# Patient Record
Sex: Female | Born: 1950
Health system: Southern US, Community
[De-identification: ages and names within clinical notes are randomized; demographics above are authoritative.]

## PROBLEM LIST (undated history)

## (undated) DIAGNOSIS — T7840XA Allergy, unspecified, initial encounter: Secondary | ICD-10-CM

## (undated) DIAGNOSIS — K219 Gastro-esophageal reflux disease without esophagitis: Secondary | ICD-10-CM

## (undated) DIAGNOSIS — M810 Age-related osteoporosis without current pathological fracture: Secondary | ICD-10-CM

## (undated) DIAGNOSIS — K648 Other hemorrhoids: Secondary | ICD-10-CM

## (undated) DIAGNOSIS — F419 Anxiety disorder, unspecified: Secondary | ICD-10-CM

## (undated) DIAGNOSIS — K760 Fatty (change of) liver, not elsewhere classified: Secondary | ICD-10-CM

## (undated) HISTORY — PX: COLONOSCOPY: SHX174

## (undated) HISTORY — DX: Allergy, unspecified, initial encounter: T78.40XA

## (undated) HISTORY — PX: ABDOMINAL HYSTERECTOMY: SHX81

## (undated) HISTORY — DX: Age-related osteoporosis without current pathological fracture: M81.0

## (undated) HISTORY — DX: Fatty (change of) liver, not elsewhere classified: K76.0

## (undated) HISTORY — DX: Other hemorrhoids: K64.8

---

## 1998-04-25 ENCOUNTER — Other Ambulatory Visit: Admission: RE | Admit: 1998-04-25 | Discharge: 1998-04-25 | Payer: Self-pay | Admitting: Gastroenterology

## 2000-05-03 ENCOUNTER — Other Ambulatory Visit: Admission: RE | Admit: 2000-05-03 | Discharge: 2000-05-03 | Payer: Self-pay | Admitting: *Deleted

## 2000-12-12 ENCOUNTER — Other Ambulatory Visit: Admission: RE | Admit: 2000-12-12 | Discharge: 2000-12-12 | Payer: Self-pay | Admitting: *Deleted

## 2017-06-23 ENCOUNTER — Emergency Department (HOSPITAL_COMMUNITY)
Admission: EM | Admit: 2017-06-23 | Discharge: 2017-06-24 | Disposition: A | Discharge status: Home / Self Care | Payer: BLUE CROSS/BLUE SHIELD | Attending: Emergency Medicine | Admitting: Emergency Medicine

## 2017-06-23 ENCOUNTER — Emergency Department (HOSPITAL_COMMUNITY): Payer: BLUE CROSS/BLUE SHIELD

## 2017-06-23 ENCOUNTER — Encounter (HOSPITAL_COMMUNITY): Payer: Self-pay | Admitting: Emergency Medicine

## 2017-06-23 DIAGNOSIS — W101XXA Fall (on)(from) sidewalk curb, initial encounter: Secondary | ICD-10-CM | POA: Insufficient documentation

## 2017-06-23 DIAGNOSIS — S99912A Unspecified injury of left ankle, initial encounter: Secondary | ICD-10-CM | POA: Diagnosis present

## 2017-06-23 DIAGNOSIS — Y998 Other external cause status: Secondary | ICD-10-CM | POA: Insufficient documentation

## 2017-06-23 DIAGNOSIS — Z87891 Personal history of nicotine dependence: Secondary | ICD-10-CM | POA: Insufficient documentation

## 2017-06-23 DIAGNOSIS — Y92480 Sidewalk as the place of occurrence of the external cause: Secondary | ICD-10-CM | POA: Insufficient documentation

## 2017-06-23 DIAGNOSIS — S82852A Displaced trimalleolar fracture of left lower leg, initial encounter for closed fracture: Secondary | ICD-10-CM | POA: Diagnosis not present

## 2017-06-23 DIAGNOSIS — W19XXXA Unspecified fall, initial encounter: Secondary | ICD-10-CM

## 2017-06-23 DIAGNOSIS — Y9301 Activity, walking, marching and hiking: Secondary | ICD-10-CM | POA: Diagnosis not present

## 2017-06-23 MED ORDER — PROPOFOL 10 MG/ML IV BOLUS
0.5000 mg/kg | Freq: Once | INTRAVENOUS | Status: DC
  Filled 2017-06-23: qty 20

## 2017-06-23 MED ORDER — MORPHINE SULFATE (PF) 4 MG/ML IV SOLN
6.0000 mg | Freq: Once | INTRAVENOUS | Status: AC
  Administered 2017-06-23: 6 mg via INTRAVENOUS
  Filled 2017-06-23: qty 2

## 2017-06-23 MED ORDER — HYDROCODONE-ACETAMINOPHEN 5-325 MG PO TABS: 1.0000 | ORAL_TABLET | ORAL | 0 refills | Status: DC | PRN

## 2017-06-23 MED ORDER — PROPOFOL 10 MG/ML IV BOLUS
INTRAVENOUS | Status: AC | PRN
  Administered 2017-06-23: 40 mg via INTRAVENOUS
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
## 2017-06-23 NOTE — Discharge Instructions (Signed)
For severe pain take norco or vicodin however realize they have the potential for addiction and it can make you sleepy and has tylenol in it.  No operating machinery while taking. If you were given medicines take as directed.  If you are on coumadin or contraceptives realize their levels and effectiveness is altered by many different medicines.  If you have any reaction (rash, tongues swelling, other) to the medicines stop taking and see a physician.    If your blood pressure was elevated in the ER make sure you follow up for management with a primary doctor or return for chest pain, shortness of breath or stroke symptoms.  Please follow up as directed and return to the ER or see a physician for new or worsening symptoms.  Thank you. Vitals:   06/23/17 2245 06/23/17 2310 06/23/17 2315 06/23/17 2320  BP: 138/83  130/84 (!) 152/92  Pulse: 84 85 89 87  Resp:  (!) 24 (!) 25 17  SpO2: 98% 95% 100% 99%  Weight:      Height:
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
## 2017-06-24 ENCOUNTER — Emergency Department (HOSPITAL_COMMUNITY): Payer: BLUE CROSS/BLUE SHIELD

## 2017-06-24 MED ORDER — HYDROCODONE-ACETAMINOPHEN 5-325 MG PO TABS
1.0000 | ORAL_TABLET | Freq: Once | ORAL | Status: AC
Start: 2017-06-24 — End: 2017-06-24
  Administered 2017-06-24: 1 via ORAL
  Filled 2017-06-24: qty 1

## 2017-07-04 NOTE — Pre-Procedure Instructions (Signed)
Lisa Spence  07/04/2017     No Pharmacies Listed   Your procedure is scheduled on Monday October 15.  Report to [facility] Admitting at 10:30 A.M.  Call this number if you have problems the morning of surgery:  [phone]   Remember:  Do not eat food or drink liquids after midnight.  Take these medicines the morning of surgery with A SIP OF WATER: hydrocodone-acetaminophen ([place]) if needed  7 days prior to surgery STOP taking any Aspirin (unless otherwise instructed by your surgeon), Aleve, Naproxen, Ibuprofen, Motrin, Advil, Goody's, BC's, all herbal medications, fish oil, and all vitamins    Do not wear jewelry, make-up or nail polish.  Do not wear lotions, powders, or perfumes, or deoderant.  Do not shave 48 hours prior to surgery.  Men may shave face and neck.  Do not bring valuables to the hospital.  [facility] is not responsible for any belongings or valuables.  Contacts, dentures or bridgework may not be worn into surgery.  Leave your suitcase in the car.  After surgery it may be brought to your room.  For patients admitted to the hospital, discharge time will be determined by your treatment team.  Patients discharged the day of surgery will not be allowed to drive home.   Special instructions:    [place]- Preparing For Surgery  Before surgery, you can play an important role. Because skin is not sterile, your skin needs to be as free of germs as possible. You can reduce the number of germs on your skin by washing with CHG (chlorahexidine gluconate) Soap before surgery.  CHG is an antiseptic cleaner which kills germs and bonds with the skin to continue killing germs even after washing.  Please do not use if you have an allergy to CHG or antibacterial soaps. If your skin becomes reddened/irritated stop using the CHG.  Do not shave (including legs and underarms) for at least 48 hours prior to first CHG shower. It is OK to shave your face.  Please  follow these instructions carefully.   1. Shower the NIGHT BEFORE SURGERY and the MORNING OF SURGERY with CHG.   2. If you chose to wash your hair, wash your hair first as usual with your normal shampoo.  3. After you shampoo, rinse your hair and body thoroughly to remove the shampoo.  4. Use CHG as you would any other liquid soap. You can apply CHG directly to the skin and wash gently with a scrungie or a clean washcloth.   5. Apply the CHG Soap to your body ONLY FROM THE NECK DOWN.  Do not use on open wounds or open sores. Avoid contact with your eyes, ears, mouth and genitals (private parts). Wash genitals (private parts) with your normal soap.  6. Wash thoroughly, paying special attention to the area where your surgery will be performed.  7. Thoroughly rinse your body with warm water from the neck down.  8. DO NOT shower/wash with your normal soap after using and rinsing off the CHG Soap.  9. Pat yourself dry with a CLEAN TOWEL.  10. Wear CLEAN PAJAMAS to bed the night before surgery, wear comfortable clothes the morning of surgery  11. Place CLEAN SHEETS on your bed the night of your first shower and DO NOT SLEEP WITH PETS.    Day of Surgery: Do not apply any deodorants/lotions. Please wear clean clothes to the hospital/surgery center.      Please read over  the following fact sheets that you were given. Coughing and Deep Breathing and MRSA Information

## 2017-07-05 ENCOUNTER — Encounter (HOSPITAL_COMMUNITY)
Admission: RE | Admit: 2017-07-05 | Discharge: 2017-07-05 | Disposition: A | Discharge status: Home / Self Care | Payer: BLUE CROSS/BLUE SHIELD | Source: Ambulatory Visit | Attending: Orthopedic Surgery | Admitting: Orthopedic Surgery

## 2017-07-05 ENCOUNTER — Encounter (HOSPITAL_COMMUNITY): Payer: Self-pay

## 2017-07-05 DIAGNOSIS — Z01818 Encounter for other preprocedural examination: Secondary | ICD-10-CM | POA: Insufficient documentation

## 2017-07-05 HISTORY — DX: Anxiety disorder, unspecified: F41.9

## 2017-07-05 HISTORY — DX: Gastro-esophageal reflux disease without esophagitis: K21.9

## 2017-07-05 LAB — CBC
HEMATOCRIT: 43.7 % (ref 36.0–46.0)
Hemoglobin: 14.6 g/dL (ref 12.0–15.0)
MCH: 29.4 pg (ref 26.0–34.0)
MCHC: 33.4 g/dL (ref 30.0–36.0)
MCV: 88.1 fL (ref 78.0–100.0)
PLATELETS: 290 10*3/uL (ref 150–400)
RBC: 4.96 MIL/uL (ref 3.87–5.11)
RDW: 12.5 % (ref 11.5–15.5)
WBC: 9.5 10*3/uL (ref 4.0–10.5)

## 2017-07-05 LAB — SURGICAL PCR SCREEN
MRSA, PCR: NEGATIVE
STAPHYLOCOCCUS AUREUS: NEGATIVE

## 2017-07-05 NOTE — Progress Notes (Signed)
PCP - Laurey Morale in [place]  Pt denies cardiac issues/cardiac hx  Patient denies shortness of breath, fever, cough and chest pain at PAT appointment   Patient verbalized understanding of instructions that were given to them at the PAT appointment. Patient was also instructed that they will need to review over the PAT instructions again at home before surgery.

## 2017-07-09 ENCOUNTER — Inpatient Hospital Stay (HOSPITAL_COMMUNITY)
Admission: RE | Admit: 2017-07-09 | Discharge: 2017-07-11 | DRG: 494 | Disposition: A | Discharge status: Home / Self Care | Payer: BLUE CROSS/BLUE SHIELD | Source: Ambulatory Visit | Attending: Orthopedic Surgery | Admitting: Orthopedic Surgery

## 2017-07-09 ENCOUNTER — Inpatient Hospital Stay (HOSPITAL_COMMUNITY): Payer: BLUE CROSS/BLUE SHIELD | Admitting: Anesthesiology

## 2017-07-09 ENCOUNTER — Encounter (HOSPITAL_COMMUNITY): Payer: Self-pay

## 2017-07-09 ENCOUNTER — Encounter (HOSPITAL_COMMUNITY)
Admission: RE | Disposition: A | Discharge status: Home / Self Care | Payer: Self-pay | Source: Ambulatory Visit | Attending: Orthopedic Surgery

## 2017-07-09 ENCOUNTER — Inpatient Hospital Stay (HOSPITAL_COMMUNITY): Payer: BLUE CROSS/BLUE SHIELD

## 2017-07-09 DIAGNOSIS — K219 Gastro-esophageal reflux disease without esophagitis: Secondary | ICD-10-CM | POA: Diagnosis present

## 2017-07-09 DIAGNOSIS — F419 Anxiety disorder, unspecified: Secondary | ICD-10-CM | POA: Diagnosis present

## 2017-07-09 DIAGNOSIS — Z419 Encounter for procedure for purposes other than remedying health state, unspecified: Secondary | ICD-10-CM

## 2017-07-09 DIAGNOSIS — W101XXA Fall (on)(from) sidewalk curb, initial encounter: Secondary | ICD-10-CM | POA: Diagnosis present

## 2017-07-09 DIAGNOSIS — M25572 Pain in left ankle and joints of left foot: Secondary | ICD-10-CM | POA: Diagnosis present

## 2017-07-09 DIAGNOSIS — S82852A Displaced trimalleolar fracture of left lower leg, initial encounter for closed fracture: Secondary | ICD-10-CM | POA: Diagnosis present

## 2017-07-09 DIAGNOSIS — Z87891 Personal history of nicotine dependence: Secondary | ICD-10-CM

## 2017-07-09 DIAGNOSIS — Z79899 Other long term (current) drug therapy: Secondary | ICD-10-CM

## 2017-07-09 HISTORY — PX: ORIF ANKLE FRACTURE: SHX5408

## 2017-07-09 LAB — CREATININE, SERUM
CREATININE: 0.84 mg/dL (ref 0.44–1.00)
GFR calc Af Amer: >60 mL/min (ref >60)

## 2017-07-09 LAB — CBC
HCT: 45.9 % (ref 36.0–46.0)
Hemoglobin: 15.7 g/dL — ABNORMAL HIGH (ref 12.0–15.0)
MCH: 30.4 pg (ref 26.0–34.0)
MCHC: 34.2 g/dL (ref 30.0–36.0)
MCV: 88.8 fL (ref 78.0–100.0)
Platelets: 260 10*3/uL (ref 150–400)
RBC: 5.17 MIL/uL — ABNORMAL HIGH (ref 3.87–5.11)
RDW: 12.2 % (ref 11.5–15.5)
WBC: 9 10*3/uL (ref 4.0–10.5)

## 2017-07-09 SURGERY — OPEN REDUCTION INTERNAL FIXATION (ORIF) ANKLE FRACTURE
Anesthesia: General | Laterality: Left

## 2017-07-09 MED ORDER — ENOXAPARIN SODIUM 40 MG/0.4ML ~~LOC~~ SOLN
40.0000 mg | SUBCUTANEOUS | Status: DC
  Administered 2017-07-10 – 2017-07-11 (×2): 40 mg via SUBCUTANEOUS
  Filled 2017-07-09 (×2): qty 0.4

## 2017-07-09 MED ORDER — MIDAZOLAM HCL 2 MG/2ML IJ SOLN
INTRAMUSCULAR | Status: AC
  Administered 2017-07-09: 2 mg via INTRAVENOUS
  Filled 2017-07-09: qty 2

## 2017-07-09 MED ORDER — DIPHENHYDRAMINE HCL 50 MG/ML IJ SOLN
INTRAMUSCULAR | Status: AC
  Filled 2017-07-09: qty 1

## 2017-07-09 MED ORDER — BUPIVACAINE HCL (PF) 0.25 % IJ SOLN
INTRAMUSCULAR | Status: AC
  Filled 2017-07-09: qty 30

## 2017-07-09 MED ORDER — FENTANYL CITRATE (PF) 100 MCG/2ML IJ SOLN
100.0000 ug | Freq: Once | INTRAMUSCULAR | Status: AC
  Administered 2017-07-09: 100 ug via INTRAVENOUS

## 2017-07-09 MED ORDER — MIDAZOLAM HCL 2 MG/2ML IJ SOLN
2.0000 mg | Freq: Once | INTRAMUSCULAR | Status: AC
  Administered 2017-07-09: 2 mg via INTRAVENOUS

## 2017-07-09 MED ORDER — VITAMIN C 500 MG PO TABS
500.0000 mg | ORAL_TABLET | Freq: Every day | ORAL | Status: DC
  Administered 2017-07-10 – 2017-07-11 (×2): 500 mg via ORAL
  Filled 2017-07-09 (×2): qty 1

## 2017-07-09 MED ORDER — TRAMADOL HCL 50 MG PO TABS
50.0000 mg | ORAL_TABLET | Freq: Four times a day (QID) | ORAL | Status: DC | PRN
  Filled 2017-07-09: qty 1

## 2017-07-09 MED ORDER — PROPOFOL 10 MG/ML IV BOLUS
INTRAVENOUS | Status: AC
  Filled 2017-07-09: qty 20

## 2017-07-09 MED ORDER — MORPHINE SULFATE (PF) 4 MG/ML IV SOLN
2.0000 mg | INTRAVENOUS | Status: DC | PRN
  Administered 2017-07-10: 2 mg via INTRAVENOUS
  Filled 2017-07-09: qty 1

## 2017-07-09 MED ORDER — TRAMADOL HCL 50 MG PO TABS
50.0000 mg | ORAL_TABLET | Freq: Four times a day (QID) | ORAL | Status: DC | PRN
  Administered 2017-07-09: 100 mg via ORAL
  Filled 2017-07-09: qty 1

## 2017-07-09 MED ORDER — ONDANSETRON HCL 4 MG/2ML IJ SOLN
INTRAMUSCULAR | Status: DC | PRN
  Administered 2017-07-09: 4 mg via INTRAVENOUS

## 2017-07-09 MED ORDER — CHLORHEXIDINE GLUCONATE 4 % EX LIQD: 60.0000 mL | Freq: Once | CUTANEOUS | Status: DC

## 2017-07-09 MED ORDER — PANTOPRAZOLE SODIUM 20 MG PO TBEC: 20.0000 mg | DELAYED_RELEASE_TABLET | Freq: Every day | ORAL | Status: DC | PRN

## 2017-07-09 MED ORDER — ACETAMINOPHEN 325 MG PO TABS: 650.0000 mg | ORAL_TABLET | Freq: Four times a day (QID) | ORAL | Status: DC | PRN

## 2017-07-09 MED ORDER — HYDROCODONE-ACETAMINOPHEN 5-325 MG PO TABS
1.0000 | ORAL_TABLET | ORAL | Status: DC | PRN
  Administered 2017-07-10 – 2017-07-11 (×7): 2 via ORAL
  Filled 2017-07-09 (×7): qty 2

## 2017-07-09 MED ORDER — CEFAZOLIN SODIUM-DEXTROSE 2-4 GM/100ML-% IV SOLN
INTRAVENOUS | Status: AC
  Filled 2017-07-09: qty 100

## 2017-07-09 MED ORDER — BUPIVACAINE-EPINEPHRINE (PF) 0.5% -1:200000 IJ SOLN
INTRAMUSCULAR | Status: DC | PRN
  Administered 2017-07-09: 30 mL via PERINEURAL

## 2017-07-09 MED ORDER — ONDANSETRON HCL 4 MG/2ML IJ SOLN: 4.0000 mg | Freq: Four times a day (QID) | INTRAMUSCULAR | Status: DC | PRN

## 2017-07-09 MED ORDER — MIDAZOLAM HCL 2 MG/2ML IJ SOLN
INTRAMUSCULAR | Status: AC
  Filled 2017-07-09: qty 2

## 2017-07-09 MED ORDER — SENNA 8.6 MG PO TABS
1.0000 | ORAL_TABLET | Freq: Two times a day (BID) | ORAL | Status: DC
  Administered 2017-07-09 – 2017-07-11 (×4): 8.6 mg via ORAL
  Filled 2017-07-09 (×4): qty 1

## 2017-07-09 MED ORDER — ONDANSETRON HCL 4 MG PO TABS: 4.0000 mg | ORAL_TABLET | Freq: Four times a day (QID) | ORAL | Status: DC | PRN

## 2017-07-09 MED ORDER — MORPHINE SULFATE (PF) 2 MG/ML IV SOLN: 2.0000 mg | INTRAVENOUS | Status: DC | PRN

## 2017-07-09 MED ORDER — DIPHENHYDRAMINE HCL 50 MG/ML IJ SOLN
INTRAMUSCULAR | Status: DC | PRN
  Administered 2017-07-09: 12.5 mg via INTRAVENOUS

## 2017-07-09 MED ORDER — FENTANYL CITRATE (PF) 100 MCG/2ML IJ SOLN
INTRAMUSCULAR | Status: AC
  Administered 2017-07-09: 100 ug via INTRAVENOUS
  Filled 2017-07-09: qty 2

## 2017-07-09 MED ORDER — PROPOFOL 10 MG/ML IV BOLUS
INTRAVENOUS | Status: DC | PRN
  Administered 2017-07-09: 180 mg via INTRAVENOUS

## 2017-07-09 MED ORDER — ONDANSETRON HCL 4 MG/2ML IJ SOLN
INTRAMUSCULAR | Status: AC
  Filled 2017-07-09: qty 2

## 2017-07-09 MED ORDER — ACETAMINOPHEN 650 MG RE SUPP: 650.0000 mg | Freq: Four times a day (QID) | RECTAL | Status: DC | PRN

## 2017-07-09 MED ORDER — DEXAMETHASONE SODIUM PHOSPHATE 10 MG/ML IJ SOLN
INTRAMUSCULAR | Status: DC | PRN
  Administered 2017-07-09: 10 mg via INTRAVENOUS

## 2017-07-09 MED ORDER — LACTATED RINGERS IV SOLN
INTRAVENOUS | Status: DC
  Administered 2017-07-09: 11:00:00 via INTRAVENOUS

## 2017-07-09 MED ORDER — HYDROMORPHONE HCL 1 MG/ML IJ SOLN: 0.2500 mg | INTRAMUSCULAR | Status: DC | PRN

## 2017-07-09 MED ORDER — CEFAZOLIN SODIUM-DEXTROSE 2-4 GM/100ML-% IV SOLN
2.0000 g | INTRAVENOUS | Status: AC
  Administered 2017-07-09: 2 g via INTRAVENOUS

## 2017-07-09 MED ORDER — DEXAMETHASONE SODIUM PHOSPHATE 10 MG/ML IJ SOLN
INTRAMUSCULAR | Status: AC
  Filled 2017-07-09: qty 1

## 2017-07-09 MED ORDER — CLONAZEPAM 0.5 MG PO TABS
0.5000 mg | ORAL_TABLET | Freq: Two times a day (BID) | ORAL | Status: DC | PRN
  Administered 2017-07-09 – 2017-07-11 (×4): 1 mg via ORAL
  Filled 2017-07-09 (×4): qty 2

## 2017-07-09 MED ORDER — PROMETHAZINE HCL 25 MG/ML IJ SOLN
6.2500 mg | INTRAMUSCULAR | Status: DC | PRN
Start: 2017-07-09 — End: 2017-07-09

## 2017-07-09 MED ORDER — FENTANYL CITRATE (PF) 250 MCG/5ML IJ SOLN
INTRAMUSCULAR | Status: AC
  Filled 2017-07-09: qty 5

## 2017-07-09 SURGICAL SUPPLY — 66 items
BANDAGE ACE 4X5 VEL STRL LF (GAUZE/BANDAGES/DRESSINGS) ×2 IMPLANT
BANDAGE ACE 6X5 VEL STRL LF (GAUZE/BANDAGES/DRESSINGS) ×2 IMPLANT
BANDAGE ESMARK 6X9 LF (GAUZE/BANDAGES/DRESSINGS) IMPLANT
BIT DRILL 2.5X110 QC LCP DISP (BIT) ×2 IMPLANT
BIT DRILL CANN 2.7X625 NONSTRL (BIT) ×2 IMPLANT
BNDG CMPR 9X6 STRL LF SNTH (GAUZE/BANDAGES/DRESSINGS)
BNDG COHESIVE 4X5 TAN STRL (GAUZE/BANDAGES/DRESSINGS) ×3 IMPLANT
BNDG ESMARK 6X9 LF (GAUZE/BANDAGES/DRESSINGS)
CANISTER SUCT 3000ML PPV (MISCELLANEOUS) ×3 IMPLANT
COVER SURGICAL LIGHT HANDLE (MISCELLANEOUS) ×3 IMPLANT
CUFF TOURNIQUET SINGLE 34IN LL (TOURNIQUET CUFF) ×2 IMPLANT
DRAPE C-ARM 42X72 X-RAY (DRAPES) ×3 IMPLANT
DRAPE C-ARMOR (DRAPES) ×3 IMPLANT
DRAPE U-SHAPE 47X51 STRL (DRAPES) ×3 IMPLANT
DRSG ADAPTIC 3X8 NADH LF (GAUZE/BANDAGES/DRESSINGS) ×3 IMPLANT
DRSG PAD ABDOMINAL 8X10 ST (GAUZE/BANDAGES/DRESSINGS) ×5 IMPLANT
DURAPREP 26ML APPLICATOR (WOUND CARE) ×3 IMPLANT
ELECT REM PT RETURN 9FT ADLT (ELECTROSURGICAL) ×3
ELECTRODE REM PT RTRN 9FT ADLT (ELECTROSURGICAL) ×1 IMPLANT
GAUZE SPONGE 4X4 12PLY STRL (GAUZE/BANDAGES/DRESSINGS) ×3 IMPLANT
GLOVE BIO SURGEON STRL SZ7.5 (GLOVE) ×3 IMPLANT
GLOVE BIOGEL PI IND STRL 7.0 (GLOVE) IMPLANT
GLOVE BIOGEL PI IND STRL 8 (GLOVE) ×1 IMPLANT
GLOVE BIOGEL PI INDICATOR 7.0 (GLOVE) ×2
GLOVE BIOGEL PI INDICATOR 8 (GLOVE) ×2
GLOVE SURG SS PI 6.5 STRL IVOR (GLOVE) ×2 IMPLANT
GOWN STRL REUS W/ TWL LRG LVL3 (GOWN DISPOSABLE) ×2 IMPLANT
GOWN STRL REUS W/ TWL XL LVL3 (GOWN DISPOSABLE) ×1 IMPLANT
GOWN STRL REUS W/TWL LRG LVL3 (GOWN DISPOSABLE) ×6
GOWN STRL REUS W/TWL XL LVL3 (GOWN DISPOSABLE) ×3
GUIDEWIRE THREADED 150MM (WIRE) ×2 IMPLANT
KIT BASIN OR (CUSTOM PROCEDURE TRAY) ×3 IMPLANT
KIT ROOM TURNOVER OR (KITS) ×3 IMPLANT
NDL HYPO 25GX1X1/2 BEV (NEEDLE) IMPLANT
NEEDLE HYPO 25GX1X1/2 BEV (NEEDLE) IMPLANT
NS IRRIG 1000ML POUR BTL (IV SOLUTION) ×3 IMPLANT
PACK ORTHO EXTREMITY (CUSTOM PROCEDURE TRAY) ×3 IMPLANT
PAD ARMBOARD 7.5X6 YLW CONV (MISCELLANEOUS) ×6 IMPLANT
PAD CAST 4YDX4 CTTN HI CHSV (CAST SUPPLIES) ×2 IMPLANT
PADDING CAST COTTON 4X4 STRL (CAST SUPPLIES) ×6
PADDING CAST COTTON 6X4 STRL (CAST SUPPLIES) ×3 IMPLANT
PLATE LCP 3.5 1/3 TUB 8HX93 (Plate) ×2 IMPLANT
SCREW CANC FT ST SFS 4X16 (Screw) ×2 IMPLANT
SCREW CANC FT/18 4.0 (Screw) ×2 IMPLANT
SCREW CORTEX 3.5 12MM (Screw) ×6 IMPLANT
SCREW CORTEX 3.5 18MM (Screw) ×2 IMPLANT
SCREW CORTEX 3.5 20MM (Screw) ×2 IMPLANT
SCREW LOCK CORT ST 3.5X12 (Screw) IMPLANT
SCREW LOCK CORT ST 3.5X18 (Screw) IMPLANT
SCREW LOCK CORT ST 3.5X20 (Screw) IMPLANT
SCREW LOCK T15 FT 16X3.5X2.9X (Screw) IMPLANT
SCREW LOCKING 3.5X16 (Screw) ×3 IMPLANT
SCREW SHORT THREAD 4.0X40 (Screw) ×4 IMPLANT
SPONGE LAP 18X18 X RAY DECT (DISPOSABLE) IMPLANT
SUCTION FRAZIER HANDLE 10FR (MISCELLANEOUS) ×2
SUCTION TUBE FRAZIER 10FR DISP (MISCELLANEOUS) ×1 IMPLANT
SUT ETHILON 3 0 PS 1 (SUTURE) ×7 IMPLANT
SUT VIC AB 0 CT1 27 (SUTURE) ×3
SUT VIC AB 0 CT1 27XBRD ANBCTR (SUTURE) IMPLANT
SUT VIC AB 2-0 CT1 27 (SUTURE) ×6
SUT VIC AB 2-0 CT1 TAPERPNT 27 (SUTURE) ×1 IMPLANT
SYR CONTROL 10ML LL (SYRINGE) IMPLANT
TOWEL OR 17X24 6PK STRL BLUE (TOWEL DISPOSABLE) ×3 IMPLANT
TOWEL OR 17X26 10 PK STRL BLUE (TOWEL DISPOSABLE) ×6 IMPLANT
TUBE CONNECTING 12'X1/4 (SUCTIONS) ×1
TUBE CONNECTING 12X1/4 (SUCTIONS) ×2 IMPLANT

## 2017-07-09 NOTE — Anesthesia Procedure Notes (Signed)
Procedure Name: LMA Insertion Date/Time: 07/09/2017 12:35 PM Performed by: Sampson Si E Pre-anesthesia Checklist: Patient identified, Emergency Drugs available, Suction available and Patient being monitored Patient Re-evaluated:Patient Re-evaluated prior to induction Oxygen Delivery Method: Circle System Utilized Preoxygenation: Pre-oxygenation with 100% oxygen Induction Type: IV induction Ventilation: Mask ventilation without difficulty LMA: LMA inserted LMA Size: 4.0 Number of attempts: 1 Placement Confirmation: positive ETCO2 Tube secured with: Tape Dental Injury: Teeth and Oropharynx as per pre-operative assessment
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
## 2017-07-09 NOTE — Transfer of Care (Signed)
Immediate Anesthesia Transfer of Care Note  Patient: Lisa Spence  Procedure(s) Performed: OPEN REDUCTION INTERNAL FIXATION (ORIF) ANKLE FRACTURE (Left )  Patient Location: PACU  Anesthesia Type:General  Level of Consciousness: drowsy and patient cooperative  Airway & Oxygen Therapy: Patient Spontanous Breathing and Patient connected to nasal cannula oxygen  Post-op Assessment: Report given to RN  Post vital signs: Reviewed and stable  Last Vitals:  Vitals:   07/09/17 1205 07/09/17 1210  BP: (!) 142/73 (!) 163/91  Pulse: 86 87  Resp: 11 16  Temp:    SpO2: 97% 100%    Last Pain:  Vitals:   07/09/17 1019  TempSrc: Oral         Complications: No apparent anesthesia complications

## 2017-07-09 NOTE — Anesthesia Procedure Notes (Signed)
Anesthesia Regional Block: Popliteal block   Pre-Anesthetic Checklist: ,, timeout performed, Correct Patient, Correct Site, Correct Laterality, Correct Procedure, Correct Position, site marked, Risks and benefits discussed,  Surgical consent,  Pre-op evaluation,  At surgeon's request and post-op pain management  Laterality: Left  Prep: chloraprep       Needles:  Injection technique: Single-shot  Needle Type: Echogenic Stimulator Needle          Additional Needles:   Narrative:  Start time: 07/09/2017 11:56 AM End time: 07/09/2017 12:06 PM Injection made incrementally with aspirations every 5 mL.  Performed by: Personally  Anesthesiologist: Duane Boston  Additional Notes: A functioning IV was confirmed and monitors were applied.  Sterile prep and drape, hand hygiene and sterile gloves were used.  Negative aspiration and test dose prior to incremental administration of local anesthetic. The patient tolerated the procedure well.Ultrasound  guidance: relevant anatomy identified, needle position confirmed, local anesthetic spread visualized around nerve(s), vascular puncture avoided.  Image printed for medical record. ACB supplement.

## 2017-07-09 NOTE — Brief Op Note (Signed)
07/09/2017  2:18 PM  PATIENT:  Lisa Spence  66 y.o. female  PRE-OPERATIVE DIAGNOSIS:  Left trimalleolar ankle fracture  POST-OPERATIVE DIAGNOSIS:  Left trimalleolar ankle fracture  PROCEDURE:  Procedure(s) with comments: OPEN REDUCTION INTERNAL FIXATION (ORIF) ANKLE FRACTURE (Left) - 90 mins  SURGEON:  Surgeon(s) and Role:    * Stann Mainland, Elly Modena, MD - Primary  PHYSICIAN ASSISTANT:   ASSISTANTS: none   ANESTHESIA:   regional and general  EBL:  Total I/O In: 700 [I.V.:700] Out: 10 [Blood:10]  BLOOD ADMINISTERED:none  DRAINS: none   LOCAL MEDICATIONS USED:  NONE  SPECIMEN:  No Specimen  DISPOSITION OF SPECIMEN:  N/A  COUNTS:  YES  TOURNIQUET:   Total Tourniquet Time Documented: Thigh (Left) - 69 minutes Total: Thigh (Left) - 69 minutes   DICTATION: .Note written in EPIC  PLAN OF CARE: Admit for overnight observation  PATIENT DISPOSITION:  PACU - hemodynamically stable.   Delay start of Pharmacological VTE agent (>24hrs) due to surgical blood loss or risk of bleeding: not applicable

## 2017-07-09 NOTE — Anesthesia Postprocedure Evaluation (Signed)
Anesthesia Post Note  Patient: DEANDRA GOERING  Procedure(s) Performed: OPEN REDUCTION INTERNAL FIXATION (ORIF) ANKLE FRACTURE (Left )     Patient location during evaluation: PACU Anesthesia Type: General Level of consciousness: sedated Pain management: pain level controlled Vital Signs Assessment: post-procedure vital signs reviewed and stable Respiratory status: spontaneous breathing and respiratory function stable Cardiovascular status: stable Postop Assessment: no apparent nausea or vomiting Anesthetic complications: no    Last Vitals:  Vitals:   07/09/17 1210 07/09/17 1420  BP: (!) 163/91   Pulse: 87   Resp: 16   Temp:  36.7 C  SpO2: 100%     Last Pain:  Vitals:   07/09/17 1019  TempSrc: Oral                 Alford Gamero DANIEL

## 2017-07-09 NOTE — H&P (Signed)
ORTHOPAEDIC H and P  REQUESTING PHYSICIAN: Nicholes Stairs, MD  PCP:  Patient, No Pcp Per  Chief Complaint: Left ankle fracture  HPI: Lisa MONCEAUX is a 66 y.o. female who complains of left ankle fracture following a fall approximately 2 weeks ago.  She sustained a left trimalleolar fracture/dislocation.  This was treated with CRED and splinting.  She followed up with me in my office and was found to be in good alignment and we recommended delayed fixation to allow soft tissue rest.  She is here today for the surgery.  We have reviewed images and our operative plan in my office and there are no changes to her prior HPI.  She denies any new complaints today.  Past Medical History:  Diagnosis Date  . Anxiety   . GERD (gastroesophageal reflux disease)    Past Surgical History:  Procedure Laterality Date  . ABDOMINAL HYSTERECTOMY    . COLONOSCOPY     Social History   Social History  . Marital status: Single    Spouse name: N/A  . Number of children: N/A  . Years of education: N/A   Social History Main Topics  . Smoking status: Former Research scientist (life sciences)  . Smokeless tobacco: Never Used  . Alcohol use No     Comment: no longer drinks, drank 6 glasses wine/week stopped in 2007  . Drug use: No  . Sexual activity: No   Other Topics Concern  . None   Social History Narrative  . None   History reviewed. No pertinent family history. No Known Allergies Prior to Admission medications   Medication Sig Start Date End Date Taking? Authorizing Provider  Ascorbic Acid (VITAMIN C PO) Take 1 tablet by mouth daily.   Yes [provider]  clonazePAM (KLONOPIN) 0.5 MG tablet Take 0.5 mg by mouth 2 (two) times daily as needed for anxiety. 1-2 tablets twice daily as needed   Yes [provider]  HYDROcodone-acetaminophen (NORCO) 5-325 MG tablet Take 1-2 tablets by mouth every 4 (four) hours as needed. 06/23/17  Yes Elnora Morrison, MD  pantoprazole (PROTONIX) 20 MG tablet Take  20 mg by mouth daily as needed for heartburn.    Yes [provider]  Probiotic Product (PROBIOTIC PO) Take 1 tablet by mouth daily.   Yes [provider]  traMADol (ULTRAM) 50 MG tablet Take 50 mg by mouth every 6 (six) hours as needed. Takes 1-2 tablets every 6 hours as needed   Yes [provider]   No results found.  Positive ROS: All other systems have been reviewed and were otherwise negative with the exception of those mentioned in the HPI and as above.  Physical Exam: General: Alert, no acute distress Cardiovascular: No pedal edema Respiratory: No cyanosis, no use of accessory musculature GI: No organomegaly, abdomen is soft and non-tender Skin: No lesions in the area of chief complaint Neurologic: Sensation intact distally Psychiatric: Patient is competent for consent with normal mood and affect Lymphatic: No axillary or cervical lymphadenopathy  MUSCULOSKELETAL:  LLE- splint taken down anteriorly, +wrinkle sign noted and skin appropriate for surgery.  +SILT sp/dp/sur/saph/TN, motor in tact with TA/GSC/FHL/EHL, 2+ DP pulse  Assessment: Closed left ankle trimalleolar ankle fractur  Plan: - plan for OR today for ORIF - will admit to obs post op for pain control and PT - NWB post op - The risks, benefits, and alternatives were discussed with the patient. There are risks associated with the surgery including, but not limited to,  problems with anesthesia (death), infection, differences in leg length/angulation/rotation, fracture of bones, loosening or failure of implants, malunion, nonunion, hematoma (blood accumulation) which may require surgical drainage, blood clots, pulmonary embolism, nerve injury, and blood vessel injury. The patient understands these risks and elects to proceed.     Nicholes Stairs, MD Cell [phone]    07/09/2017 12:05 PM

## 2017-07-09 NOTE — Anesthesia Preprocedure Evaluation (Signed)
Anesthesia Evaluation  Patient identified by MRN, date of birth, ID band Patient awake    Reviewed: Allergy & Precautions, NPO status , Patient's Chart, lab work & pertinent test results  Airway Mallampati: II  TM Distance: >3 FB Neck ROM: Full    Dental no notable dental hx. (+) Dental Advisory Given   Pulmonary neg pulmonary ROS, former smoker,    Pulmonary exam normal        Cardiovascular negative cardio ROS Normal cardiovascular exam     Neuro/Psych PSYCHIATRIC DISORDERS Anxiety negative neurological ROS     GI/Hepatic Neg liver ROS, GERD  ,  Endo/Other  negative endocrine ROS  Renal/GU negative Renal ROS  negative genitourinary   Musculoskeletal negative musculoskeletal ROS (+)   Abdominal   Peds negative pediatric ROS (+)  Hematology negative hematology ROS (+)   Anesthesia Other Findings   Reproductive/Obstetrics negative OB ROS                             Anesthesia Physical Anesthesia Plan  ASA: II  Anesthesia Plan: General   Post-op Pain Management:    Induction: Intravenous  PONV Risk Score and Plan: 3 and Ondansetron, Dexamethasone and Diphenhydramine  Airway Management Planned: LMA  Additional Equipment:   Intra-op Plan:   Post-operative Plan: Extubation in OR  Informed Consent: I have reviewed the patients History and Physical, chart, labs and discussed the procedure including the risks, benefits and alternatives for the proposed anesthesia with the patient or authorized representative who has indicated his/her understanding and acceptance.   Dental advisory given  Plan Discussed with: CRNA, Anesthesiologist and Surgeon  Anesthesia Plan Comments:         Anesthesia Quick Evaluation

## 2017-07-10 LAB — CBC
HCT: 41.8 % (ref 36.0–46.0)
HEMOGLOBIN: 13.8 g/dL (ref 12.0–15.0)
MCH: 29.1 pg (ref 26.0–34.0)
MCHC: 33 g/dL (ref 30.0–36.0)
MCV: 88.2 fL (ref 78.0–100.0)
PLATELETS: 297 10*3/uL (ref 150–400)
RBC: 4.74 MIL/uL (ref 3.87–5.11)
RDW: 12.2 % (ref 11.5–15.5)
WBC: 12.8 10*3/uL — ABNORMAL HIGH (ref 4.0–10.5)

## 2017-07-10 LAB — HIV ANTIBODY (ROUTINE TESTING W REFLEX): HIV Screen 4th Generation wRfx: NONREACTIVE

## 2017-07-10 NOTE — Evaluation (Addendum)
Physical Therapy Evaluation and Discharge Patient Details Name: Lisa Spence MRN: [id number] DOB: May 01, 1951 Today's Date: 07/10/2017   History of Present Illness  Pt is a 66 y.o. female s/p left ankle ORIF due to a left trimalleolar fracture/dislocation from a fall. PMH: Anxiety, GERD.   Clinical Impression  Patient evaluated by Physical Therapy with no further acute PT needs identified. All education has been completed and the patient has no further questions. At the time of PT eval pt was able to negotiate around room with crutches without difficulty. She reports using crutches for the past 2 weeks at home and states she has been managing well. Has been sitting and bumping up on ~3 stairs to get from one part of the house to another - has ramp and wheelchair to enter home. Pt was instructed on negotiating stairs backwards with crutches as well - pt was able to complete with close guard only for safety; no physical assist required. As pt has been managing well at home maintaining a NWB status, and is not requiring assist for mobility post-op, feel she is appropriate to return home from a PT standpoint until the MD clears her for outpatient physical therapy follow-up. See below for any follow-up Physical Therapy or equipment needs. PT is signing off. Thank you for this referral.     Follow Up Recommendations Outpatient PT (When appropriate per post-op protocol. MD to order.)    Equipment Recommendations  None recommended by PT    Recommendations for Other Services       Precautions / Restrictions Precautions Precautions: Fall Restrictions Weight Bearing Restrictions: Yes LLE Weight Bearing: Non weight bearing      Mobility  Bed Mobility Overal bed mobility: Modified Independent             General bed mobility comments: No assist required. Pt transitioned to EOB without rails, and HOB flat to simulate home environment.   Transfers Overall transfer level: Modified  independent Equipment used: Crutches Transfers: Sit to/from Stand Sit to Stand: Modified independent (Device/Increase time)         General transfer comment: Pt able to maintain NWB during transfer. Balanced on RLE well while positioning crutches. No assist required, and no unsteadiness noted.   Ambulation/Gait Ambulation/Gait assistance: Modified independent (Device/Increase time) Ambulation Distance (Feet): 25 Feet Assistive device: Crutches Gait Pattern/deviations: Step-to pattern Gait velocity: Decreased Gait velocity interpretation: Below normal speed for age/gender General Gait Details: Pt about to ambulate around room well with crutches. No LOB noted. No assist required.   Stairs Stairs: Yes Stairs assistance: Min guard Stair Management: With crutches;Backwards Number of Stairs: 3 (1x3) General stair comments: VC's for sequencing and general safety with stair negotiation. Pt reports she has been sitting down and bumping up to top step, and has been able to transition to sitting position without weight bearing at all. We practiced negotiating stairs with crutches and she was able to complete well.   Wheelchair Mobility    Modified Rankin (Stroke Patients Only)       Balance Overall balance assessment: Needs assistance Sitting-balance support: No upper extremity supported;Feet supported Sitting balance-Leahy Scale: Good     Standing balance support: Bilateral upper extremity supported;During functional activity Standing balance-Leahy Scale: Fair                               Pertinent Vitals/Pain Pain Assessment: No/denies pain (Reports still numb from knee down)  Home Living Family/patient expects to be discharged to:: Private residence Living Arrangements: Non-relatives/Friends Available Help at Discharge: Friend(s);Family;Available 24 hours/day Type of Home: House Home Access: Ramped entrance     Home Layout: Two level Home Equipment:  Crutches;Shower seat;Wheelchair - manual      Prior Function Level of Independence: Independent with assistive device(s)         Comments: Pt wroks full time and used crutches for two weeks PTA.     Hand Dominance   Dominant Hand: Right    Extremity/Trunk Assessment   Upper Extremity Assessment Upper Extremity Assessment: Overall WFL for tasks assessed    Lower Extremity Assessment Lower Extremity Assessment: LLE deficits/detail LLE: Unable to fully assess due to immobilization    Cervical / Trunk Assessment Cervical / Trunk Assessment: Normal  Communication   Communication: No difficulties  Cognition Arousal/Alertness: Awake/alert Behavior During Therapy: WFL for tasks assessed/performed Overall Cognitive Status: Within Functional Limits for tasks assessed                                        General Comments General comments (skin integrity, edema, etc.): Pt has been managing her ankle fracture for 2 weeks prior to surgery.     Exercises     Assessment/Plan    PT Assessment Patent does not need any further PT services  PT Problem List         PT Treatment Interventions      PT Goals (Current goals can be found in the Care Plan section)  Acute Rehab PT Goals Patient Stated Goal: Be able to walk her dog again PT Goal Formulation: All assessment and education complete, DC therapy    Frequency     Barriers to discharge        Co-evaluation               AM-PAC PT "6 Clicks" Daily Activity  Outcome Measure Difficulty turning over in bed (including adjusting bedclothes, sheets and blankets)?: None Difficulty moving from lying on back to sitting on the side of the bed? : None Difficulty sitting down on and standing up from a chair with arms (e.g., wheelchair, bedside commode, etc,.)?: None Help needed moving to and from a bed to chair (including a wheelchair)?: None Help needed walking in hospital room?: None Help needed  climbing 3-5 steps with a railing? : A Little 6 Click Score: 23    End of Session Equipment Utilized During Treatment: Gait belt Activity Tolerance: Patient tolerated treatment well Patient left: in chair;with call bell/phone within reach Nurse Communication: Mobility status PT Visit Diagnosis: Difficulty in walking, not elsewhere classified (R26.2)    Time: 6387-5643 PT Time Calculation (min) (ACUTE ONLY): 34 min   Charges:   PT Evaluation $PT Eval Moderate Complexity: 1 Mod PT Treatments $Gait Training: 8-22 mins   PT G Codes:        Rolinda Roan, PT, DPT Acute Rehabilitation Services Pager: [phone] 07/10/2017, 12:37 PM

## 2017-07-10 NOTE — Progress Notes (Signed)
   Subjective:  Patient reports pain as mild.  PNB still active.  Very comfortable.  No CP/SOB.  No n/v.  Objective:   VITALS:   Vitals:   07/09/17 1951 07/10/17 0337 07/10/17 0724 07/10/17 1108  BP: 130/73 114/78 123/73 114/73  Pulse: 87 74 80 89  Resp: 18 18 18 16   Temp: 98.2 F (36.8 C) 98.1 F (36.7 C) 98.4 F (36.9 C) 98.5 F (36.9 C)  TempSrc: Oral Oral    SpO2: 97% 97% 100% 100%  Weight:       LLE- Splint in place and c/d/i, no drainage Toes wwp Focal Neuro exam not able 2/2 PNB in full effect  Lab Results  Component Value Date   WBC 12.8 (H) 07/10/2017   HGB 13.8 07/10/2017   HCT 41.8 07/10/2017   MCV 88.2 07/10/2017   PLT 297 07/10/2017   BMET    Component Value Date/Time   CREATININE 0.84 07/09/2017 1527   GFRNONAA >60 07/09/2017 1527   GFRAA >60 07/09/2017 1527     Assessment/Plan: 1 Day Post-Op   Active Problems:   Left trimalleolar fracture, closed, initial encounter   Up with therapy NWB LLE lovenox and SCD for dvt ppx in house, will dc home on bid asa Follow exam for resolution of nerve block, Pain control Likely dc home tomorrow   Nicholes Stairs 07/10/2017, 12:39 PM   Geralynn Rile, MD [phone]

## 2017-07-10 NOTE — Therapy (Signed)
Occupational Therapy Evaluation Patient Details Name: Lisa Spence MRN: [id number] DOB: 1951/08/28 Today's Date: 07/10/2017    History of Present Illness Pt is a 66 y.o. female s/p left ankle ORIF due to a left trimalleolar fracture/dislocation from a fall. PMH: Anxiety, GERD.    Clinical Impression   Pt reports being independent in ADLs and was very active prior to her fall two weeks ago. Pt has been using crutches for the past two weeks for functional mobility due to fracture. Currently, pt requires min assist for LB ADLs and min guard during functional mobility with crutches. Pt reports she plans to live with friends upon d.c. Pt would benefit from acute OT services to increase independence with shower transfers. OT will follow acutely.     Follow Up Recommendations  No OT follow up;Supervision - Intermittent    Equipment Recommendations  None recommended by OT (Pt reports having all required DME )    Recommendations for Other Services       Precautions / Restrictions Precautions Precautions: Fall Restrictions Weight Bearing Restrictions: Yes LLE Weight Bearing: Non weight bearing      Mobility Bed Mobility               General bed mobility comments: Pt sitting in chair upon arrival.   Transfers Overall transfer level: Needs assistance Equipment used: Crutches Transfers: Sit to/from Stand Sit to Stand: Supervision         General transfer comment: Pt able to maintain NWB during transfer    Balance Overall balance assessment: Needs assistance Sitting-balance support: No upper extremity supported;Feet supported Sitting balance-Leahy Scale: Good     Standing balance support: Bilateral upper extremity supported;During functional activity Standing balance-Leahy Scale: Fair                             ADL either performed or assessed with clinical judgement   ADL Overall ADL's : Needs assistance/impaired     Grooming: Oral  care;Supervision/safety;Set up;Standing   Upper Body Bathing: Supervision/ safety;Set up;Sitting   Lower Body Bathing: Minimal assistance;Sit to/from stand   Upper Body Dressing : Supervision/safety;Set up;Sitting   Lower Body Dressing: Minimal assistance;Sit to/from stand   Toilet Transfer: Supervision/safety;Set up;Ambulation;Comfort height toilet;Grab bars (Crutches )   Toileting- Clothing Manipulation and Hygiene: Modified independent     Tub/Shower Transfer Details (indicate cue type and reason): Pt reports that she plans to sponge bathe initially upon d/c. Pt reports she has purchased all the self care items needed for a sponge bath. Pt agreeable to educated on shower transfer technique as she will be NWB for several weeks.  Functional mobility during ADLs: Min guard (Crutches ) General ADL Comments: Pt educated on compensatory techniques and on use of a reacher to assist with LB ADLs. Pt able to maintain NWB status during functional mobility with crutches      Vision         Perception     Praxis      Pertinent Vitals/Pain Pain Assessment: No/denies pain     Hand Dominance Right   Extremity/Trunk Assessment Upper Extremity Assessment Upper Extremity Assessment: Overall WFL for tasks assessed   Lower Extremity Assessment Lower Extremity Assessment: Defer to PT evaluation   Cervical / Trunk Assessment Cervical / Trunk Assessment: Normal   Communication Communication Communication: No difficulties   Cognition Arousal/Alertness: Awake/alert Behavior During Therapy: WFL for tasks assessed/performed Overall Cognitive Status: Within Functional Limits for tasks assessed  General Comments  Pt has been managing her ankle fracture for 2 weeks prior to surgery.     Exercises     Shoulder Instructions      Home Living Family/patient expects to be discharged to:: Private residence Living Arrangements:  Non-relatives/Friends Available Help at Discharge: Friend(s);Family;Available 24 hours/day Type of Home: House             Bathroom Shower/Tub: Walk-in shower;Tub/shower unit;Curtain   Bathroom Toilet: Handicapped height Bathroom Accessibility: Yes How Accessible: Accessible via walker Home Equipment: Crutches;Shower seat          Prior Functioning/Environment Level of Independence: Independent with assistive device(s)        Comments: Pt wroks full time and used crutches for two weeks PTA.        OT Problem List: Decreased knowledge of precautions;Decreased knowledge of use of DME or AE;Decreased coordination      OT Treatment/Interventions: Self-care/ADL training;DME and/or AE instruction;Therapeutic activities;Patient/family education;Balance training    OT Goals(Current goals can be found in the care plan section) Acute Rehab OT Goals Patient Stated Goal: To get better OT Goal Formulation: With patient Time For Goal Achievement: 07/24/17 Potential to Achieve Goals: Good ADL Goals Pt Will Perform Tub/Shower Transfer:  (crutches)  OT Frequency: Min 2X/week   Barriers to D/C:            Co-evaluation              AM-PAC PT "6 Clicks" Daily Activity     Outcome Measure Help from another person eating meals?: None Help from another person taking care of personal grooming?: None Help from another person toileting, which includes using toliet, bedpan, or urinal?: None Help from another person bathing (including washing, rinsing, drying)?: A Little Help from another person to put on and taking off regular upper body clothing?: None Help from another person to put on and taking off regular lower body clothing?: A Little 6 Click Score: 22   End of Session Equipment Utilized During Treatment: Gait belt Physiological scientist ) Nurse Communication: Mobility status  Activity Tolerance: Patient tolerated treatment well Patient left: in chair;with call bell/phone within  reach  OT Visit Diagnosis: Other abnormalities of gait and mobility (R26.89)                Time: 1308-6578 OT Time Calculation (min): 19 min Charges:  OT General Charges $OT Visit: 1 Visit OT Evaluation $OT Eval Low Complexity: 1 Low G-Codes: OT G-codes **NOT FOR INPATIENT CLASS** Functional Assessment Tool Used: Clinical judgement Functional Limitation: Self care Self Care Current Status (I6962): At least 1 percent but less than 20 percent impaired, limited or restricted Self Care Goal Status (X5284): At least 1 percent but less than 20 percent impaired, limited or restricted   Boykin Peek, OTS [phone]  Pt seen under direct supervision of OT. Agree with assessment. Berthold, OT/L  [phone] 07/10/2017 07/10/2017, 10:50 AM

## 2017-07-11 ENCOUNTER — Encounter (HOSPITAL_COMMUNITY): Payer: Self-pay | Admitting: Orthopedic Surgery

## 2017-07-11 MED ORDER — ONDANSETRON HCL 4 MG PO TABS: 4.0000 mg | ORAL_TABLET | Freq: Four times a day (QID) | ORAL | 0 refills | Status: DC | PRN

## 2017-07-11 MED ORDER — TRAMADOL HCL 50 MG PO TABS: 50.0000 mg | ORAL_TABLET | Freq: Four times a day (QID) | ORAL | 0 refills | Status: DC | PRN

## 2017-07-11 MED ORDER — HYDROCODONE-ACETAMINOPHEN 5-325 MG PO TABS: 1.0000 | ORAL_TABLET | ORAL | 0 refills | Status: DC | PRN

## 2017-07-11 NOTE — Progress Notes (Signed)
   Subjective:  Patient reports pain as mild.  Block wore off alst night.  Very comfortable.  No CP/SOB.  No n/v.  No BM but passing flatus.  Objective:   VITALS:   Vitals:   07/11/17 0449 07/11/17 0727 07/11/17 0900 07/11/17 1206  BP: 118/67 111/68  109/63  Pulse: 81 74  81  Resp: 17 16  16   Temp: (!) 97.5 F (36.4 C) 98.8 F (37.1 C)  99.2 F (37.3 C)  TempSrc: Oral     SpO2: 96% 99%  98%  Weight:   155 lb (70.3 kg)   Height:   5\' 6"  (1.676 m)    LLE- Splint in place and c/d/i, no drainage Toes wwp NVI distally  Lab Results  Component Value Date   WBC 12.8 (H) 07/10/2017   HGB 13.8 07/10/2017   HCT 41.8 07/10/2017   MCV 88.2 07/10/2017   PLT 297 07/10/2017   BMET    Component Value Date/Time   CREATININE 0.84 07/09/2017 1527   GFRNONAA >60 07/09/2017 1527   GFRAA >60 07/09/2017 1527     Assessment/Plan: 2 Days Post-Op   Active Problems:   Left trimalleolar fracture, closed, initial encounter   Up with therapy NWB LLE lovenox and SCD for dvt ppx in house, will dc home on bid asa Nerve exam normal Pain control Dc home today   Nicholes Stairs 07/11/2017, 12:31 PM   Geralynn Rile, MD [phone]

## 2017-07-11 NOTE — Discharge Instructions (Signed)
Ortho Instructions:  No weight to the left leg Elevate the leg at all times Take an 81 mg asa twice daily for 6 weeks Do not get the splint wet

## 2017-07-11 NOTE — Therapy (Signed)
Occupational Therapy Treatment Patient Details Name: Lisa Spence MRN: [id number] DOB: 07-Apr-1951 Today's Date: 07/11/2017    History of present illness Pt is a 66 y.o. female s/p left ankle ORIF due to a left trimalleolar fracture/dislocation from a fall. PMH: Anxiety, GERD.    OT comments  Focus of today's session on increased independence with functional mobility and dressing. Pt educated on compensatory technique to transfer in/out of the shower with crutches and NWB. Pt able to complete UB dressing with supervision/set up and min assist for balance during sit to stand for LB dressing. Pt safe to d/c home with assistance from friends as needed. Pt has completed all acute OT education. OT signing off.     Follow Up Recommendations  No OT follow up;Supervision - Intermittent    Equipment Recommendations  None recommended by OT    Recommendations for Other Services      Precautions / Restrictions Precautions Precautions: Fall Restrictions Weight Bearing Restrictions: Yes LLE Weight Bearing: Non weight bearing       Mobility Bed Mobility Overal bed mobility: Modified Independent             General bed mobility comments: No assist required. Pt transitioned to EOB without rails, and HOB flat to simulate home environment.   Transfers Overall transfer level: Needs assistance Equipment used: Crutches Transfers: Sit to/from Stand Sit to Stand: Supervision         General transfer comment: Pt able to maintain NWB during transfer. Balanced on RLE well while positioning crutches. No assist required, and no unsteadiness noted.     Balance Overall balance assessment: Needs assistance Sitting-balance support: No upper extremity supported;Feet supported Sitting balance-Leahy Scale: Good     Standing balance support: Bilateral upper extremity supported;During functional activity Standing balance-Leahy Scale: Fair                             ADL either  performed or assessed with clinical judgement   ADL Overall ADL's : Needs assistance/impaired     Grooming: Oral care;Supervision/safety;Set up;Standing           Upper Body Dressing : Supervision/safety;Set up;Sitting   Lower Body Dressing: Minimal assistance;Sit to/from stand Lower Body Dressing Details (indicate cue type and reason): Min assist during sit to stand to pull pants up for balance. Pt able to use lateral leans to don underwear in bed.  Toilet Transfer: Supervision/safety;Set up;Ambulation;Comfort height toilet;Grab bars         Tub/Shower Transfer Details (indicate cue type and reason): Educated pt on technique to transfer into the walk in shower safely. Pt reports she plans to sponge bathe initially.  Functional mobility during ADLs: Supervision/safety Physiological scientist )       Vision       Perception     Praxis      Cognition Arousal/Alertness: Awake/alert Behavior During Therapy: WFL for tasks assessed/performed Overall Cognitive Status: Within Functional Limits for tasks assessed                                          Exercises     Shoulder Instructions       General Comments Pt reports her friend is available to assist as needed upon d/c. Pt reports no concerns about d/c home.     Pertinent Vitals/ Pain  Pain Assessment: 0-10 Pain Score: 5  Pain Location: R ankle Pain Descriptors / Indicators: Aching;Sore Pain Intervention(s): Monitored during session  Home Living Family/patient expects to be discharged to:: Private residence Living Arrangements: Non-relatives/Friends                                      Prior Functioning/Environment              Frequency  Min 2X/week        Progress Toward Goals  OT Goals(current goals can now be found in the care plan section)  Progress towards OT goals: Progressing toward goals  Acute Rehab OT Goals Patient Stated Goal: To go home  OT Goal  Formulation: With patient Time For Goal Achievement: 07/24/17 Potential to Achieve Goals: Good  Plan Discharge plan remains appropriate    Co-evaluation                 AM-PAC PT "6 Clicks" Daily Activity     Outcome Measure   Help from another person eating meals?: None Help from another person taking care of personal grooming?: None Help from another person toileting, which includes using toliet, bedpan, or urinal?: None Help from another person bathing (including washing, rinsing, drying)?: A Little Help from another person to put on and taking off regular upper body clothing?: None Help from another person to put on and taking off regular lower body clothing?: A Little 6 Click Score: 22    End of Session Equipment Utilized During Treatment: Gait belt (Crutches)  OT Visit Diagnosis: Other abnormalities of gait and mobility (R26.89)   Activity Tolerance Patient tolerated treatment well   Patient Left in chair;with call bell/phone within reach   Nurse Communication Mobility status        Time: 1020-1045 OT Time Calculation (min): 25 min  Charges:    Boykin Peek, OTS [phone]    Boykin Peek 07/11/2017, 11:06 AM

## 2017-07-11 NOTE — Progress Notes (Signed)
Patient is discharged from room 3C10 at this time. Alert and in stable condition. Patient impatient of waiting for discharge summary to be reconciled by MD to enable nurse to print instructions. Patient has been calling frequently asking questions about her discharge, lunch and pain medications and each time, her questions were answered appropriately. Nurse unable to print AVS when patient's ride arrives as some meds needed to be reconciled by MD. MD called and notified and stated he was in surgery and will be done an hour later. Patient told what MD stated but upset and impatient. Prescriptions given to patient as she insisted on going home, not waiting for printed instructions. Family member promised she will be back to collect instructions later after taking patient home. Patient left unit via wheelchair with all belongings at side.

## 2017-07-11 NOTE — Progress Notes (Signed)
OT Note  - Addendum    07/11/17 1400  OT Visit Information  Last OT Received On 07/11/17  OT Time Calculation  OT Start Time (ACUTE ONLY) 1020  OT Stop Time (ACUTE ONLY) 1045  OT Time Calculation (min) 25 min  OT General Charges  $OT Visit 1 Visit  OT Treatments  $Self Care/Home Management  23-37 mins  [facility], OT/L  [phone] 07/11/2017
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
## 2019-02-18 IMAGING — CT CT ANKLE*L* W/O CM
3 series · 16 of 33 positions shown, 19 images · non-contrast
Comparison: Radiographs earlier this day.

CLINICAL DATA: Ankle fracture.

EXAM:
CT OF THE LEFT ANKLE WITHOUT CONTRAST
TECHNIQUE: Multidetector CT imaging of the left ankle was performed according
to the standard protocol. Multiplanar CT image reconstructions were
also generated.

[Series 4: lower ext 1.5 st · axial · 0.35mm/px · z∈[+84,+243]mm · 8 of 126 slices shown, 10 images]
[im 10/126  soft-tissue]
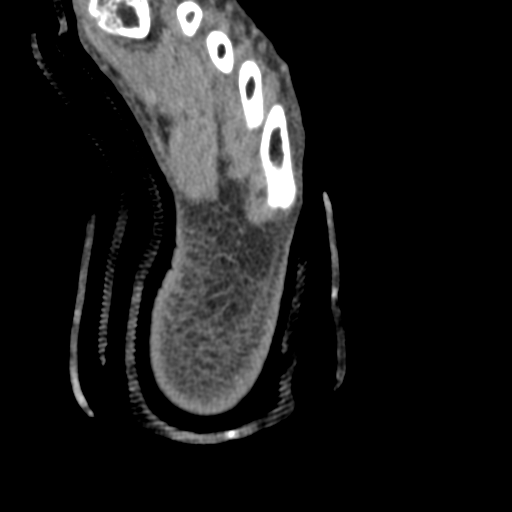
[im 10/126  bone]
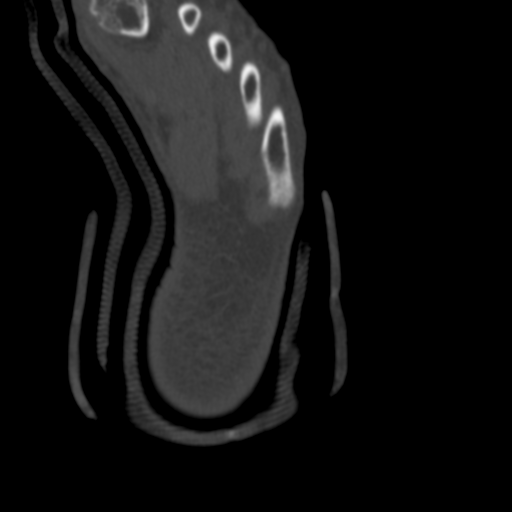
[im 29/126  bone]
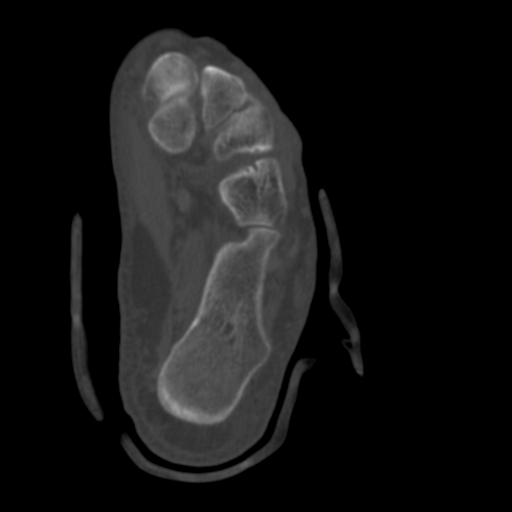
[im 39/126  bone]
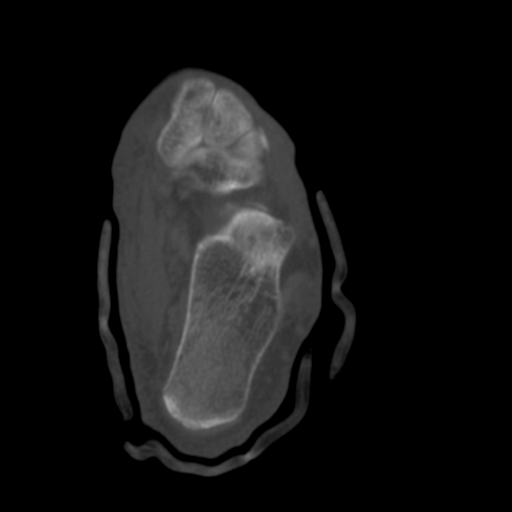
[im 58/126  bone]
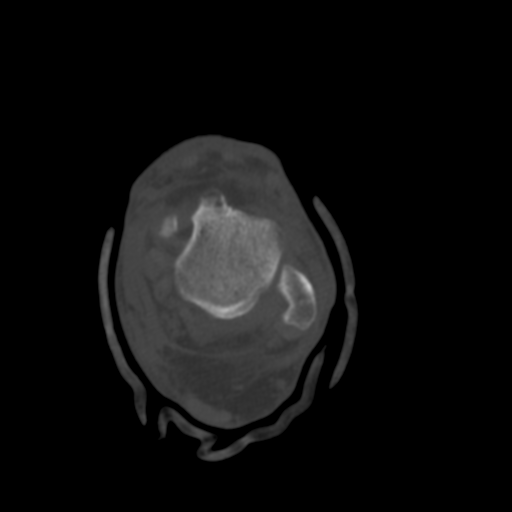
[im 68/126  soft-tissue]
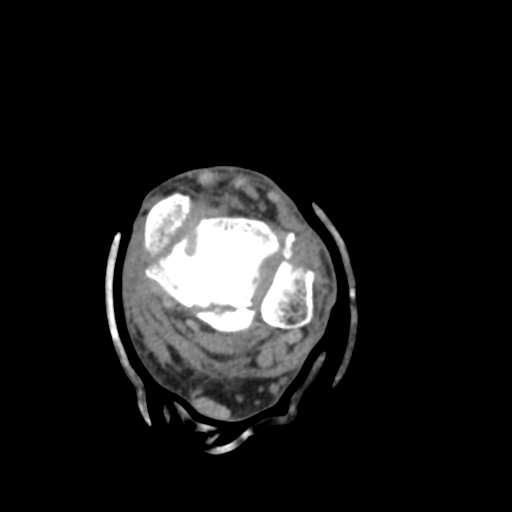
[im 68/126  bone]
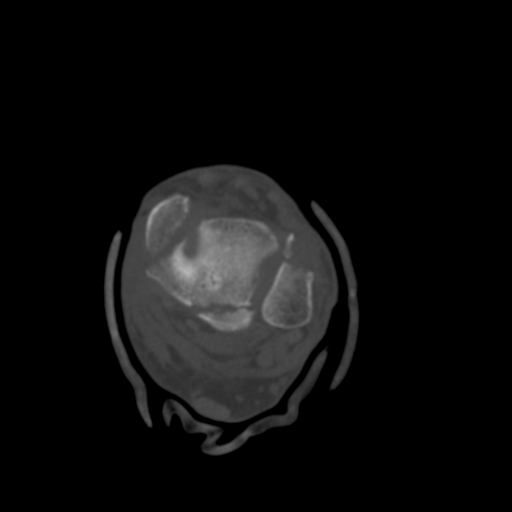
[im 87/126  bone]
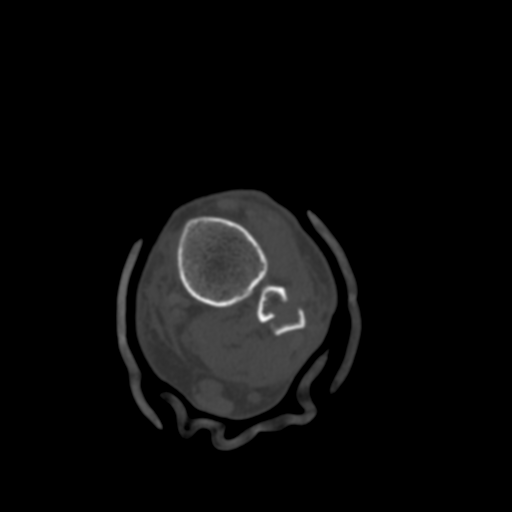
[im 97/126  bone]
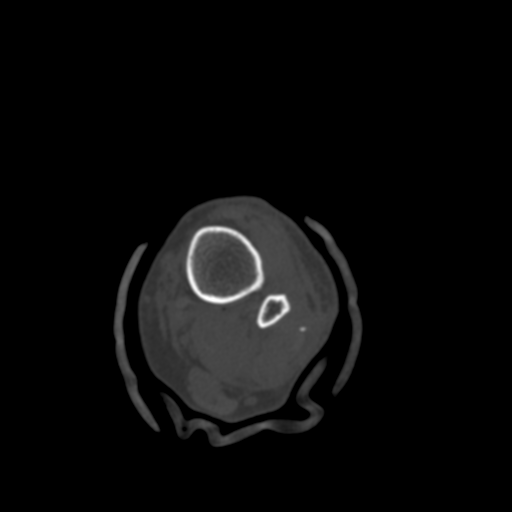
[im 116/126  bone]
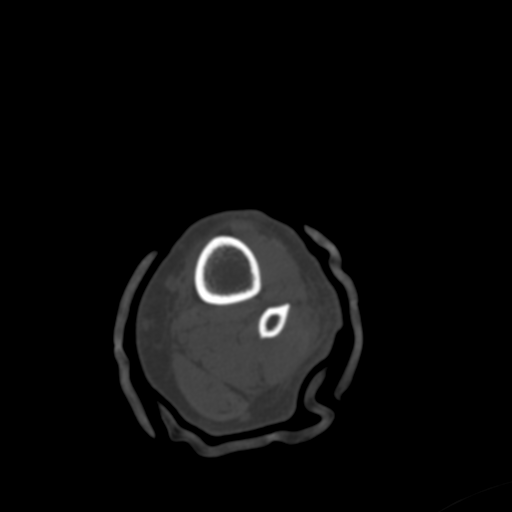

[Series 9: lower ext cor st · coronal · 0.28mm/px · 3 of 101 slices shown]
[im 21/101  bone]
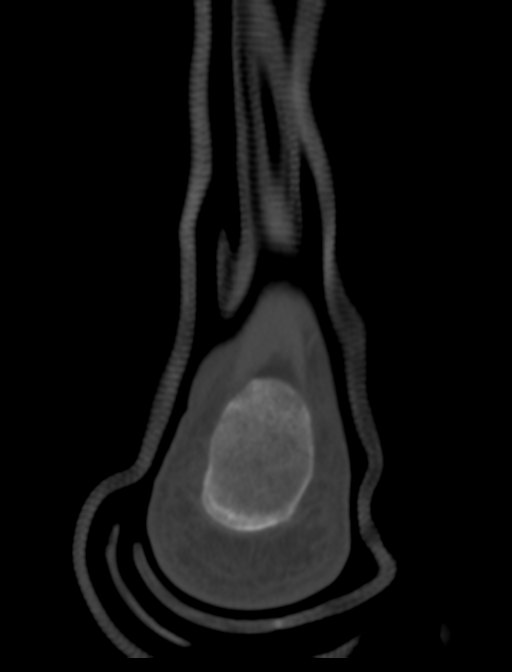
[im 41/101  bone]
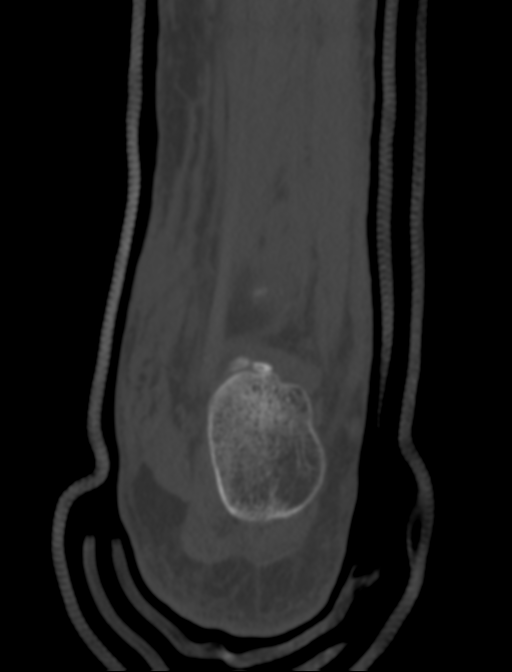
[im 61/101  bone]
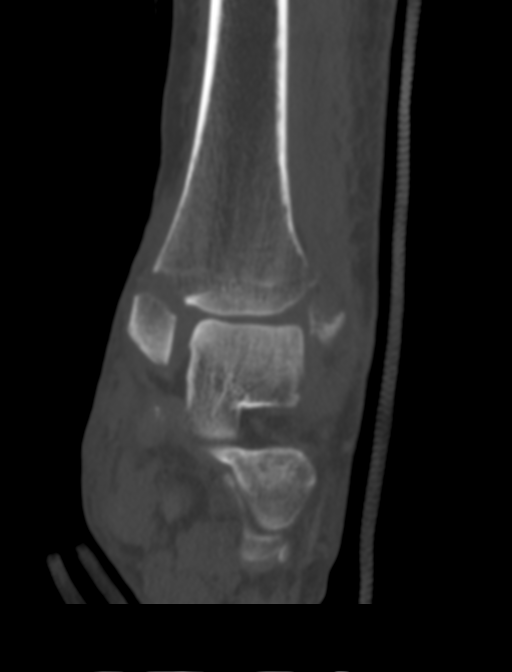

[Series 10: lower ext sag st · sagittal · 0.36mm/px · 5 of 80 slices shown, 6 images]
[im 27/80  bone]
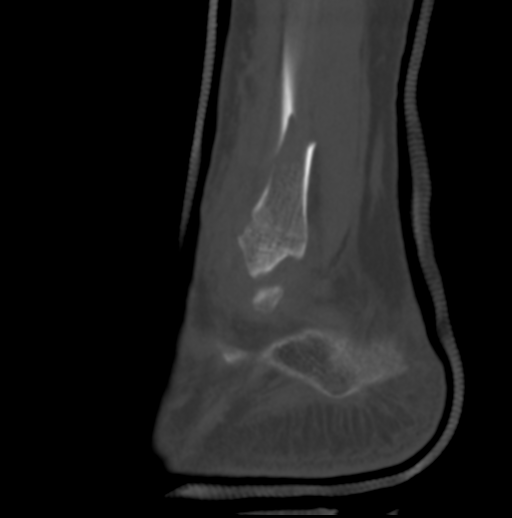
[im 33/80  bone]
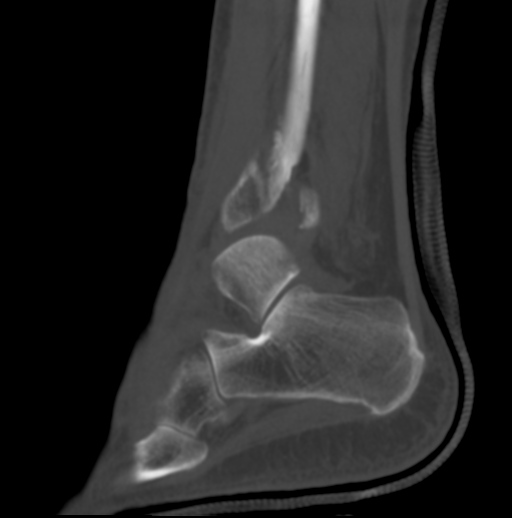
[im 40/80  soft-tissue]
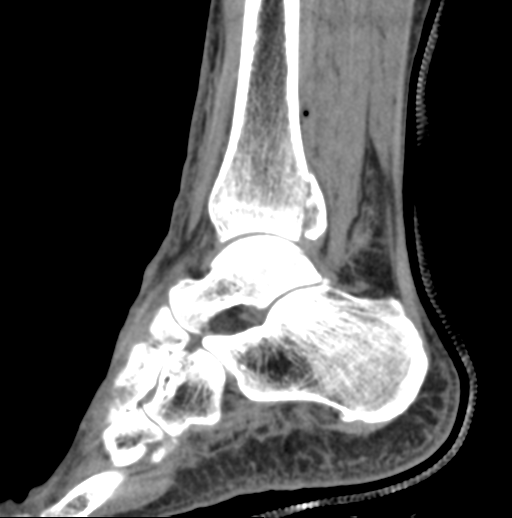
[im 40/80  bone]
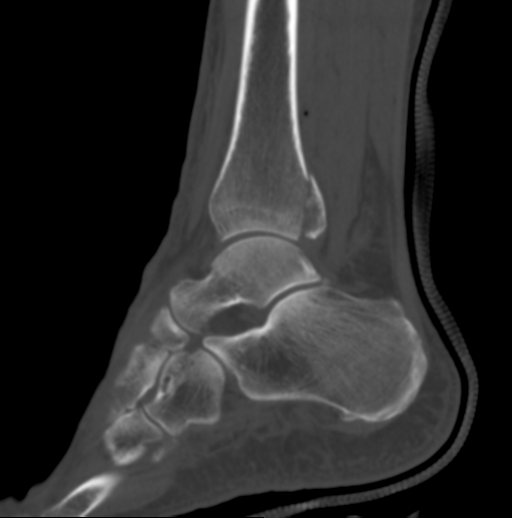
[im 47/80  bone]
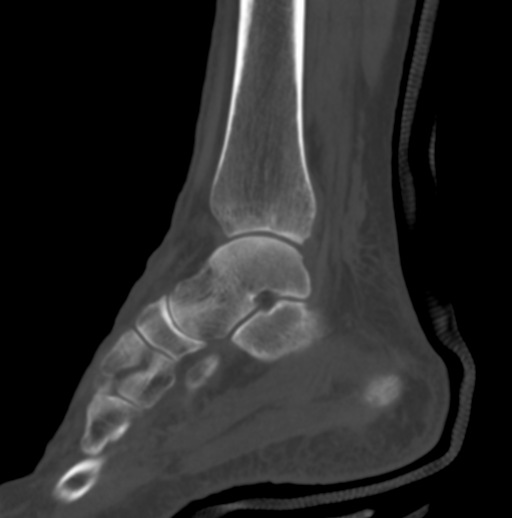
[im 53/80  bone]
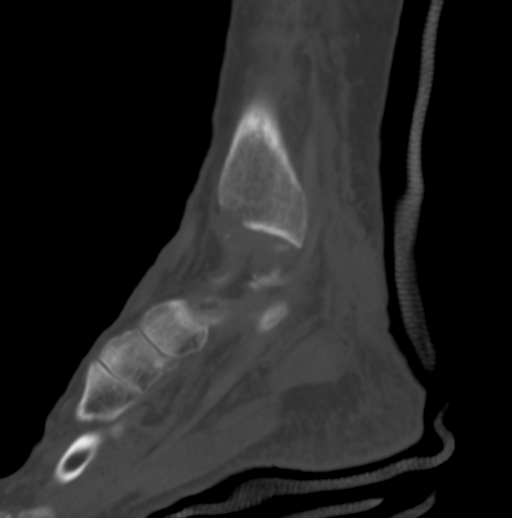

[16 of 33 positions shown; findings below may reference images not displayed]

FINDINGS: Bones/Joint/Cartilage

Oblique distal fibular fracture is displaced with 6 mm osseous
distraction, just proximal to the ankle mortise. Fracture is mildly
comminuted anteriorly. Oblique medial malleolar fracture, mildly
comminuted and displaced. Posterior malleolar fracture with 3 mm
step-off posteriorly, also minimally displaced. Mild residual
widening of the anteromedial ankle mortise. The remainder the
hindfoot is intact.

Ligaments

Suboptimally assessed by CT.

Muscles and Tendons

Intact Achilles tendon. Minimal intramuscular air is secondary to
fracture.

Soft tissues

Diffuse soft tissue edema at the fracture site.
IMPRESSION: Mildly displaced and comminuted trimalleolar fracture with mild
residual anterior medial ankle mortise widening.

## 2019-04-25 ENCOUNTER — Ambulatory Visit (INDEPENDENT_AMBULATORY_CARE_PROVIDER_SITE_OTHER): Payer: Medicare HMO | Admitting: Family Medicine

## 2019-04-25 ENCOUNTER — Telehealth: Payer: Self-pay | Admitting: *Deleted

## 2019-04-25 ENCOUNTER — Encounter: Payer: Self-pay | Admitting: Family Medicine

## 2019-04-25 ENCOUNTER — Other Ambulatory Visit: Payer: Self-pay | Admitting: Family Medicine

## 2019-04-25 ENCOUNTER — Other Ambulatory Visit: Payer: Self-pay

## 2019-04-25 VITALS — BP 124/84 | HR 63 | Ht 66.0 in | Wt 165.6 lb

## 2019-04-25 DIAGNOSIS — Z1211 Encounter for screening for malignant neoplasm of colon: Secondary | ICD-10-CM

## 2019-04-25 DIAGNOSIS — Z1239 Encounter for other screening for malignant neoplasm of breast: Secondary | ICD-10-CM

## 2019-04-25 DIAGNOSIS — F419 Anxiety disorder, unspecified: Secondary | ICD-10-CM

## 2019-04-25 DIAGNOSIS — K5904 Chronic idiopathic constipation: Secondary | ICD-10-CM

## 2019-04-25 DIAGNOSIS — Z0000 Encounter for general adult medical examination without abnormal findings: Secondary | ICD-10-CM | POA: Diagnosis not present

## 2019-04-25 DIAGNOSIS — K5900 Constipation, unspecified: Secondary | ICD-10-CM | POA: Insufficient documentation

## 2019-04-25 DIAGNOSIS — Z136 Encounter for screening for cardiovascular disorders: Secondary | ICD-10-CM

## 2019-04-25 DIAGNOSIS — K219 Gastro-esophageal reflux disease without esophagitis: Secondary | ICD-10-CM

## 2019-04-25 DIAGNOSIS — N951 Menopausal and female climacteric states: Secondary | ICD-10-CM

## 2019-04-25 MED ORDER — ESTROGENS, CONJUGATED 0.625 MG/GM VA CREA: 1.0000 | TOPICAL_CREAM | Freq: Every day | VAGINAL | 12 refills | Status: DC

## 2019-04-25 MED ORDER — SERTRALINE HCL 50 MG PO TABS: 50.0000 mg | ORAL_TABLET | Freq: Every day | ORAL | 3 refills | Status: DC

## 2019-04-25 NOTE — Assessment & Plan Note (Signed)
CBC, BMP, TSH, lipid panel, hepatitis C antibody were ordered today.  Mammogram and colonoscopy were also ordered today.

## 2019-04-25 NOTE — Assessment & Plan Note (Signed)
Recommended adding MiraLAX and titrating up until she has a soft bowel movement daily.  We will also order a colonoscopy today to screen for colon cancer since she last had one 13 years ago.

## 2019-04-25 NOTE — Assessment & Plan Note (Signed)
Prescribed topical Premarin today since this has worked well for her in the past.  Also recommended that she use over-the-counter lubricants for comfort if needed.

## 2019-04-25 NOTE — Assessment & Plan Note (Signed)
Moderately well controlled.  Side effects of prolonged pantoprazole use were detailed with the patient.  We will taper down on this and add famotidine once or twice daily.

## 2019-04-25 NOTE — Patient Instructions (Addendum)
It was nice meeting you today Ms. Foody!  I am sending a referral for a colonoscopy for you today.    I have also sent in a referral for your mammogram.  Please call the [facility] breast center to arrange your appointment for your mammogram.  Try MiraLAX for her constipation.  You can take as much as needed to have a soft bowel movement.  Please also decrease your pantoprazole to every other day for the next few weeks and then take it a couple times per week until you run out of that medication.  Please also take your tramadol only as needed until you are finished with the medication.  If you do not find that you need this medication, you can stop it whenever you like.  We are starting Zoloft 50 mg once daily for your anxiety today.  This will take up to 4 to 6 weeks to have its maximum effect, so please keep taking it even if you do not feel a difference.  Please also continue taking your clonazepam once daily for the next 4 weeks, then go down to once every other day for a week until you run out of this medication.  I have sent Premarin to your pharmacy today.  Please let me know if this medication is helpful.  We are getting full labs for you today.  We will let you know if any of these labs are abnormal, and you will receive a letter if they are normal.  If you have any questions or concerns, please feel free to call the clinic.   Be well,  Dr. Shan Levans

## 2019-04-25 NOTE — Progress Notes (Signed)
Subjective:    Lisa Spence - 68 y.o. female MRN [id number]  Date of birth: 1951/05/31  CC:  Lisa Spence is here to establish care.  She would also like to discuss intermittent constipation, vaginal dryness, acid reflux  HPI:  Vaginal dryness Is not currently sexually active, but she says that her vaginal dryness continues to bother her.  She used Premarin cream in the past with good results.  She denies abnormal vaginal discharge or pelvic pain.  Intermittent constipation She will sometimes feel constipated, but major issue for her.  She will take Dulcolax for relief, which is sometimes effective.  She is interested in any other recommendations for this issue.  She denies rectal bleeding or melena.  She is due for a colonoscopy, since her last one was in 2007.  Acid reflux Has been on Pepcid and pantoprazole in the past but is currently only on pantoprazole.  Her acid reflux is moderately well controlled.  Anxiety Patient has been on clonazepam 0.5 mg 3 times daily as needed for the last 10 years and this does seem to help relieve her anxiety, but she is worried about the side effects of this medication since it is a controlled substance.  She would like to see if there is something that she could take to prevent anxiety rather than treat it as needed.  She denies SI or feelings of hopelessness and anhedonia.  PMH: fibroids s/p hysterectomy, GERD, fungal infections on intertriginous areas, anxiety  Medications: tramadol PRN, pantoprazole, clonazepam 0.5 mg TID PRN, nystatin cream  FH: heart disease in both parents  Surgical History: hysterectomy (still has ovaries but no cervix), left ankle repair  Social History: walks, plays softball, no alcohol or cigarette use, no current sexual partners and lives by herself  Health Maintenance:  -Would like referral for mammogram and colonoscopy today.  Will obtain hepatitis C screening today. Health Maintenance Due  Topic Date Due   . Hepatitis C Screening  09-07-1951  . TETANUS/TDAP  12/13/1971  . MAMMOGRAM  12/13/2002  . COLONOSCOPY  12/13/2002  . PNA vac Low Risk Adult (1 of 2 - PCV13) 12/12/2017    -  reports that she has never smoked. She has never used smokeless tobacco. Review of Systems  Constitutional: Negative for fever and weight loss.  Respiratory: Negative for cough.   Cardiovascular: Negative for chest pain and leg swelling.  Gastrointestinal: Positive for constipation and heartburn.  Genitourinary: Negative for dysuria.  Musculoskeletal: Negative for myalgias.  Skin: Negative for rash.  Neurological: Negative for dizziness.  Psychiatric/Behavioral: The patient is nervous/anxious.     - Past Medical History: Patient Active Problem List   Diagnosis Date Noted  . Anxiety 04/25/2019  . GERD (gastroesophageal reflux disease) 04/25/2019  . Constipation 04/25/2019  . Vaginal dryness, menopausal 04/25/2019  . Healthcare maintenance 04/25/2019  . Left trimalleolar fracture, closed, initial encounter 07/09/2017   - Medications: reviewed and updated   Objective:   Physical Exam BP 124/84   Pulse 63   Ht 5\' 6"  (1.676 m)   Wt 165 lb 9.6 oz (75.1 kg)   SpO2 97%   BMI 26.73 kg/m  Gen: NAD, alert, cooperative with exam, well-appearing HEENT: NCAT, PERRL, clear conjunctiva, oropharynx clear, supple neck, TMs clear bilaterally CV: RRR, good S1/S2, no murmur, no edema, capillary refill brisk  Resp: CTABL, no wheezes, non-labored Abd: SNTND, BS present, no guarding or organomegaly Skin: no rashes, normal turgor  Neuro: no gross deficits.  Psych: good  insight, alert and oriented        Assessment & Plan:   Anxiety Will start Zoloft 50 mg daily today.  Patient was counseled that it may take up to 4 to 6 weeks for this medication to reach its full effect.  She was also counseled that we can increase the dose in time if needed.  Would like for her to continue her clonazepam once daily as needed  for the next month and then start to taper down, which would mean that she take it every other day and then about twice per week until she runs out of medication.  We will need to reassess her anxiety in about 1 to 2 months.  GERD (gastroesophageal reflux disease) Moderately well controlled.  Side effects of prolonged pantoprazole use were detailed with the patient.  We will taper down on this and add famotidine once or twice daily.  Constipation Recommended adding MiraLAX and titrating up until she has a soft bowel movement daily.  We will also order a colonoscopy today to screen for colon cancer since she last had one 13 years ago.  Vaginal dryness, menopausal Prescribed topical Premarin today since this has worked well for her in the past.  Also recommended that she use over-the-counter lubricants for comfort if needed.  Healthcare maintenance CBC, BMP, TSH, lipid panel, hepatitis C antibody were ordered today.  Mammogram and colonoscopy were also ordered today.    Maia Breslow, M.D. 04/25/2019, 2:08 PM PGY-3, [facility]

## 2019-04-25 NOTE — Telephone Encounter (Signed)
Premarin will cost $350 she would like something else called in or another suggestion. Christen Bame, CMA

## 2019-04-25 NOTE — Assessment & Plan Note (Signed)
Will start Zoloft 50 mg daily today.  Patient was counseled that it may take up to 4 to 6 weeks for this medication to reach its full effect.  She was also counseled that we can increase the dose in time if needed.  Would like for her to continue her clonazepam once daily as needed for the next month and then start to taper down, which would mean that she take it every other day and then about twice per week until she runs out of medication.  We will need to reassess her anxiety in about 1 to 2 months.

## 2019-04-26 LAB — BASIC METABOLIC PANEL
BUN/Creatinine Ratio: 17 (ref 12–28)
BUN: 12 mg/dL (ref 8–27)
CO2: 23 mmol/L (ref 20–29)
Calcium: 9.5 mg/dL (ref 8.7–10.3)
Chloride: 102 mmol/L (ref 96–106)
Creatinine, Ser: 0.7 mg/dL (ref 0.57–1.00)
GFR calc Af Amer: 104 mL/min/{1.73_m2} (ref >59)
GFR calc non Af Amer: 91 mL/min/{1.73_m2} (ref >59)
Glucose: 89 mg/dL (ref 65–99)
Potassium: 4.5 mmol/L (ref 3.5–5.2)
Sodium: 142 mmol/L (ref 134–144)

## 2019-04-26 LAB — CBC
Hematocrit: 45.1 % (ref 34.0–46.6)
Hemoglobin: 14.8 g/dL (ref 11.1–15.9)
MCH: 29.7 pg (ref 26.6–33.0)
MCHC: 32.8 g/dL (ref 31.5–35.7)
MCV: 90 fL (ref 79–97)
Platelets: 239 10*3/uL (ref 150–450)
RBC: 4.99 x10E6/uL (ref 3.77–5.28)
RDW: 12.6 % (ref 11.7–15.4)
WBC: 7.9 10*3/uL (ref 3.4–10.8)

## 2019-04-26 LAB — LIPID PANEL
Chol/HDL Ratio: 4.2 ratio (ref 0.0–4.4)
Cholesterol, Total: 190 mg/dL (ref 100–199)
HDL: 45 mg/dL (ref >39)
LDL Calculated: 116 mg/dL — ABNORMAL HIGH (ref 0–99)
Triglycerides: 145 mg/dL (ref 0–149)
VLDL Cholesterol Cal: 29 mg/dL (ref 5–40)

## 2019-04-26 LAB — HEPATITIS C ANTIBODY: Hep C Virus Ab: <0.1 s/co ratio (ref 0.0–0.9)

## 2019-04-26 LAB — TSH: TSH: 2.19 u[IU]/mL (ref 0.450–4.500)

## 2019-04-28 ENCOUNTER — Encounter: Payer: Self-pay | Admitting: Gastroenterology

## 2019-04-29 ENCOUNTER — Other Ambulatory Visit: Payer: Self-pay | Admitting: Family Medicine

## 2019-04-29 MED ORDER — ESTRADIOL 10 MCG VA TABS: ORAL_TABLET | VAGINAL | 2 refills | Status: DC

## 2019-04-29 NOTE — Telephone Encounter (Signed)
Patient LVM on nurse line stating she missed pcp call. Patient stated she spoke with her insurance and they will cover estrace. I attempted to call patient back to inform of estradiol to her pharmacy. However, she did not answer. Patient stated she would be available tomorrow until 2, if you want to her then.

## 2019-04-29 NOTE — Telephone Encounter (Signed)
Patient calls back. Pt stated the estradiol is also ~200 dollars. Estrace will only cost her 50 dollars, If PCP feels this is an option please send to [facility] in [place].

## 2019-04-29 NOTE — Telephone Encounter (Signed)
Left a voicemail with Ms. Giannotti saying that I have looked into the cost of the Premarin, and it does look like it is very expensive with very few options to make it more affordable.  There is an alternative called Vagifem which is vaginal estrogen.  I will go ahead and send this to her pharmacy as it looks to be much more affordable than Premarin and should do basically the same thing.  I also spoke with her regarding her lab results.  They all looked normal except for her mildly elevated LDL.  Her ASCVD risk score is about 6%, and it would be reduced to 4% with the addition of statin therapy.  I will hold off on prescribing this unless she calls them and see me that she is interested in this.  Amanda C. Shan Levans, MD PGY-3, [facility] 04/29/2019 12:31 PM

## 2019-05-01 ENCOUNTER — Other Ambulatory Visit: Payer: Self-pay

## 2019-05-01 ENCOUNTER — Other Ambulatory Visit: Payer: Self-pay | Admitting: Family Medicine

## 2019-05-01 ENCOUNTER — Telehealth: Payer: Self-pay | Admitting: *Deleted

## 2019-05-01 ENCOUNTER — Telehealth (INDEPENDENT_AMBULATORY_CARE_PROVIDER_SITE_OTHER): Payer: Medicare HMO | Admitting: Family Medicine

## 2019-05-01 DIAGNOSIS — F419 Anxiety disorder, unspecified: Secondary | ICD-10-CM | POA: Diagnosis not present

## 2019-05-01 DIAGNOSIS — N951 Menopausal and female climacteric states: Secondary | ICD-10-CM

## 2019-05-01 MED ORDER — CITALOPRAM HYDROBROMIDE 20 MG PO TABS: 20.0000 mg | ORAL_TABLET | Freq: Every day | ORAL | 3 refills | Status: DC

## 2019-05-01 MED ORDER — ESTROGENS, CONJUGATED 0.625 MG/GM VA CREA: 1.0000 | TOPICAL_CREAM | VAGINAL | 12 refills | Status: DC

## 2019-05-01 MED ORDER — ESTROGENS, CONJUGATED 0.625 MG/GM VA CREA: 1.0000 | TOPICAL_CREAM | Freq: Every day | VAGINAL | 12 refills | Status: DC

## 2019-05-01 NOTE — Telephone Encounter (Signed)
Pt lm on nurse line that she wants to stop taking the zoloft as it upsets her stomach.  She would also like to discuss some other meds.   Called back and LMOVM that she could have a telephone visit this afternoon with Dr. Shan Levans if she like.   Asked to her call back Christen Bame, CMA

## 2019-05-01 NOTE — Progress Notes (Signed)
[facility] Visit  Patient consented to have virtual visit. Method of visit: Telephone  Encounter participants: Patient: Lisa Spence - located at home Provider: Kathrene Alu - located at [facility] Others (if applicable): none  Chief Complaint: stomach pain and vasomotor menopausal symptoms with Zoloft, cost of Premarin and estradiol creams  HPI: Stomach pain and vasomotor symptoms of menopause Patient says that since beginning Zoloft, she has had more stomach pain and her vasomotor symptoms, including frequent hot flashes, have worsened.  She is unsure if Zoloft is the reason for this, but she cannot think of any other medication that could be causing the symptoms.  She will have hot flashes multiple times per day when prior to starting Zoloft, they only occurred a few times per week at most.  Cost of Premarin and estradiol vaginal cream Patient reports that Premarin costs $350 for 3 months rather than 1 month, which she thinks could be affordable for her.  The estradiol vaginal cream is about $200 for a 18-month supply.  She says that she thinks Premarin may be what she would like to try since it has worked well for her in the past.  ROS: per HPI  Pertinent PMHx: Vaginal dryness due to menopause, anxiety, GERD  Exam:  Respiratory: Speaks in full sentences without shortness of breath, no cough  Assessment/Plan:  Vaginal dryness, menopausal Since patient is amenable to trying Premarin despite the cost, will prescribe this medication to be dosed twice weekly vaginally.  Hot flashes due to menopause More literature supports certain SSRIs such as Celexa rather than Zoloft for treatment of hot flashes due to menopause, so we will switch her Zoloft 50 mg to Celexa 20 mg daily.  Anxiety Hopefully her anxiety will respond well to Celexa 20 mg.  Patient was advised to continue taking clonazepam as she has been, which is about every other day in order to  wean slowly off of this medication while the Celexa is building in her system.  She was counseled that Celexa and other SSRIs may take up to 4 weeks to reach their full effect.  We hope to not refill her clonazepam after she finishes the 20 pills that she has remaining.    Time spent during visit with patient: 13 minutes

## 2019-05-02 DIAGNOSIS — N951 Menopausal and female climacteric states: Secondary | ICD-10-CM | POA: Insufficient documentation

## 2019-05-02 NOTE — Assessment & Plan Note (Signed)
Since patient is amenable to trying Premarin despite the cost, will prescribe this medication to be dosed twice weekly vaginally.

## 2019-05-02 NOTE — Assessment & Plan Note (Signed)
Hopefully her anxiety will respond well to Celexa 20 mg.  Patient was advised to continue taking clonazepam as she has been, which is about every other day in order to wean slowly off of this medication while the Celexa is building in her system.  She was counseled that Celexa and other SSRIs may take up to 4 weeks to reach their full effect.  We hope to not refill her clonazepam after she finishes the 20 pills that she has remaining.

## 2019-05-02 NOTE — Assessment & Plan Note (Signed)
More literature supports certain SSRIs such as Celexa rather than Zoloft for treatment of hot flashes due to menopause, so we will switch her Zoloft 50 mg to Celexa 20 mg daily.

## 2019-06-06 ENCOUNTER — Other Ambulatory Visit: Payer: Self-pay

## 2019-06-06 ENCOUNTER — Encounter: Payer: Self-pay | Admitting: Gastroenterology

## 2019-06-06 ENCOUNTER — Ambulatory Visit (AMBULATORY_SURGERY_CENTER): Payer: Self-pay

## 2019-06-06 VITALS — Ht 66.0 in | Wt 163.0 lb

## 2019-06-06 DIAGNOSIS — Z1211 Encounter for screening for malignant neoplasm of colon: Secondary | ICD-10-CM

## 2019-06-06 MED ORDER — NA SULFATE-K SULFATE-MG SULF 17.5-3.13-1.6 GM/177ML PO SOLN: 1.0000 | Freq: Once | ORAL | 0 refills | Status: AC

## 2019-06-06 NOTE — Progress Notes (Signed)
Denies allergies to eggs or soy products. Denies complication of anesthesia or sedation. Denies use of weight loss medication. Denies use of O2.   Emmi instructions given for colonoscopy.  

## 2019-06-12 ENCOUNTER — Ambulatory Visit: Payer: Medicare HMO | Admitting: Family Medicine

## 2019-06-13 ENCOUNTER — Ambulatory Visit
Admission: RE | Admit: 2019-06-13 | Discharge: 2019-06-13 | Disposition: A | Discharge status: Home / Self Care | Payer: Medicare HMO | Source: Ambulatory Visit | Attending: Family Medicine | Admitting: Family Medicine

## 2019-06-13 ENCOUNTER — Other Ambulatory Visit: Payer: Self-pay | Admitting: Family Medicine

## 2019-06-13 ENCOUNTER — Other Ambulatory Visit: Payer: Self-pay

## 2019-06-13 DIAGNOSIS — Z1239 Encounter for other screening for malignant neoplasm of breast: Secondary | ICD-10-CM

## 2019-06-16 ENCOUNTER — Telehealth: Payer: Self-pay

## 2019-06-16 NOTE — Telephone Encounter (Signed)
Covid-19 screening questions   Do you now or have you had a fever in the last 14 days? NO   Do you have any respiratory symptoms of shortness of breath or cough now or in the last 14 days? NO  Do you have any family members or close contacts with diagnosed or suspected Covid-19 in the past 14 days? NO  Have you been tested for Covid-19 and found to be positive? NO        

## 2019-06-17 ENCOUNTER — Ambulatory Visit (AMBULATORY_SURGERY_CENTER): Payer: Medicare HMO | Admitting: Gastroenterology

## 2019-06-17 ENCOUNTER — Encounter: Payer: Self-pay | Admitting: Gastroenterology

## 2019-06-17 ENCOUNTER — Other Ambulatory Visit: Payer: Self-pay | Admitting: Gastroenterology

## 2019-06-17 ENCOUNTER — Other Ambulatory Visit: Payer: Self-pay

## 2019-06-17 VITALS — BP 127/78 | HR 64 | Temp 98.5°F | Resp 15 | Ht 66.0 in | Wt 163.0 lb

## 2019-06-17 DIAGNOSIS — Z1211 Encounter for screening for malignant neoplasm of colon: Secondary | ICD-10-CM | POA: Diagnosis not present

## 2019-06-17 DIAGNOSIS — D123 Benign neoplasm of transverse colon: Secondary | ICD-10-CM | POA: Diagnosis not present

## 2019-06-17 MED ORDER — SODIUM CHLORIDE 0.9 % IV SOLN: 500.0000 mL | Freq: Once | INTRAVENOUS | Status: DC

## 2019-06-17 NOTE — Progress Notes (Signed)
Pt's states no medical or surgical changes since previsit or office visit. 

## 2019-06-17 NOTE — Progress Notes (Signed)
Called to room to assist during endoscopic procedure.  Patient ID and intended procedure confirmed with present staff. Received instructions for my participation in the procedure from the performing physician.  

## 2019-06-17 NOTE — Progress Notes (Signed)
Report given to PACU, vss 

## 2019-06-17 NOTE — Patient Instructions (Signed)
Please read handouts provided. Continue present medications. Await pathology results.        YOU HAD AN ENDOSCOPIC PROCEDURE TODAY AT THE [facility]:   Refer to the procedure report that was given to you for any specific questions about what was found during the examination.  If the procedure report does not answer your questions, please call your gastroenterologist to clarify.  If you requested that your care partner not be given the details of your procedure findings, then the procedure report has been included in a sealed envelope for you to review at your convenience later.  YOU SHOULD EXPECT: Some feelings of bloating in the abdomen. Passage of more gas than usual.  Walking can help get rid of the air that was put into your GI tract during the procedure and reduce the bloating. If you had a lower endoscopy (such as a colonoscopy or flexible sigmoidoscopy) you may notice spotting of blood in your stool or on the toilet paper. If you underwent a bowel prep for your procedure, you may not have a normal bowel movement for a few days.  Please Note:  You might notice some irritation and congestion in your nose or some drainage.  This is from the oxygen used during your procedure.  There is no need for concern and it should clear up in a day or so.  SYMPTOMS TO REPORT IMMEDIATELY:   Following lower endoscopy (colonoscopy or flexible sigmoidoscopy):  Excessive amounts of blood in the stool  Significant tenderness or worsening of abdominal pains  Swelling of the abdomen that is new, acute  Fever of 100F or higher    For urgent or emergent issues, a gastroenterologist can be reached at any hour by calling [phone].   DIET:  We do recommend a small meal at first, but then you may proceed to your regular diet.  Drink plenty of fluids but you should avoid alcoholic beverages for 24 hours.  ACTIVITY:  You should plan to take it easy for the rest of today and you should NOT  DRIVE or use heavy machinery until tomorrow (because of the sedation medicines used during the test).    FOLLOW UP: Our staff will call the number listed on your records 48-72 hours following your procedure to check on you and address any questions or concerns that you may have regarding the information given to you following your procedure. If we do not reach you, we will leave a message.  We will attempt to reach you two times.  During this call, we will ask if you have developed any symptoms of COVID 19. If you develop any symptoms (ie: fever, flu-like symptoms, shortness of breath, cough etc.) before then, please call [phone].  If you test positive for Covid 19 in the 2 weeks post procedure, please call and report this information to us.    If any biopsies were taken you will be contacted by phone or by letter within the next 1-3 weeks.  Please call us at [phone] if you have not heard about the biopsies in 3 weeks.    SIGNATURES/CONFIDENTIALITY: You and/or your care partner have signed paperwork which will be entered into your electronic medical record.  These signatures attest to the fact that that the information above on your After Visit Summary has been reviewed and is understood.  Full responsibility of the confidentiality of this discharge information lies with you and/or your care-partner. 

## 2019-06-17 NOTE — Op Note (Signed)
[facility] Patient Name: Lisa Spence Procedure Date: 06/17/2019 8:07 AM MRN: [id number] Endoscopist: Mauri Pole , MD Age: 68 Referring MD:  Date of Birth: 02-20-1951 Gender: Female Account #: [id number] Procedure:                Colonoscopy Indications:              Screening for colorectal malignant neoplasm Medicines:                Monitored Anesthesia Care Procedure:                Pre-Anesthesia Assessment:                           - Prior to the procedure, a History and Physical                            was performed, and patient medications and                            allergies were reviewed. The patient's tolerance of                            previous anesthesia was also reviewed. The risks                            and benefits of the procedure and the sedation                            options and risks were discussed with the patient.                            All questions were answered, and informed consent                            was obtained. Prior Anticoagulants: The patient has                            taken no previous anticoagulant or antiplatelet                            agents. ASA Grade Assessment: II - A patient with                            mild systemic disease. After reviewing the risks                            and benefits, the patient was deemed in                            satisfactory condition to undergo the procedure.                           After obtaining informed consent, the colonoscope  was passed under direct vision. Throughout the                            procedure, the patient's blood pressure, pulse, and                            oxygen saturations were monitored continuously. The                            Colonoscope was introduced through the anus and                            advanced to the the cecum, identified by                            appendiceal orifice  and ileocecal valve. The                            colonoscopy was performed without difficulty. The                            patient tolerated the procedure well. The quality                            of the bowel preparation was excellent. The                            ileocecal valve, appendiceal orifice, and rectum                            were photographed. Scope In: 8:20:07 AM Scope Out: 8:35:40 AM Scope Withdrawal Time: 0 hours 7 minutes 38 seconds  Total Procedure Duration: 0 hours 15 minutes 33 seconds  Findings:                 The perianal and digital rectal examinations were                            normal.                           A 10 mm polyp was found in the transverse colon.                            The polyp was sessile. The polyp was removed with a                            cold snare. Resection and retrieval were complete.                           Multiple small and large-mouthed diverticula were                            found in the sigmoid colon.  Non-bleeding internal hemorrhoids were found during                            retroflexion. The hemorrhoids were small. Complications:            No immediate complications. Estimated Blood Loss:     Estimated blood loss was minimal. Impression:               - One 10 mm polyp in the transverse colon, removed                            with a cold snare. Resected and retrieved.                           - Mild diverticulosis in the sigmoid colon.                           - Non-bleeding internal hemorrhoids. Recommendation:           - Patient has a contact number available for                            emergencies. The signs and symptoms of potential                            delayed complications were discussed with the                            patient. Return to normal activities tomorrow.                            Written discharge instructions were provided to the                             patient.                           - Resume previous diet.                           - Continue present medications.                           - Await pathology results.                           - Repeat colonoscopy in 3 - 5 years for                            surveillance based on pathology results. Mauri Pole, MD 06/17/2019 8:45:59 AM This report has been signed electronically.

## 2019-06-18 ENCOUNTER — Other Ambulatory Visit: Payer: Self-pay

## 2019-06-19 ENCOUNTER — Ambulatory Visit (INDEPENDENT_AMBULATORY_CARE_PROVIDER_SITE_OTHER): Payer: Medicare HMO

## 2019-06-19 ENCOUNTER — Encounter: Payer: Self-pay | Admitting: Family Medicine

## 2019-06-19 ENCOUNTER — Other Ambulatory Visit: Payer: Self-pay

## 2019-06-19 ENCOUNTER — Ambulatory Visit (INDEPENDENT_AMBULATORY_CARE_PROVIDER_SITE_OTHER): Payer: Medicare HMO | Admitting: Family Medicine

## 2019-06-19 ENCOUNTER — Telehealth: Payer: Self-pay | Admitting: *Deleted

## 2019-06-19 VITALS — BP 120/82 | HR 64 | Temp 98.1°F | Ht 66.0 in | Wt 160.0 lb

## 2019-06-19 DIAGNOSIS — R058 Other specified cough: Secondary | ICD-10-CM

## 2019-06-19 DIAGNOSIS — R05 Cough: Secondary | ICD-10-CM | POA: Diagnosis not present

## 2019-06-19 DIAGNOSIS — N951 Menopausal and female climacteric states: Secondary | ICD-10-CM

## 2019-06-19 DIAGNOSIS — Z7689 Persons encountering health services in other specified circumstances: Secondary | ICD-10-CM

## 2019-06-19 DIAGNOSIS — R350 Frequency of micturition: Secondary | ICD-10-CM

## 2019-06-19 DIAGNOSIS — R079 Chest pain, unspecified: Secondary | ICD-10-CM | POA: Diagnosis not present

## 2019-06-19 DIAGNOSIS — K219 Gastro-esophageal reflux disease without esophagitis: Secondary | ICD-10-CM

## 2019-06-19 LAB — URINALYSIS
Bilirubin Urine: NEGATIVE
Hgb urine dipstick: NEGATIVE
Ketones, ur: NEGATIVE
Leukocytes,Ua: NEGATIVE
Nitrite: NEGATIVE
Specific Gravity, Urine: 1.025 (ref 1.000–1.030)
Total Protein, Urine: NEGATIVE
Urine Glucose: NEGATIVE
Urobilinogen, UA: 0.2 (ref 0.0–1.0)
pH: 5.5 (ref 5.0–8.0)

## 2019-06-19 MED ORDER — CITALOPRAM HYDROBROMIDE 20 MG PO TABS: 20.0000 mg | ORAL_TABLET | Freq: Every day | ORAL | 3 refills | Status: DC

## 2019-06-19 NOTE — Telephone Encounter (Signed)
  Follow up Call-  Call back number 06/17/2019  Post procedure Call Back phone  # [phone]  Permission to leave phone message Yes  Some recent data might be hidden     Patient questions:  Do you have a fever, pain , or abdominal swelling? No. Pain Score  0 *  Have you tolerated food without any problems? Yes.    Have you been able to return to your normal activities? Yes.    Do you have any questions about your discharge instructions: Diet   No. Medications  No. Follow up visit  No.  Do you have questions or concerns about your Care? No.  Actions: * If pain score is 4 or above: No action needed, pain <4.  1. Have you developed a fever since your procedure? no  2.   Have you had an respiratory symptoms (SOB or cough) since your procedure? no  3.   Have you tested positive for COVID 19 since your procedure no  4.   Have you had any family members/close contacts diagnosed with the COVID 19 since your procedure?  no   If yes to any of these questions please route to Joylene John, RN and Alphonsa Gin, Therapist, sports.

## 2019-06-19 NOTE — Progress Notes (Signed)
Lisa Spence is a 68 y.o. female  Chief Complaint  Patient presents with  . Establish Care    est care/ CPE- recent labs 03/2019    HPI: Lisa Spence is a 68 y.o. female here as a new patient. She had CPE and labs in 03/2019. She would like to get a flu shot but plans to come for it in 06/2019.  She has multiple concerns: 1. She would like to have her "urine checked". She is not having symptoms. She just think it "seems to be a good idea" 2. Pt has been Rx'd premarin cream for vaginal dryness but has not started to use it. She would like to know if I think it is safe to use. 3. She was also Rx'd celexa 20mg  daily but also did not start that. She notes much less anxiety that previously. She would like to come off of clonazepam. She has been on clonazepam 0.5mg  BID PRN x many years - she states she averages 2 tabs per day over the course of a year, but some days does not take any. She tried zoloft but "didn't like it". 4. Pain beneath Rt breast over ribs x months. No known injury or trauma. No SOB, DOE. Pain is with certain positions, sometimes with palpation. No treatment.  5. She is on protonix for GERD but is tapering off of it and taking 1 tab every 3 days. She also has pepid PRN but does not take. No symptoms currently. 6. Pt complains of throat clearing cough, PND, runny/stuffy nose. She has been using saline spray and infrequently takes generic claritin. Symptoms x months. No fever, chills, myalgias, GI symptoms.    Last PAP: s/p partial hysterectomy 24 years ago, last PAP 5 years ago Last mammo: UTD Last Dexa: 2019 in [place] Last colonoscopy: UTD   Past Medical History:  Diagnosis Date  . Allergy   . Anxiety   . GERD (gastroesophageal reflux disease)     Past Surgical History:  Procedure Laterality Date  . ABDOMINAL HYSTERECTOMY    . COLONOSCOPY    . ORIF ANKLE FRACTURE Left 07/09/2017   Procedure: OPEN REDUCTION INTERNAL FIXATION (ORIF) ANKLE FRACTURE;  Surgeon:  Nicholes Stairs, MD;  Location: [facility];  Service: Orthopedics;  Laterality: Left;  90 mins    Social History   Socioeconomic History  . Marital status: Single    Spouse name: Not on file  . Number of children: Not on file  . Years of education: Not on file  . Highest education level: Not on file  Occupational History  . Not on file  Social Needs  . Financial resource strain: Not on file  . Food insecurity    Worry: Not on file    Inability: Not on file  . Transportation needs    Medical: Not on file    Non-medical: Not on file  Tobacco Use  . Smoking status: Never Smoker  . Smokeless tobacco: Never Used  Substance and Sexual Activity  . Alcohol use: No    Comment: no longer drinks, drank 6 glasses wine/week stopped in 2007  . Drug use: No  . Sexual activity: Not Currently  Lifestyle  . Physical activity    Days per week: Not on file    Minutes per session: Not on file  . Stress: Not on file  Relationships  . Social Herbalist on phone: Not on file    Gets together: Not on file  Attends religious service: Not on file    Active member of club or organization: Not on file    Attends meetings of clubs or organizations: Not on file    Relationship status: Not on file  . Intimate partner violence    Fear of current or ex partner: Not on file    Emotionally abused: Not on file    Physically abused: Not on file    Forced sexual activity: Not on file  Other Topics Concern  . Not on file  Social History Narrative  . Not on file    Family History  Problem Relation Age of Onset  . Heart disease Mother   . Heart disease Father   . Colon cancer Neg Hx   . Esophageal cancer Neg Hx   . Rectal cancer Neg Hx   . Stomach cancer Neg Hx       There is no immunization history on file for this patient.  Outpatient Encounter Medications as of 06/19/2019  Medication Sig  . clonazePAM (KLONOPIN) 0.5 MG tablet Take 0.5 mg by mouth 2 (two) times daily as needed  for anxiety. 1-2 tablets twice daily as needed  . famotidine (PEPCID) 10 MG tablet Take 10 mg by mouth as needed for heartburn or indigestion.  . pantoprazole (PROTONIX) 20 MG tablet Take 20 mg by mouth daily as needed for heartburn.   . traMADol (ULTRAM) 50 MG tablet Take 1-2 tablets (50-100 mg total) by mouth every 6 (six) hours as needed for moderate pain.  . citalopram (CELEXA) 20 MG tablet Take 1 tablet (20 mg total) by mouth daily. (Patient not taking: Reported on 06/06/2019)  . conjugated estrogens (PREMARIN) vaginal cream Place 1 Applicatorful vaginally 2 (two) times a week. (Patient not taking: Reported on 06/06/2019)   No facility-administered encounter medications on file as of 06/19/2019.      ROS: Pertinent positives and negatives noted in HPI. Remainder of ROS non-contributory    No Known Allergies  BP 120/82   Pulse 64   Temp 98.1 F (36.7 C) (Oral)   Ht 5\' 6"  (1.676 m)   Wt 160 lb (72.6 kg)   SpO2 98%   BMI 25.82 kg/m   Physical Exam  Constitutional: She is oriented to person, place, and time. She appears well-developed and well-nourished. No distress.  Cardiovascular: Normal rate and regular rhythm.  Pulmonary/Chest: Effort normal and breath sounds normal. She has no wheezes. She has no rhonchi. She exhibits tenderness. She exhibits no bony tenderness, no deformity and no swelling.    Abdominal: Soft. Bowel sounds are normal. She exhibits no distension. There is no abdominal tenderness.  Musculoskeletal:        General: No edema.  Neurological: She is alert and oriented to person, place, and time.  Skin: Skin is warm and dry.  Psychiatric: She has a normal mood and affect. Her behavior is normal.     A/P:  1. Frequency of urination - likely due to increased water intake, no other symptoms - Urinalysis  2. Right-sided chest pain - likely MSK in nature but pt is very set on getting CXR so will do that today to provide reassurance - DG Chest 2 View  3.  Gastroesophageal reflux disease, esophagitis presence not specified - pt is taking protonix 1 tab q 3 days and denies any symptoms. Will continue to taper off and stop. Advised ok to restart if symptoms return. - discussed dietary modification/trigger food avoidance  4. Vaginal dryness, menopausal - recommended  pt start premarin (she has Rx at home) 1 applicator 2x/wk   5. Encounter to establish care with new doctor - CPE and labs due in 03/2020 - pt is s/p partial hysterectomy so I believe she still needs a PAP. Pt is agreeable and will make appt with me for this.  6. Allergic cough - cont with nasal saline spray - take claritin daily, add flonase daily - f/u in 4 wks if no/minimal improvement

## 2019-06-19 NOTE — Patient Instructions (Addendum)
Take claritin, zyrtec, or allergy 1 tab daily Continue with nasal saline spray Start flonase 2 sprays each nostril daily Try this for 2-3 weeks and see  Premarin 1 applicator twice per week (0.5mg )

## 2019-06-20 ENCOUNTER — Telehealth: Payer: Self-pay | Admitting: Family Medicine

## 2019-06-20 NOTE — Telephone Encounter (Signed)
Pt is calling and wanted to inform Dr. Shan Levans that she cancelled her appointment and has established care at another office. She wanted to let her know that she has appreciated everything that Dr. Shan Levans has done for her but that she found another [facility] provider that was much closer to her house.

## 2019-06-24 ENCOUNTER — Telehealth: Payer: Self-pay | Admitting: Gastroenterology

## 2019-06-24 ENCOUNTER — Ambulatory Visit: Payer: Medicare HMO | Admitting: Family Medicine

## 2019-06-24 NOTE — Telephone Encounter (Signed)
Advised the results are not in yet. It can take up to 3 weeks. She will be contacted as soon as the results are reviewed.

## 2019-06-27 ENCOUNTER — Encounter: Payer: Self-pay | Admitting: Gastroenterology

## 2019-07-17 ENCOUNTER — Ambulatory Visit: Payer: Medicare HMO | Admitting: Family Medicine

## 2019-07-22 ENCOUNTER — Other Ambulatory Visit: Payer: Self-pay

## 2019-07-23 ENCOUNTER — Ambulatory Visit (INDEPENDENT_AMBULATORY_CARE_PROVIDER_SITE_OTHER): Payer: Medicare HMO | Admitting: Family Medicine

## 2019-07-23 ENCOUNTER — Encounter: Payer: Self-pay | Admitting: Family Medicine

## 2019-07-23 VITALS — BP 128/80 | HR 65 | Temp 98.4°F | Ht 66.0 in | Wt 157.0 lb

## 2019-07-23 DIAGNOSIS — F419 Anxiety disorder, unspecified: Secondary | ICD-10-CM

## 2019-07-23 DIAGNOSIS — Z23 Encounter for immunization: Secondary | ICD-10-CM

## 2019-07-23 DIAGNOSIS — N951 Menopausal and female climacteric states: Secondary | ICD-10-CM | POA: Diagnosis not present

## 2019-07-23 DIAGNOSIS — K219 Gastro-esophageal reflux disease without esophagitis: Secondary | ICD-10-CM

## 2019-07-23 NOTE — Progress Notes (Signed)
Lisa Spence is a 68 y.o. female  Chief Complaint  Patient presents with  . Follow-up  . Gynecologic Exam    HPI: Lisa Spence is a 68 y.o. female here for her flu vaccine today. She has never had the flu vaccine. She has never had a pneumonia vaccine and would like that today as well.  She started using premarin 2x/wk and feels it has made a dramatic difference. She plans to continue using it.  She is trying to wean off of the pantoprazole, taking 1 every 3 days. She takes pepcid occasionally. She wonders if she can try Tums PRN instead of pepcid.  She notes significant improvement in allergy symptoms with flonase, zyrtec, saline spray. She states symptoms are 75% better. Rt rib and chest pain improved/resolved with ibuprofen 600mg  BID x 5 days and daily heating pad.  She notes "tingling" in fingers and knees in the AM that occurs some days and not others. At times her lips feel "tingly" as well. Symptoms started about 4 weeks ago when she started citalopram. She feels citalopram has been effective and would like to continue it.   Past Medical History:  Diagnosis Date  . Allergy   . Anxiety   . GERD (gastroesophageal reflux disease)     Past Surgical History:  Procedure Laterality Date  . ABDOMINAL HYSTERECTOMY    . COLONOSCOPY    . ORIF ANKLE FRACTURE Left 07/09/2017   Procedure: OPEN REDUCTION INTERNAL FIXATION (ORIF) ANKLE FRACTURE;  Surgeon: Nicholes Stairs, MD;  Location: [facility];  Service: Orthopedics;  Laterality: Left;  90 mins    Social History   Socioeconomic History  . Marital status: Single    Spouse name: Not on file  . Number of children: Not on file  . Years of education: Not on file  . Highest education level: Not on file  Occupational History  . Not on file  Social Needs  . Financial resource strain: Not on file  . Food insecurity    Worry: Not on file    Inability: Not on file  . Transportation needs    Medical: Not on file   Non-medical: Not on file  Tobacco Use  . Smoking status: Never Smoker  . Smokeless tobacco: Never Used  Substance and Sexual Activity  . Alcohol use: No    Comment: no longer drinks, drank 6 glasses wine/week stopped in 2007  . Drug use: No  . Sexual activity: Not Currently  Lifestyle  . Physical activity    Days per week: Not on file    Minutes per session: Not on file  . Stress: Not on file  Relationships  . Social Herbalist on phone: Not on file    Gets together: Not on file    Attends religious service: Not on file    Active member of club or organization: Not on file    Attends meetings of clubs or organizations: Not on file    Relationship status: Not on file  . Intimate partner violence    Fear of current or ex partner: Not on file    Emotionally abused: Not on file    Physically abused: Not on file    Forced sexual activity: Not on file  Other Topics Concern  . Not on file  Social History Narrative  . Not on file    Family History  Problem Relation Age of Onset  . Heart disease Mother   . Heart  disease Father   . Colon cancer Neg Hx   . Esophageal cancer Neg Hx   . Rectal cancer Neg Hx   . Stomach cancer Neg Hx       There is no immunization history on file for this patient.  Outpatient Encounter Medications as of 07/23/2019  Medication Sig  . citalopram (CELEXA) 20 MG tablet Take 1 tablet (20 mg total) by mouth daily.  . clonazePAM (KLONOPIN) 0.5 MG tablet Take 0.5 mg by mouth 2 (two) times daily as needed for anxiety. 1-2 tablets twice daily as needed  . conjugated estrogens (PREMARIN) vaginal cream Place 1 Applicatorful vaginally 2 (two) times a week. (Patient not taking: Reported on 06/06/2019)  . famotidine (PEPCID) 10 MG tablet Take 10 mg by mouth as needed for heartburn or indigestion.  . pantoprazole (PROTONIX) 20 MG tablet Take 20 mg by mouth daily as needed for heartburn.   . traMADol (ULTRAM) 50 MG tablet Take 1-2 tablets (50-100 mg  total) by mouth every 6 (six) hours as needed for moderate pain.   No facility-administered encounter medications on file as of 07/23/2019.      ROS: Gen: no fever, chills  Skin: no rash, itching ENT: as above in HPI Eyes: no blurry vision, double vision Resp: no cough, wheeze,SOB CV: no CP, palpitations, LE edema,  GI: no heartburn, n/v/d/c, abd pain GU: no dysuria, urgency, frequency, hematuria  MSK: no joint pain, myalgias, back pain Neuro: no dizziness, headache, weakness    No Known Allergies  BP 128/80   Pulse 65   Temp 98.4 F (36.9 C) (Oral)   Ht 5\' 6"  (1.676 m)   Wt 157 lb (71.2 kg)   SpO2 96%   BMI 25.34 kg/m   Physical Exam  Constitutional: She is oriented to person, place, and time. She appears well-developed and well-nourished. No distress.  HENT:  Right Ear: Tympanic membrane and ear canal normal.  Left Ear: Tympanic membrane and ear canal normal.  Nose: Nose normal.  Mouth/Throat: Oropharynx is clear and moist and mucous membranes are normal.  Cardiovascular: Normal rate, regular rhythm and intact distal pulses.  Pulmonary/Chest: Effort normal and breath sounds normal.  Abdominal: Soft. Bowel sounds are normal. She exhibits no distension. There is no abdominal tenderness.  Musculoskeletal:        General: No edema.  Neurological: She is alert and oriented to person, place, and time.  Skin: Skin is warm and dry.  Psychiatric: She has a normal mood and affect.     A/P:  1. Need for pneumococcal vaccine - Pneumococcal conjugate vaccine 13-valent IM - PCV23 in 1 year  2. Need for influenza vaccination - Flu Vaccine QUAD High Dose(Fluad)  3. Gastroesophageal reflux disease, unspecified whether esophagitis present - pt is tapering off of pantoprazole, taking 1 tab q3 days - ok to take Tums PRN - cont diet modification, weight loss  4. Vaginal dryness, menopausal - much-improved with premarin, cont this  5. Anxiety - pt notes improvement on  citalopram - ? Side effect of intermittent tingling in fingers, knees, lips. Pt would like to continue med x 3-4 more weeks. If symptoms worsen, she will contact me. If no improvement/resolution, will stop citalopram x 2 wks to see if tingling resolves

## 2019-08-12 ENCOUNTER — Ambulatory Visit: Payer: Self-pay | Admitting: *Deleted

## 2019-08-12 NOTE — Telephone Encounter (Signed)
Pt called and stated that she has a dry cough due to allergies; she was on a regimen of flonase, saline spray, and allergy tablets; she also has congestion; the pt also says that she had an earache last week but it is now gone; the pt says that she is not coughing anything up, and she can still feel the mucus in the back of her throat and nose; recommendations made per nurse triage protocol; she verbalized understanding; the pt would also like to know if Dr Bryan Lemma would like for her to resume the previously discussed regimen; the pt opted not to make an appt, and would like to know Dr Creta Levin thoughts; she can be contacted at [phone]; will route to office for final disposition.   Reason for Disposition . Cough has been present for > 3 weeks  Answer Assessment - Initial Assessment Questions 1. ONSET: "When did the cough begin?"      Seen in office 06/24/2019 2. SEVERITY: "How bad is the cough today?"     Mild to moderare 3. RESPIRATORY DISTRESS: "Describe your breathing."     Breathing ok 4. FEVER: "Do you have a fever?" If so, ask: "What is your temperature, how was it measured, and when did it start?"    no 5. HEMOPTYSIS: "Are you coughing up any blood?" If so ask: "How much?" (flecks, streaks, tablespoons, etc.)    no 6. TREATMENT: "What have you done so far to treat the cough?" (e.g., meds, fluids, humidifier)     Flonase, saline spray, claritin 7. CARDIAC HISTORY: "Do you have any history of heart disease?" (e.g., heart attack, congestive heart failure)     no 8. LUNG HISTORY: "Do you have any history of lung disease?"  (e.g., pulmonary embolus, asthma, emphysema)   no 9. PE RISK FACTORS: "Do you have a history of blood clots?" (or: recent major surgery, recent prolonged travel, bedridden)   no 10. OTHER SYMPTOMS: "Do you have any other symptoms? (e.g., runny nose, wheezing, chest pain)     Nasal congestion 11. PREGNANCY: "Is there any chance you are pregnant?" "When was  your last menstrual period?"      no 12. TRAVEL: "Have you traveled out of the country in the last month?" (e.g., travel history, exposures)       no  Protocols used: COUGH - ACUTE NON-PRODUCTIVE-A-AH

## 2019-08-12 NOTE — Telephone Encounter (Signed)
Pt. Called back and given instructions. Verbalizes understanding.States she appreciates Sarah and Dr. Bryan Lemma.

## 2019-08-12 NOTE — Telephone Encounter (Signed)
Pt should resume previous regimen of nasal saline spray 3x/day, flonase 2 sprays each nostril daily, and claritin/zyrtec/allegra 1 tab daily. She can take benadryl 25mg  at night for a few nights if needed or she can add mucinex 1 tab 2x/day for 4-5 days

## 2019-08-12 NOTE — Telephone Encounter (Signed)
lmovm x 1. Triage nurse may discuss the below information with pt.

## 2019-08-14 ENCOUNTER — Other Ambulatory Visit: Payer: Self-pay

## 2019-08-14 DIAGNOSIS — Z20822 Contact with and (suspected) exposure to covid-19: Secondary | ICD-10-CM

## 2019-08-16 LAB — NOVEL CORONAVIRUS, NAA: SARS-CoV-2, NAA: NOT DETECTED

## 2019-09-09 ENCOUNTER — Other Ambulatory Visit: Payer: Self-pay

## 2019-09-09 ENCOUNTER — Telehealth: Payer: Self-pay | Admitting: Family Medicine

## 2019-09-09 DIAGNOSIS — F419 Anxiety disorder, unspecified: Secondary | ICD-10-CM

## 2019-09-09 MED ORDER — CLONAZEPAM 0.5 MG PO TABS: 0.5000 mg | ORAL_TABLET | Freq: Two times a day (BID) | ORAL | 0 refills | Status: DC | PRN

## 2019-09-09 NOTE — Telephone Encounter (Signed)
Pt called saying she is a new patient seeing Dr. Bryan Lemma and she needs a new prescription for  Citalopram 20 mg once a day # 55.  Klonopin 0.5 mg  She was getting these from her previous doctor.  She wants this sent to [facility]  CB# [phone]

## 2019-09-10 MED ORDER — CLONAZEPAM 0.5 MG PO TABS: 0.5000 mg | ORAL_TABLET | Freq: Two times a day (BID) | ORAL | 0 refills | Status: DC | PRN

## 2019-09-10 MED ORDER — CITALOPRAM HYDROBROMIDE 20 MG PO TABS: 20.0000 mg | ORAL_TABLET | Freq: Every day | ORAL | 3 refills | Status: DC

## 2019-09-10 NOTE — Telephone Encounter (Signed)
celexa refilled as requested. Klonopin refilled #60 with 0RF.

## 2019-10-09 ENCOUNTER — Telehealth: Payer: Self-pay | Admitting: Family Medicine

## 2019-10-09 MED ORDER — ESTROGENS, CONJUGATED 0.625 MG/GM VA CREA: 1.0000 | TOPICAL_CREAM | VAGINAL | 12 refills | Status: DC

## 2019-10-09 NOTE — Telephone Encounter (Signed)
Rx for premarin sent to pharm as requested.  Please schedule pt for VV next week to discuss tingling. She mentioned this at last OV and thought it may be due to celexa

## 2019-10-09 NOTE — Telephone Encounter (Signed)
Patient is calling is requested a prescription for Premarin sent to [facility] in [place]. CB is [phone]. Also patient stated that she is having tingling in her fingers and lips.

## 2019-10-09 NOTE — Telephone Encounter (Signed)
Spoke with Dr. Loletha Grayer prior to calling pt as she is completely full tomorrow. She states that this is an on going issue for pt and states unless pt wanted to schedule with someone else tomorrow she could schedule virtual with her next week.  Spoke with pt & she states she felt like this could wait until next week to speak with Dr. Loletha Grayer. VV was scheduled for Wednesday. Pt was also made aware that medication was sent to pharmacy. Nothing further is needed

## 2019-10-15 ENCOUNTER — Telehealth: Payer: Medicare HMO | Admitting: Family Medicine

## 2019-10-23 ENCOUNTER — Telehealth: Payer: Medicare HMO | Admitting: Family Medicine

## 2019-10-29 ENCOUNTER — Encounter: Payer: Self-pay | Admitting: Family Medicine

## 2019-10-29 ENCOUNTER — Telehealth (INDEPENDENT_AMBULATORY_CARE_PROVIDER_SITE_OTHER): Payer: Medicare HMO | Admitting: Family Medicine

## 2019-10-29 VITALS — Ht 66.0 in

## 2019-10-29 DIAGNOSIS — K5904 Chronic idiopathic constipation: Secondary | ICD-10-CM | POA: Diagnosis not present

## 2019-10-29 DIAGNOSIS — N951 Menopausal and female climacteric states: Secondary | ICD-10-CM | POA: Diagnosis not present

## 2019-10-29 DIAGNOSIS — R202 Paresthesia of skin: Secondary | ICD-10-CM

## 2019-10-29 NOTE — Progress Notes (Signed)
Virtual Visit via Video Note  I connected with Lisa Spence on 10/29/19 at  9:00 AM EST by a video enabled telemedicine application and verified that I am speaking with the correct person using two identifiers. Location patient: home Location provider: work  Persons participating in the virtual visit: patient, provider  I discussed the limitations of evaluation and management by telemedicine and the availability of in person appointments. The patient expressed understanding and agreed to proceed.  Chief Complaint  Patient presents with  . Numbness    Pt c/o tingling in rt hip x 30 days and rt hand.  Pt has had tingling in her lips and her feet as well.    HPI: Lisa Spence is a 69 y.o. female who complains of tingling in her Rt hip and Rt hand x 1 mo that is intermittent but it is present more often than not. Pt states no issues when walking or sleeping. Pt states she had pain in her Rt hip but that has resolved. She had some lip tingling 4 mo ago but that resolved. Pt is left handed but uses her computer mouse with her left hand. She does work from home and spends most of her time seated at a computer.  She is taking ibuprofen 400-600mg  daily most days and this is helpful and keeps her "pain free". She is walking more with her dog.   She is taking miralax every 5-6 days and is allowing her to have softer BM. She wants to ensure this is ok to take.  She feels the premarin cream is very effective and has eliminated her vaginal dryness and discomfort.   Past Medical History:  Diagnosis Date  . Allergy   . Anxiety   . GERD (gastroesophageal reflux disease)     Past Surgical History:  Procedure Laterality Date  . ABDOMINAL HYSTERECTOMY    . COLONOSCOPY    . ORIF ANKLE FRACTURE Left 07/09/2017   Procedure: OPEN REDUCTION INTERNAL FIXATION (ORIF) ANKLE FRACTURE;  Surgeon: Nicholes Stairs, MD;  Location: [facility];  Service: Orthopedics;  Laterality: Left;  90 mins    Family  History  Problem Relation Age of Onset  . Heart disease Mother   . Heart disease Father   . Colon cancer Neg Hx   . Esophageal cancer Neg Hx   . Rectal cancer Neg Hx   . Stomach cancer Neg Hx     Social History   Tobacco Use  . Smoking status: Never Smoker  . Smokeless tobacco: Never Used  Substance Use Topics  . Alcohol use: No    Comment: no longer drinks, drank 6 glasses wine/week stopped in 2007  . Drug use: No     Current Outpatient Medications:  .  citalopram (CELEXA) 20 MG tablet, Take 1 tablet (20 mg total) by mouth daily., Disp: 90 tablet, Rfl: 3 .  clonazePAM (KLONOPIN) 0.5 MG tablet, Take 1 tablet (0.5 mg total) by mouth 2 (two) times daily as needed for anxiety., Disp: 60 tablet, Rfl: 0 .  conjugated estrogens (PREMARIN) vaginal cream, Place 1 Applicatorful vaginally 2 (two) times a week., Disp: 30 g, Rfl: 12 .  pantoprazole (PROTONIX) 20 MG tablet, Take 20 mg by mouth daily as needed for heartburn. , Disp: , Rfl:  .  famotidine (PEPCID) 10 MG tablet, Take 10 mg by mouth as needed for heartburn or indigestion., Disp: , Rfl:  .  traMADol (ULTRAM) 50 MG tablet, Take 1-2 tablets (50-100 mg total) by mouth  every 6 (six) hours as needed for moderate pain., Disp: 30 tablet, Rfl: 0  No Known Allergies    ROS: See pertinent positives and negatives per HPI.   EXAM:  VITALS per patient if applicable: Ht 5\' 6"  (1.676 m)   BMI 25.34 kg/m    GENERAL: alert, oriented, appears well and in no acute distress  HEENT: atraumatic, conjunctiva clear, no obvious abnormalities on inspection of external nose and ears  NECK: normal movements of the head and neck  LUNGS: on inspection no signs of respiratory distress, breathing rate appears normal, no obvious gross SOB, gasping or wheezing, no conversational dyspnea  CV: no obvious cyanosis  MS: moves all visible extremities without noticeable abnormality  PSYCH/NEURO: pleasant and cooperative, speech and thought processing  grossly intact   ASSESSMENT AND PLAN: 1. Vaginal dryness, menopausal - resolved with use of premarin cream  2. Chronic idiopathic constipation - soft, regular BM with weekly miralax  3. Tingling sensation - occurs when seated at desk or seated up in bed, resolves with repositioning - seems circulatory in nature - recommend pt ergonomically optimize her work space and take frequent breaks to get up and walk around  - f/u if symptoms worsen or new ones develop    I discussed the assessment and treatment plan with the patient. The patient was provided an opportunity to ask questions and all were answered. The patient agreed with the plan and demonstrated an understanding of the instructions.   The patient was advised to call back or seek an in-person evaluation if the symptoms worsen or if the condition fails to improve as anticipated.   Letta Median, DO

## 2019-10-29 NOTE — Patient Instructions (Signed)
Health Maintenance Due  Topic Date Due  . TETANUS/TDAP  12/13/1969    Depression screen PHQ 2/9 04/25/2019  Decreased Interest 0  Down, Depressed, Hopeless 0  PHQ - 2 Score 0

## 2019-11-13 ENCOUNTER — Other Ambulatory Visit: Payer: Self-pay | Admitting: Family Medicine

## 2019-11-13 NOTE — Telephone Encounter (Signed)
Rx refilled as requested.  

## 2019-11-13 NOTE — Telephone Encounter (Signed)
Refill request for pending Rx last visit 10/29/19 last visit for anxiety 09/09/19. Please advise.

## 2019-12-12 ENCOUNTER — Telehealth: Payer: Self-pay | Admitting: Family Medicine

## 2019-12-12 NOTE — Telephone Encounter (Signed)
Left message for patient to schedule Annual Wellness Visit.  Please schedule with Nurse Health Advisor Victoria Britt, RN at [facility]  

## 2020-01-15 ENCOUNTER — Telehealth: Payer: Self-pay | Admitting: Family Medicine

## 2020-01-15 NOTE — Telephone Encounter (Signed)
pls schedule pt for VV to discuss

## 2020-01-15 NOTE — Telephone Encounter (Signed)
Patient called and would like her Celexa dosage increased, maybe to 1.5 or 2 tabs per day.

## 2020-01-15 NOTE — Telephone Encounter (Signed)
Dr. Loletha Grayer please advise.  Pt would like her Celexa dosage increased to maybe 1.5 or 2 tabs per day. She doesn't feel like current dose is helping.

## 2020-01-16 NOTE — Telephone Encounter (Signed)
Left patient a voicemail to call an schedule Virtual Visit for medication changes.

## 2020-01-21 ENCOUNTER — Encounter: Payer: Self-pay | Admitting: Family Medicine

## 2020-01-21 ENCOUNTER — Telehealth (INDEPENDENT_AMBULATORY_CARE_PROVIDER_SITE_OTHER): Payer: Medicare HMO | Admitting: Family Medicine

## 2020-01-21 VITALS — Ht 66.0 in

## 2020-01-21 DIAGNOSIS — M545 Low back pain, unspecified: Secondary | ICD-10-CM

## 2020-01-21 DIAGNOSIS — G8929 Other chronic pain: Secondary | ICD-10-CM | POA: Diagnosis not present

## 2020-01-21 DIAGNOSIS — Z13820 Encounter for screening for osteoporosis: Secondary | ICD-10-CM | POA: Diagnosis not present

## 2020-01-21 DIAGNOSIS — F419 Anxiety disorder, unspecified: Secondary | ICD-10-CM | POA: Diagnosis not present

## 2020-01-21 MED ORDER — CITALOPRAM HYDROBROMIDE 40 MG PO TABS: 40.0000 mg | ORAL_TABLET | Freq: Every day | ORAL | 3 refills | Status: DC

## 2020-01-21 MED ORDER — CLONAZEPAM 0.5 MG PO TABS: 0.5000 mg | ORAL_TABLET | Freq: Two times a day (BID) | ORAL | 1 refills | Status: DC | PRN

## 2020-01-21 NOTE — Progress Notes (Signed)
Virtual Visit via Video Note  I connected with Lisa Spence on 01/21/20 at  2:30 PM EDT by a video enabled telemedicine application and verified that I am speaking with the correct person using two identifiers. Location patient: home Location provider: work  Persons participating in the virtual visit: patient, provider  I discussed the limitations of evaluation and management by telemedicine and the availability of in person appointments. The patient expressed understanding and agreed to proceed.  Chief Complaint  Patient presents with  . Medication Dose Change    Pt would like to discuss Citalopram and the Clonazepam.     HPI: Lisa Spence is a 69 y.o. female who complains of increased anxiety on citalopram 20mg  daily and klonopin 0.5mg  PRN. She takes klonopin qHS about 3-4 nights per week. She also take 1-2 tabs in the afternoon PRN with increased anxiety. On those days, she states she does not have trouble falling asleep.  Pt feels she has been "very short", "edgy", "snappy" in the past few weeks. She wonders about increasing the dose of her citalopram.  She is not sleeping well which is not unusual for pt. She states she typically sleeps 3-5hrs/night.   She also notes ongoing Rt low back and hip tingling. No radiation. No pain. No weakness.   Past Medical History:  Diagnosis Date  . Allergy   . Anxiety   . GERD (gastroesophageal reflux disease)     Past Surgical History:  Procedure Laterality Date  . ABDOMINAL HYSTERECTOMY    . COLONOSCOPY    . ORIF ANKLE FRACTURE Left 07/09/2017   Procedure: OPEN REDUCTION INTERNAL FIXATION (ORIF) ANKLE FRACTURE;  Surgeon: Nicholes Stairs, MD;  Location: [facility];  Service: Orthopedics;  Laterality: Left;  90 mins    Family History  Problem Relation Age of Onset  . Heart disease Mother   . Heart disease Father   . Colon cancer Neg Hx   . Esophageal cancer Neg Hx   . Rectal cancer Neg Hx   . Stomach cancer Neg Hx     Social  History   Tobacco Use  . Smoking status: Never Smoker  . Smokeless tobacco: Never Used  Substance Use Topics  . Alcohol use: No    Comment: no longer drinks, drank 6 glasses wine/week stopped in 2007  . Drug use: No     Current Outpatient Medications:  .  clonazePAM (KLONOPIN) 0.5 MG tablet, TAKE 1 TABLET(0.5 MG) BY MOUTH TWICE DAILY AS NEEDED FOR ANXIETY, Disp: 60 tablet, Rfl: 0 .  conjugated estrogens (PREMARIN) vaginal cream, Place 1 Applicatorful vaginally 2 (two) times a week., Disp: 30 g, Rfl: 12 .  citalopram (CELEXA) 40 MG tablet, Take 1 tablet (40 mg total) by mouth daily., Disp: 90 tablet, Rfl: 3  No Known Allergies    ROS: See pertinent positives and negatives per HPI.   EXAM:  VITALS per patient if applicable: Ht 5\' 6"  (1.676 m)   BMI 25.34 kg/m    GENERAL: alert, oriented, appears well and in no acute distress  NECK: normal movements of the head and neck  LUNGS: on inspection no signs of respiratory distress, breathing rate appears normal, no obvious gross SOB, gasping or wheezing, no conversational dyspnea  CV: no obvious cyanosis  PSYCH/NEURO: pleasant and cooperative, speech and thought processing grossly intact   ASSESSMENT AND PLAN:  1. Anxiety Increase: - citalopram (CELEXA) 40 MG tablet; Take 1 tablet (40 mg total) by mouth daily.  Dispense: 90 tablet;  Refill: 3 - increased from 20mg  daily Refill: - clonazePAM (KLONOPIN) 0.5 MG tablet; Take 1 tablet (0.5 mg total) by mouth 2 (two) times daily as needed for anxiety.  Dispense: 60 tablet; Refill: 1 - f/u in 2-3 wks  2. Chronic right-sided low back pain without sciatica - DG Lumbar Spine Complete; Future - DG Hip Unilat W OR W/O Pelvis 2-3 Views Right; Future  3. Screening for osteoporosis - DG Bone Density; Future      I discussed the assessment and treatment plan with the patient. The patient was provided an opportunity to ask questions and all were answered. The patient agreed with the  plan and demonstrated an understanding of the instructions.   The patient was advised to call back or seek an in-person evaluation if the symptoms worsen or if the condition fails to improve as anticipated.   Letta Median, DO

## 2020-02-24 NOTE — Progress Notes (Deleted)
Subjective:   Lisa Spence is a 69 y.o. female who presents for an Initial Medicare Annual Wellness Visit.  Review of Systems    Home Safety/Smoke Alarms: Feels safe in home. Smoke alarms in place.   Female:     Mammo-  06/13/19     Dexa scan- scheduled 03/09/20        CCS-06/17/19. Recall 3  Yrs.     Objective:    There were no vitals filed for this visit. There is no height or weight on file to calculate BMI.  Advanced Directives 07/11/2017 07/05/2017 06/23/2017  Does Patient Have a Medical Advance Directive? No No No  Would patient like information on creating a medical advance directive? No - Patient declined No - Patient declined -    Current Medications (verified) Outpatient Encounter Medications as of 02/25/2020  Medication Sig  . citalopram (CELEXA) 40 MG tablet Take 1 tablet (40 mg total) by mouth daily.  . clonazePAM (KLONOPIN) 0.5 MG tablet Take 1 tablet (0.5 mg total) by mouth 2 (two) times daily as needed for anxiety.  . conjugated estrogens (PREMARIN) vaginal cream Place 1 Applicatorful vaginally 2 (two) times a week.   No facility-administered encounter medications on file as of 02/25/2020.    Allergies (verified) Patient has no known allergies.   History: Past Medical History:  Diagnosis Date  . Allergy   . Anxiety   . GERD (gastroesophageal reflux disease)    Past Surgical History:  Procedure Laterality Date  . ABDOMINAL HYSTERECTOMY    . COLONOSCOPY    . ORIF ANKLE FRACTURE Left 07/09/2017   Procedure: OPEN REDUCTION INTERNAL FIXATION (ORIF) ANKLE FRACTURE;  Surgeon: Nicholes Stairs, MD;  Location: [employer];  Service: Orthopedics;  Laterality: Left;  90 mins   Family History  Problem Relation Age of Onset  . Heart disease Mother   . Heart disease Father   . Colon cancer Neg Hx   . Esophageal cancer Neg Hx   . Rectal cancer Neg Hx   . Stomach cancer Neg Hx    Social History   Socioeconomic History  . Marital status: Single    Spouse  name: Not on file  . Number of children: Not on file  . Years of education: Not on file  . Highest education level: Not on file  Occupational History  . Not on file  Tobacco Use  . Smoking status: Never Smoker  . Smokeless tobacco: Never Used  Substance and Sexual Activity  . Alcohol use: No    Comment: no longer drinks, drank 6 glasses wine/week stopped in 2007  . Drug use: No  . Sexual activity: Not Currently  Other Topics Concern  . Not on file  Social History Narrative  . Not on file   Social Determinants of Health   Financial Resource Strain:   . Difficulty of Paying Living Expenses:   Food Insecurity:   . Worried About Charity fundraiser in the Last Year:   . Arboriculturist in the Last Year:   Transportation Needs:   . Film/video editor (Medical):   Marland Kitchen Lack of Transportation (Non-Medical):   Physical Activity:   . Days of Exercise per Week:   . Minutes of Exercise per Session:   Stress:   . Feeling of Stress :   Social Connections:   . Frequency of Communication with Friends and Family:   . Frequency of Social Gatherings with Friends and Family:   . Attends  Religious Services:   . Active Member of Clubs or Organizations:   . Attends Archivist Meetings:   Marland Kitchen Marital Status:     Tobacco Counseling Counseling given: Not Answered   Clinical Intake:                        Activities of Daily Living No flowsheet data found.   Immunizations and Health Maintenance Immunization History  Administered Date(s) Administered  . Fluad Quad(high Dose 65+) 07/23/2019  . Pneumococcal Conjugate-13 07/23/2019   Health Maintenance Due  Topic Date Due  . COVID-19 Vaccine (1) Never done  . TETANUS/TDAP  Never done    Patient Care Team: Ronnald Nian, DO as PCP - General (Family Medicine)  Indicate any recent Medical Services you may have received from other than [facility] providers in the past year (date may be approximate).       Assessment:   This is a routine wellness examination for [place]. Physical assessment deferred to PCP.  Hearing/Vision screen No exam data present  Dietary issues and exercise activities discussed:    Goals   None    Depression Screen PHQ 2/9 Scores 04/25/2019  PHQ - 2 Score 0    Fall Risk Fall Risk  04/25/2019  Falls in the past year? 0  Number falls in past yr: 0  Injury with Fall? 0  Follow up Falls evaluation completed    Is the patient's home free of loose throw rugs in walkways, pet beds, electrical cords, etc?   {Blank single:19197::"yes","no"}      Grab bars in the bathroom? {Blank single:19197::"yes","no"}      Handrails on the stairs?   {Blank single:19197::"yes","no"}      Adequate lighting?   {Blank single:19197::"yes","no"}  Timed Get Up and Go Performed ***  Cognitive Function:        Screening Tests Health Maintenance  Topic Date Due  . COVID-19 Vaccine (1) Never done  . TETANUS/TDAP  Never done  . INFLUENZA VACCINE  04/25/2020  . PNA vac Low Risk Adult (2 of 2 - PPSV23) 07/22/2020  . MAMMOGRAM  06/12/2021  . COLONOSCOPY  06/16/2022  . DEXA SCAN  Completed  . Hepatitis C Screening  Completed    Qualifies for Shingles Vaccine? ***  Cancer Screenings: Lung: Low Dose CT Chest recommended if Age 66-80 years, 30 pack-year currently smoking OR have quit w/in 15years. Patient {DOES NOT does:27190::"does not"} qualify. Breast: Up to date on Mammogram? {Yes/No:30480221}   Up to date of Bone Density/Dexa? {Yes/No:30480221} Colorectal: ***  Additional Screenings: *** Hepatitis C Screening:      Plan:   ***  I have personally reviewed and noted the following in the patient's chart:   . Medical and social history . Use of alcohol, tobacco or illicit drugs  . Current medications and supplements . Functional ability and status . Nutritional status . Physical activity . Advanced directives . List of other physicians . Hospitalizations,  surgeries, and ER visits in previous 12 months . Vitals . Screenings to include cognitive, depression, and falls . Referrals and appointments  In addition, I have reviewed and discussed with patient certain preventive protocols, quality metrics, and best practice recommendations. A written personalized care plan for preventive services as well as general preventive health recommendations were provided to patient.     Shela Nevin, [place]   02/24/2020

## 2020-02-25 ENCOUNTER — Ambulatory Visit: Payer: Medicare HMO | Admitting: *Deleted

## 2020-03-09 ENCOUNTER — Other Ambulatory Visit: Payer: Self-pay

## 2020-03-09 ENCOUNTER — Ambulatory Visit
Admission: RE | Admit: 2020-03-09 | Discharge: 2020-03-09 | Disposition: A | Discharge status: Home / Self Care | Payer: Medicare HMO | Source: Ambulatory Visit | Attending: Family Medicine | Admitting: Family Medicine

## 2020-03-09 DIAGNOSIS — Z13820 Encounter for screening for osteoporosis: Secondary | ICD-10-CM

## 2020-03-10 ENCOUNTER — Encounter: Payer: Self-pay | Admitting: Family Medicine

## 2020-03-10 DIAGNOSIS — M810 Age-related osteoporosis without current pathological fracture: Secondary | ICD-10-CM | POA: Insufficient documentation

## 2020-03-31 ENCOUNTER — Encounter: Payer: Self-pay | Admitting: Family Medicine

## 2020-03-31 ENCOUNTER — Telehealth (INDEPENDENT_AMBULATORY_CARE_PROVIDER_SITE_OTHER): Payer: Medicare HMO | Admitting: Family Medicine

## 2020-03-31 VITALS — Ht 66.0 in

## 2020-03-31 DIAGNOSIS — F419 Anxiety disorder, unspecified: Secondary | ICD-10-CM

## 2020-03-31 DIAGNOSIS — M810 Age-related osteoporosis without current pathological fracture: Secondary | ICD-10-CM

## 2020-03-31 MED ORDER — BUPROPION HCL ER (XL) 150 MG PO TB24: 150.0000 mg | ORAL_TABLET | Freq: Every day | ORAL | 1 refills | Status: DC

## 2020-03-31 NOTE — Patient Instructions (Addendum)
[facility]: [email or web address]  [email or web address]  [email or web address]  [email or web address]  [email or web address]

## 2020-03-31 NOTE — Progress Notes (Signed)
Virtual Visit via Video Note  I connected with Lisa Spence on 03/31/20 at  8:30 AM EDT by a video enabled telemedicine application and verified that I am speaking with the correct person using two identifiers. Location patient: home Location provider: work  Persons participating in the virtual visit: patient, provider  I discussed the limitations of evaluation and management by telemedicine and the availability of in person appointments. The patient expressed understanding and agreed to proceed.  Chief Complaint  Patient presents with  . lab result discussion    Pt had a bone Density test 03/09/2020.  Pt also has a question about medication Citalopram 40mg .     HPI: Lisa Spence is a 69 y.o. female seen today to discuss her recent Dexa results. I have personally reviewed the 03/09/20 result today. Pt with a T-score = -2.5. Pt is taking calcium 1300mg  daily and Vit D 5,000IU daily and has been doing so for some time.  She is walking daily, 2 Spence, x 2 mo and also lifting light weights. She is joining an Estate manager/land agent.  She is interested in Prolia injections but if not covered would be agreeable to boniva.   EXAM: DUAL X-RAY ABSORPTIOMETRY (DXA) FOR BONE MINERAL DENSITY   ASSESSMENT: The BMD measured at Femur Neck Left is 0.688 g/cm2 with a T-score of -2.5. This patient is considered osteoporotic according to [facility]) criteria. The scan quality is good. L-3 and L-4 were excluded due to degenerative changes.  Site Region Measured Date Measured Age YA BMD Significant CHANGE T-score DualFemur Neck Left  5/09/[age]         -2.5    0.688 g/cm2  AP Spine  L1-L2      5/09/2020    [age]         -1.1    1.027 g/cm2  DualFemur Total Mean 5/09/[age]         -2.2    0.733 g/cm2  World Health Organization [facility]) criteria for post-menopausal, Caucasian Women: Normal       T-score at or above -1 SD Osteopenia   T-score between -1 and  -2.5 SD Osteoporosis T-score at or below -2.5 SD   Pt is taking celexa 40mg  daily and feels this has been very helpful with her overall anxiety level, feeling of detachement. She still feels irritable, short.   Past Medical History:  Diagnosis Date  . Allergy   . Anxiety   . GERD (gastroesophageal reflux disease)     Past Surgical History:  Procedure Laterality Date  . ABDOMINAL HYSTERECTOMY    . COLONOSCOPY    . ORIF ANKLE FRACTURE Left 07/09/2017   Procedure: OPEN REDUCTION INTERNAL FIXATION (ORIF) ANKLE FRACTURE;  Surgeon: Nicholes Stairs, MD;  Location: [place];  Service: Orthopedics;  Laterality: Left;  90 mins    Family History  Problem Relation Age of Onset  . Heart disease Mother   . Heart disease Father   . Colon cancer Neg Hx   . Esophageal cancer Neg Hx   . Rectal cancer Neg Hx   . Stomach cancer Neg Hx     Social History   Tobacco Use  . Smoking status: Never Smoker  . Smokeless tobacco: Never Used  Vaping Use  . Vaping Use: Never used  Substance Use Topics  . Alcohol use: No    Comment: no longer drinks, drank 6 glasses wine/week stopped in 2007  . Drug  use: No     Current Outpatient Medications:  .  amoxicillin (AMOXIL) 500 MG capsule, Take 500 mg by mouth 3 (three) times daily., Disp: , Rfl:  .  citalopram (CELEXA) 40 MG tablet, Take 1 tablet (40 mg total) by mouth daily., Disp: 90 tablet, Rfl: 3 .  clonazePAM (KLONOPIN) 0.5 MG tablet, Take 1 tablet (0.5 mg total) by mouth 2 (two) times daily as needed for anxiety., Disp: 60 tablet, Rfl: 1 .  conjugated estrogens (PREMARIN) vaginal cream, Place 1 Applicatorful vaginally 2 (two) times a week., Disp: 30 g, Rfl: 12 .  traMADol (ULTRAM) 50 MG tablet, Take 50 mg by mouth every 6 (six) hours as needed., Disp: , Rfl:  .  chlorhexidine (PERIDEX) 0.12 % solution, 15 mLs 2 (two) times daily. (Patient not taking: Reported on 03/31/2020), Disp: , Rfl:   No Known Allergies    ROS: See pertinent positives  and negatives per HPI.   EXAM:  VITALS per patient if applicable: Ht 5\' 6"  (1.676 m)   BMI 25.34 kg/m    GENERAL: alert, oriented, appears well and in no acute distress  NECK: normal movements of the head and neck  LUNGS: on inspection no signs of respiratory distress, breathing rate appears normal, no obvious gross SOB, gasping or wheezing, no conversational dyspnea  CV: no obvious cyanosis  PSYCH/NEURO: pleasant and cooperative, no obvious depression or anxiety, speech and thought processing grossly intact   ASSESSMENT AND PLAN: 1. Age-related osteoporosis without current pathological fracture - pt is taking daily calcium and Vit D supplements, exercising regularly  - pt is agreeable to medication and would like to look into Prolia. If not covered, pt would be agreeable to boniva - will ask lead RN to look into Prolia and then f/u with pt  2. Anxiety - much-improved on celexa 40mg  daily Rx: - buPROPion (WELLBUTRIN XL) 150 MG 24 hr tablet; Take 1 tablet (150 mg total) by mouth daily.  Dispense: 90 tablet; Refill: 1 - [place] counseling resources included in AVS - f/u in 3-4 wks or sooner PRN  I discussed the assessment and treatment plan with the patient. The patient was provided an opportunity to ask questions and all were answered. The patient agreed with the plan and demonstrated an understanding of the instructions.   The patient was advised to call back or seek an in-person evaluation if the symptoms worsen or if the condition fails to improve as anticipated.   Lisa Median, DO

## 2020-04-14 NOTE — Telephone Encounter (Signed)
Error. Adell Koval, CMA  

## 2020-04-20 ENCOUNTER — Telehealth: Payer: Self-pay | Admitting: Family Medicine

## 2020-04-20 NOTE — Telephone Encounter (Signed)
Left message for patient to schedule Annual Wellness Visit.  Please schedule with Nurse Health Advisor Martha Stanley, RN at [place]  °

## 2020-04-23 ENCOUNTER — Telehealth: Payer: Self-pay | Admitting: Family Medicine

## 2020-04-23 NOTE — Telephone Encounter (Signed)
Patient is calling and wanted to speak to someone regarding what she is suppose to do next following her bone density test, please advise. CB is [phone].

## 2020-04-23 NOTE — Telephone Encounter (Signed)
Left message on voicemail to call office.  

## 2020-04-28 NOTE — Telephone Encounter (Signed)
Patient is returning the call. Pt stated that she will be available to answer the phone after 2 pm. CB is [phone]

## 2020-05-06 ENCOUNTER — Telehealth: Payer: Self-pay | Admitting: Family Medicine

## 2020-05-06 NOTE — Telephone Encounter (Signed)
  Who's calling (name and relationship to patient) : Rosealynn Mateus contact number: [phone]  Provider they see:Dr Cirigliano  Reason for call: Spoke with patient to schedule her AWV. She stated that she did a bone density test and need the results and to talk about the process.  She state she finished her medications and would like for someone to call her for further instructions what to do next.  If no one answer please leave a message.     PRESCRIPTION REFILL ONLY  Name of prescription:  Pharmacy:

## 2020-05-07 NOTE — Telephone Encounter (Signed)
Please review DEXA results and advise

## 2020-05-12 NOTE — Telephone Encounter (Signed)
Left voicemail for patient informing her we were working on the Prolia and to call our office with other questions and concerns she had.

## 2020-05-12 NOTE — Telephone Encounter (Signed)
I saw pt for VV on 03/31/20 to discuss dexa result. We had discussed prolia vs boniva and I had asked Ronnie to look into insurance coverage for boniva. Lattie Haw, can you follow-up on whether or not prolia injections for osteoporosis would be covered for this patient? Also please call pt to see what her specific concern/question is and let her know we are checking on prolia. Thanks!

## 2020-05-17 NOTE — Telephone Encounter (Signed)
Patient called back to check status of getting Prolia. Please call her to advise.

## 2020-05-17 NOTE — Telephone Encounter (Signed)
Advised patient that approval for Prolia injection is pending at this time. Patient verbalized understanding.

## 2020-05-17 NOTE — Telephone Encounter (Addendum)
Do we have any more information in regards to her Prolia?

## 2020-05-25 ENCOUNTER — Telehealth: Payer: Self-pay

## 2020-05-25 NOTE — Telephone Encounter (Signed)
Patient returned call to the office. Please give her a call back at [phone].

## 2020-05-25 NOTE — Telephone Encounter (Signed)
Attempted to reach patient to discuss copay for Prolia injection. Left message with contact info.

## 2020-05-26 NOTE — Telephone Encounter (Signed)
Advised patient regarding cost of Prolia. Patient stated she would proceed with Prolia injection if PCP was in favor of this as part of her treatment plan.

## 2020-06-08 ENCOUNTER — Telehealth: Payer: Self-pay | Admitting: Family Medicine

## 2020-06-08 NOTE — Telephone Encounter (Signed)
Left message for patient to schedule Annual Wellness Visit.  Please schedule with Nurse Health Advisor Martha Stanley, RN at [place]  °

## 2020-06-11 ENCOUNTER — Other Ambulatory Visit: Payer: Self-pay | Admitting: Family Medicine

## 2020-06-11 DIAGNOSIS — F419 Anxiety disorder, unspecified: Secondary | ICD-10-CM

## 2020-06-11 NOTE — Telephone Encounter (Signed)
Received a refill request for:  Clonazepam 0.5 mg Qty # 60, LR 01/21/20, 1 refill LOV 03/31/20   Please advise. Thanks. Dm/cma

## 2020-08-10 ENCOUNTER — Ambulatory Visit: Payer: Medicare HMO

## 2020-08-10 ENCOUNTER — Other Ambulatory Visit: Payer: Self-pay

## 2020-08-11 ENCOUNTER — Ambulatory Visit (INDEPENDENT_AMBULATORY_CARE_PROVIDER_SITE_OTHER): Payer: Medicare HMO

## 2020-08-11 DIAGNOSIS — Z23 Encounter for immunization: Secondary | ICD-10-CM | POA: Diagnosis not present

## 2020-08-11 NOTE — Patient Instructions (Signed)
Health Maintenance Due  Topic Date Due  . COVID-19 Vaccine (1) Never done  . TETANUS/TDAP  Never done  . INFLUENZA VACCINE  04/25/2020  . PNA vac Low Risk Adult (2 of 2 - PPSV23) 07/22/2020    Depression screen PHQ 2/9 04/25/2019  Decreased Interest 0  Down, Depressed, Hopeless 0  PHQ - 2 Score 0

## 2020-08-11 NOTE — Progress Notes (Signed)
Per orders of Dr. Bryan Lemma  injection of Influenza Vaccine, Lt and the Pneumoax 23 given by Paydon Carll L Khaleb Broz in right deltoid. Patient tolerated injection well.

## 2020-08-16 ENCOUNTER — Other Ambulatory Visit: Payer: Self-pay | Admitting: Family Medicine

## 2020-08-16 DIAGNOSIS — F419 Anxiety disorder, unspecified: Secondary | ICD-10-CM

## 2020-08-16 NOTE — Telephone Encounter (Signed)
Lat VV 03/31/20 Last fill 03/31/20  #90/1

## 2020-09-02 ENCOUNTER — Ambulatory Visit: Payer: Medicare HMO | Admitting: Family Medicine

## 2020-10-27 ENCOUNTER — Other Ambulatory Visit: Payer: Self-pay

## 2020-10-27 ENCOUNTER — Ambulatory Visit (INDEPENDENT_AMBULATORY_CARE_PROVIDER_SITE_OTHER): Payer: Medicare HMO | Admitting: Family Medicine

## 2020-10-27 ENCOUNTER — Encounter: Payer: Self-pay | Admitting: Family Medicine

## 2020-10-27 ENCOUNTER — Ambulatory Visit (INDEPENDENT_AMBULATORY_CARE_PROVIDER_SITE_OTHER): Payer: Medicare HMO

## 2020-10-27 VITALS — BP 132/80 | HR 87 | Temp 98.7°F | Ht 66.0 in | Wt 174.0 lb

## 2020-10-27 DIAGNOSIS — M25551 Pain in right hip: Secondary | ICD-10-CM

## 2020-10-27 DIAGNOSIS — G8929 Other chronic pain: Secondary | ICD-10-CM

## 2020-10-27 DIAGNOSIS — M7071 Other bursitis of hip, right hip: Secondary | ICD-10-CM

## 2020-10-27 DIAGNOSIS — F419 Anxiety disorder, unspecified: Secondary | ICD-10-CM

## 2020-10-27 DIAGNOSIS — R69 Illness, unspecified: Secondary | ICD-10-CM | POA: Diagnosis not present

## 2020-10-27 DIAGNOSIS — M545 Low back pain, unspecified: Secondary | ICD-10-CM

## 2020-10-27 MED ORDER — CLONAZEPAM 0.5 MG PO TABS: 0.5000 mg | ORAL_TABLET | Freq: Two times a day (BID) | ORAL | 2 refills | Status: DC | PRN

## 2020-10-27 MED ORDER — NAPROXEN 500 MG PO TABS: 500.0000 mg | ORAL_TABLET | Freq: Two times a day (BID) | ORAL | 0 refills | Status: DC

## 2020-10-27 NOTE — Progress Notes (Signed)
Lisa Spence is a 70 y.o. female  Chief Complaint  Patient presents with  . Acute Visit    RT hip pain and would like to get an xray.  Also wants to discuss meds.     HPI: Lisa Spence is a 70 y.o. female who complains of Rt hip and lateral lower back pain x months. Pain is not constant. No radiation of pain to her LE. Some tingling in RLE. She feels it when she stands up from a chair or when walking up the stairs. No LE weakness. Pain does not wake her from sleep.  She is getting a new mattress that is more firm. She has been moving boxes trying to get her house in order, more so in the pat month.  No dysuria, urgency, frequency, gross hematuria. Pt has been taking ibuprofen 600mg  daily qAM - unsure if this is helping.  She walks some days w/o issue but in the past couple weeks she had has pain with walking.   Pt is taking celexa 40mg  QOD and wellbutrin 150mg  QOD x 4 wks. She is trying to wean off these meds since she does not feel this is effective. She has looked into therapists but does not yet have an appt. She plans to start [place].    Past Medical History:  Diagnosis Date  . Allergy   . Anxiety   . GERD (gastroesophageal reflux disease)     Past Surgical History:  Procedure Laterality Date  . ABDOMINAL HYSTERECTOMY    . COLONOSCOPY    . ORIF ANKLE FRACTURE Left 07/09/2017   Procedure: OPEN REDUCTION INTERNAL FIXATION (ORIF) ANKLE FRACTURE;  Surgeon: Nicholes Stairs, MD;  Location: [facility];  Service: Orthopedics;  Laterality: Left;  90 mins    Social History   Socioeconomic History  . Marital status: Single    Spouse name: Not on file  . Number of children: Not on file  . Years of education: Not on file  . Highest education level: Not on file  Occupational History  . Not on file  Tobacco Use  . Smoking status: Never Smoker  . Smokeless tobacco: Never Used  Vaping Use  . Vaping Use: Never used  Substance and Sexual Activity  . Alcohol use:  No    Comment: no longer drinks, drank 6 glasses wine/week stopped in 2007  . Drug use: No  . Sexual activity: Not Currently  Other Topics Concern  . Not on file  Social History Narrative  . Not on file   Social Determinants of Health   Financial Resource Strain: Not on file  Food Insecurity: Not on file  Transportation Needs: Not on file  Physical Activity: Not on file  Stress: Not on file  Social Connections: Not on file  Intimate Partner Violence: Not on file    Family History  Problem Relation Age of Onset  . Heart disease Mother   . Heart disease Father   . Colon cancer Neg Hx   . Esophageal cancer Neg Hx   . Rectal cancer Neg Hx   . Stomach cancer Neg Hx      Immunization History  Administered Date(s) Administered  . Fluad Quad(high Dose 65+) 07/23/2019, 08/11/2020  . PFIZER Comirnaty([place])Covid-19 Tri-Sucrose Vaccine 12/25/2019, 01/23/2020, 10/12/2020  . Pneumococcal Conjugate-13 07/23/2019  . Pneumococcal Polysaccharide-23 08/11/2020    Outpatient Encounter Medications as of 10/27/2020  Medication Sig  . buPROPion (WELLBUTRIN XL) 150 MG 24 hr tablet TAKE 1  TABLET(150 MG) BY MOUTH DAILY  . citalopram (CELEXA) 40 MG tablet Take 1 tablet (40 mg total) by mouth daily.  . clonazePAM (KLONOPIN) 0.5 MG tablet TAKE 1 TABLET(0.5 MG) BY MOUTH TWICE DAILY AS NEEDED FOR ANXIETY  . conjugated estrogens (PREMARIN) vaginal cream Place 1 Applicatorful vaginally 2 (two) times a week.  Marland Kitchen amoxicillin (AMOXIL) 500 MG capsule Take 500 mg by mouth 3 (three) times daily. (Patient not taking: Reported on 10/27/2020)  . chlorhexidine (PERIDEX) 0.12 % solution 15 mLs 2 (two) times daily. (Patient not taking: No sig reported)  . traMADol (ULTRAM) 50 MG tablet Take 50 mg by mouth every 6 (six) hours as needed. (Patient not taking: Reported on 10/27/2020)   No facility-administered encounter medications on file as of 10/27/2020.     ROS: Pertinent positives and negatives noted in HPI.  Remainder of ROS non-contributory    No Known Allergies  BP 132/80   Pulse 87   Temp 98.7 F (37.1 C) (Temporal)   Ht 5\' 6"  (1.676 m)   Wt 174 lb (78.9 kg)   SpO2 98%   BMI 28.08 kg/m   Physical Exam Constitutional:      General: She is not in acute distress.    Appearance: Normal appearance.  Musculoskeletal:     Right hip: Tenderness present. No crepitus. Decreased range of motion. Normal strength.     Right lower leg: No edema.     Left lower leg: No edema.  Neurological:     General: No focal deficit present.     Mental Status: She is alert and oriented to person, place, and time.     Coordination: Coordination normal.     Gait: Gait normal.  Psychiatric:        Mood and Affect: Mood normal.        Behavior: Behavior normal.      A/P:  1. Chronic right hip pain 2. Bursitis of right hip, unspecified bursa - bursitis vs arthritis - heat/ice BID - DG Hip Unilat W OR W/O Pelvis 2-3 Views Right Rx: - naproxen (NAPROSYN) 500 MG tablet; Take 1 tablet (500 mg total) by mouth 2 (two) times daily with a meal.  Dispense: 60 tablet; Refill: 0 - f/u in 3 wks or sooner if needed  3. Anxiety - stable, controlled - database reviewed and appropriate Refill: - clonazePAM (KLONOPIN) 0.5 MG tablet; Take 1 tablet (0.5 mg total) by mouth 2 (two) times daily as needed for anxiety.  Dispense: 60 tablet; Refill: 2 - f/u in 6 mo or sooner PRN   This visit occurred during the SARS-CoV-2 public health emergency.  Safety protocols were in place, including screening questions prior to the visit, additional usage of staff PPE, and extensive cleaning of exam room while observing appropriate contact time as indicated for disinfecting solutions.

## 2020-10-27 NOTE — Patient Instructions (Signed)
Use heating pad 2x/day - 15-87min on then off Once per day after heat, do exercises (see below)  Take naproxen 500mg  1 tab twice per day with food x 7-10 days. Replaces ibuprofen. Xray today - will call you with result   Hip Bursitis Rehab Ask your health care provider which exercises are safe for you. Do exercises exactly as told by your health care provider and adjust them as directed. It is normal to feel mild stretching, pulling, tightness, or discomfort as you do these exercises. Stop right away if you feel sudden pain or your pain gets worse. Do not begin these exercises until told by your health care provider. Stretching exercise This exercise warms up your muscles and joints and improves the movement and flexibility of your hip. This exercise also helps to relieve pain and stiffness. Iliotibial band stretch An iliotibial band is a strong band of muscle tissue that runs from the outer side of your hip to the outer side of your thigh and knee. 1. Lie on your side with your left / right leg in the top position. 2. Bend your left / right knee and grab your ankle. Stretch out your bottom arm to help you balance. 3. Slowly bring your knee back so your thigh is behind your body. 4. Slowly lower your knee toward the floor until you feel a gentle stretch on the outside of your left / right thigh. If you do not feel a stretch and your knee will not fall farther, place the heel of your other foot on top of your knee and pull your knee down toward the floor with your foot. 5. Hold this position for __________ seconds. 6. Slowly return to the starting position. Repeat __________ times. Complete this exercise __________ times a day.   Strengthening exercises These exercises build strength and endurance in your hip and pelvis. Endurance is the ability to use your muscles for a long time, even after they get tired. Bridge This exercise strengthens the muscles that move your thigh backward (hip  extensors). 1. Lie on your back on a firm surface with your knees bent and your feet flat on the floor. 2. Tighten your buttocks muscles and lift your buttocks off the floor until your trunk is level with your thighs. ? Do not arch your back. ? You should feel the muscles working in your buttocks and the back of your thighs. If you do not feel these muscles, slide your feet 1-2 inches (2.5-5 cm) farther away from your buttocks. ? If this exercise is too easy, try doing it with your arms crossed over your chest. 3. Hold this position for __________ seconds. 4. Slowly lower your hips to the starting position. 5. Let your muscles relax completely after each repetition. Repeat __________ times. Complete this exercise __________ times a day.   Squats This exercise strengthens the muscles in front of your thigh and knee (quadriceps). 1. Stand in front of a table, with your feet and knees pointing straight ahead. You may rest your hands on the table for balance but not for support. 2. Slowly bend your knees and lower your hips like you are going to sit in a chair. ? Keep your weight over your heels, not over your toes. ? Keep your lower legs upright so they are parallel with the table legs. ? Do not let your hips go lower than your knees. ? Do not bend lower than told by your health care provider. ? If your hip pain  increases, do not bend as low. 3. Hold the squat position for __________ seconds. 4. Slowly push with your legs to return to standing. Do not use your hands to pull yourself to standing. Repeat __________ times. Complete this exercise __________ times a day. Hip hike 1. Stand sideways on a bottom step. Stand on your left / right leg with your other foot unsupported next to the step. You can hold on to the railing or wall for balance if needed. 2. Keep your knees straight and your torso square. Then lift your left / right hip up toward the ceiling. 3. Hold this position for __________  seconds. 4. Slowly let your left / right hip lower toward the floor, past the starting position. Your foot should get closer to the floor. Do not lean or bend your knees. Repeat __________ times. Complete this exercise __________ times a day. Single leg stand 1. Without shoes, stand near a railing or in a doorway. You may hold on to the railing or door frame as needed for balance. 2. Squeeze your left / right buttock muscles, then lift up your other foot. ? Do not let your left / right hip push out to the side. ? It is helpful to stand in front of a mirror for this exercise so you can watch your hip. 3. Hold this position for __________ seconds. Repeat __________ times. Complete this exercise __________ times a day. This information is not intended to replace advice given to you by your health care provider. Make sure you discuss any questions you have with your health care provider. Document Revised: 01/06/2019 Document Reviewed: 01/06/2019 Elsevier Patient Education  [place].

## 2020-10-28 ENCOUNTER — Encounter: Payer: Self-pay | Admitting: Family Medicine

## 2020-10-28 NOTE — Telephone Encounter (Signed)
lft VM to rtn call to get more information.  Dm/cma  

## 2020-11-09 ENCOUNTER — Telehealth: Payer: Self-pay | Admitting: Family Medicine

## 2020-11-09 NOTE — Telephone Encounter (Signed)
Patient is calling to speak to the nurse or Dr. Loletha Grayer regarding her lumbar spine. She states that she needs to speak to someone before she travels on Friday. She is aware that her provider is out of the office today. Please give her a call back at [phone].

## 2020-11-10 NOTE — Telephone Encounter (Signed)
Spoke to patient and she thought she was only to take the Naproxen for 2 weeks.  She is feeling better, but went jogging 2 times and started having some pain/tigling again so stopped doing it. Advised that she was to take it twice daily and to f/u in 3 weeks or sooner with OV.  She will call back to make an appt after she gets back in town and continue to take the medication as directed.   Dm/cma

## 2020-11-10 NOTE — Telephone Encounter (Signed)
Pt called back and said she was on a meeting but she doesn't have any other meetings for the day so she should be able to answer when called back. I let her know Langley Gauss would return her call

## 2020-11-10 NOTE — Telephone Encounter (Signed)
lft VM to rtn call. Dm/cma  

## 2020-11-23 ENCOUNTER — Other Ambulatory Visit: Payer: Self-pay | Admitting: Family Medicine

## 2020-11-23 DIAGNOSIS — M7071 Other bursitis of hip, right hip: Secondary | ICD-10-CM

## 2020-11-23 DIAGNOSIS — G8929 Other chronic pain: Secondary | ICD-10-CM

## 2020-11-23 NOTE — Telephone Encounter (Signed)
Last OV 10/27/20 Last fill 10/27/20 #60/0

## 2020-12-09 NOTE — Telephone Encounter (Signed)
I spoke with pt and she is wanting a new script to take the edge off. She does not want naproxen any longer. Please advise pt.

## 2020-12-09 NOTE — Telephone Encounter (Signed)
Please see message and advise.  Thank you. ° °

## 2020-12-12 NOTE — Telephone Encounter (Signed)
Pt has appt with me on 12/17/20.  Based on her message, I'm not sure if she is looking for a different anti-inflammatory med, pain medication, other. I'm not able to Rx controlled substance w/o an appt so will address at appt on 3/25

## 2020-12-17 ENCOUNTER — Telehealth (INDEPENDENT_AMBULATORY_CARE_PROVIDER_SITE_OTHER): Payer: Medicare HMO | Admitting: Family Medicine

## 2020-12-17 ENCOUNTER — Encounter: Payer: Self-pay | Admitting: Family Medicine

## 2020-12-17 VITALS — Ht 66.0 in

## 2020-12-17 DIAGNOSIS — F419 Anxiety disorder, unspecified: Secondary | ICD-10-CM | POA: Diagnosis not present

## 2020-12-17 DIAGNOSIS — M810 Age-related osteoporosis without current pathological fracture: Secondary | ICD-10-CM | POA: Diagnosis not present

## 2020-12-17 DIAGNOSIS — R69 Illness, unspecified: Secondary | ICD-10-CM | POA: Diagnosis not present

## 2020-12-17 MED ORDER — BUSPIRONE HCL 7.5 MG PO TABS
7.5000 mg | ORAL_TABLET | Freq: Two times a day (BID) | ORAL | 2 refills | Status: DC
Start: 2020-12-17 — End: 2021-03-02

## 2020-12-17 NOTE — Progress Notes (Signed)
Virtual Visit via Video Note  I connected with Lisa Spence on 12/17/20 at  8:00 AM EDT by a video enabled telemedicine application and verified that I am speaking with the correct person using two identifiers. Location patient: home Location provider: work  Persons participating in the virtual visit: patient, provider  I discussed the limitations of evaluation and management by telemedicine and the availability of in person appointments. The patient expressed understanding and agreed to proceed.  Chief Complaint  Patient presents with  . Anxiety    Medication discussion      HPI: Lisa Spence is a 70 y.o. female seen today to discuss anxiety. This is a known issue for pt and she takes klonopin 0.5mg  BID PRN. She does not take it often.  She just started seeing a "counselor" thru her church.  Her work is "heavy" and she is empathetic to the world events/situation. She is going to become a foster parent. She walks outside/exercises regularly.  She has been tried on zoloft, wellbutrin, and celexa in the past but did not feel these were effective.   No flowsheet data found.  Past Medical History:  Diagnosis Date  . Allergy   . Anxiety   . GERD (gastroesophageal reflux disease)     Past Surgical History:  Procedure Laterality Date  . ABDOMINAL HYSTERECTOMY    . COLONOSCOPY    . ORIF ANKLE FRACTURE Left 07/09/2017   Procedure: OPEN REDUCTION INTERNAL FIXATION (ORIF) ANKLE FRACTURE;  Surgeon: Nicholes Stairs, MD;  Location: [facility];  Service: Orthopedics;  Laterality: Left;  90 mins    Family History  Problem Relation Age of Onset  . Heart disease Mother   . Heart disease Father   . Colon cancer Neg Hx   . Esophageal cancer Neg Hx   . Rectal cancer Neg Hx   . Stomach cancer Neg Hx     Social History   Tobacco Use  . Smoking status: Never Smoker  . Smokeless tobacco: Never Used  Vaping Use  . Vaping Use: Never used  Substance Use Topics  . Alcohol use: No     Comment: no longer drinks, drank 6 glasses wine/week stopped in 2007  . Drug use: No     Current Outpatient Medications:  .  clonazePAM (KLONOPIN) 0.5 MG tablet, Take 1 tablet (0.5 mg total) by mouth 2 (two) times daily as needed for anxiety., Disp: 60 tablet, Rfl: 2 .  conjugated estrogens (PREMARIN) vaginal cream, Place 1 Applicatorful vaginally 2 (two) times a week., Disp: 30 g, Rfl: 12 .  naproxen (NAPROSYN) 500 MG tablet, TAKE 1 TABLET(500 MG) BY MOUTH TWICE DAILY WITH A MEAL, Disp: 60 tablet, Rfl: 2 .  busPIRone (BUSPAR) 7.5 MG tablet, Take 1 tablet (7.5 mg total) by mouth 2 (two) times daily., Disp: 60 tablet, Rfl: 2  No Known Allergies    ROS: See pertinent positives and negatives per HPI.   EXAM:  VITALS per patient if applicable: Ht 5\' 6"  (1.676 m)   BMI 28.08 kg/m    GENERAL: alert, oriented, appears well and in no acute distress  HEENT: atraumatic, conjunctiva clear, no obvious abnormalities on inspection of external nose and ears  NECK: normal movements of the head and neck  LUNGS: on inspection no signs of respiratory distress, breathing rate appears normal, no obvious gross SOB, gasping or wheezing, no conversational dyspnea  CV: no obvious cyanosis  MS: moves all visible extremities without noticeable abnormality  PSYCH/NEURO: pleasant and cooperative,  no obvious depression or anxiety, speech and thought processing grossly intact   ASSESSMENT AND PLAN:  1. Anxiety - not controlled - seeing counselor thru church - cont PRN klonopin - pt takes rarely - database reviewed and appropriate - previously tried wellbutrin, zoloft, celexa Rx: - busPIRone (BUSPAR) 7.5 MG tablet; Take 1 tablet (7.5 mg total) by mouth 2 (two) times daily.  Dispense: 60 tablet; Refill: 2 - f/u in 4 wks or sooner PRN  2. Age-related osteoporosis without current pathological fracture - pt would like to start prolia injections, will route to RN to initiate process     I  discussed the assessment and treatment plan with the patient. The patient was provided an opportunity to ask questions and all were answered. The patient agreed with the plan and demonstrated an understanding of the instructions.   The patient was advised to call back or seek an in-person evaluation if the symptoms worsen or if the condition fails to improve as anticipated.   Letta Median, DO

## 2020-12-22 ENCOUNTER — Other Ambulatory Visit: Payer: Self-pay | Admitting: Family Medicine

## 2021-01-11 ENCOUNTER — Telehealth: Payer: Self-pay | Admitting: Family Medicine

## 2021-01-11 DIAGNOSIS — L84 Corns and callosities: Secondary | ICD-10-CM

## 2021-01-11 NOTE — Progress Notes (Signed)
I have sent off information documents to [place] today.

## 2021-01-11 NOTE — Telephone Encounter (Signed)
Pt is wanting a referral to a podiatrist to have her feet checked over. She had no specific reason as to why. Please advise pt at  [phone].  She is also wanting Prolia. She says she has spoken with Dr C about this somewhere in the past 4 to 5 weeks. She is under the impression someone is going to call her.

## 2021-01-11 NOTE — Telephone Encounter (Signed)
Left message on voicemail informing pt that we have started the process for the Prolia injection and are just waiting on feedback as to what she will pay or not.  I also told pt that I would send her message to Dr. Loletha Grayer about referral and get back to her.

## 2021-01-12 NOTE — Telephone Encounter (Signed)
Left message on voicemail to call office.  

## 2021-01-12 NOTE — Telephone Encounter (Signed)
Referral placed to [facility]

## 2021-01-12 NOTE — Telephone Encounter (Signed)
I'm happy to place a podiatry referral but need a reason for the referral. Is pt having pain, nail issues, etc?

## 2021-01-12 NOTE — Telephone Encounter (Signed)
I spoke w/pt and she informed me that she had foot pain, both feet, she said that she had calluses on the balls of her feet.  I informed her that Dr. Loletha Grayer will send in her referral request and see will hear from that office to schedule.

## 2021-01-14 ENCOUNTER — Telehealth: Payer: Self-pay

## 2021-01-14 NOTE — Telephone Encounter (Signed)
I called pt to inform her that I received letter from [place], [employer]) that her current insurance has been terminated on 01/11/2021.  Need to ask patient if she has updated insurance information.

## 2021-02-14 ENCOUNTER — Ambulatory Visit: Payer: Medicare HMO

## 2021-02-14 ENCOUNTER — Ambulatory Visit: Payer: Medicare HMO | Admitting: Podiatry

## 2021-02-14 ENCOUNTER — Encounter: Payer: Self-pay | Admitting: Podiatry

## 2021-02-14 ENCOUNTER — Other Ambulatory Visit: Payer: Self-pay

## 2021-02-14 ENCOUNTER — Ambulatory Visit (INDEPENDENT_AMBULATORY_CARE_PROVIDER_SITE_OTHER): Payer: Medicare HMO

## 2021-02-14 DIAGNOSIS — M79674 Pain in right toe(s): Secondary | ICD-10-CM

## 2021-02-14 DIAGNOSIS — Q6671 Congenital pes cavus, right foot: Secondary | ICD-10-CM

## 2021-02-14 DIAGNOSIS — B351 Tinea unguium: Secondary | ICD-10-CM

## 2021-02-14 DIAGNOSIS — Q6672 Congenital pes cavus, left foot: Secondary | ICD-10-CM | POA: Diagnosis not present

## 2021-02-14 DIAGNOSIS — M79671 Pain in right foot: Secondary | ICD-10-CM | POA: Diagnosis not present

## 2021-02-14 DIAGNOSIS — M7750 Other enthesopathy of unspecified foot: Secondary | ICD-10-CM

## 2021-02-14 DIAGNOSIS — M79675 Pain in left toe(s): Secondary | ICD-10-CM | POA: Diagnosis not present

## 2021-02-14 DIAGNOSIS — Z8781 Personal history of (healed) traumatic fracture: Secondary | ICD-10-CM | POA: Diagnosis not present

## 2021-02-14 DIAGNOSIS — M79672 Pain in left foot: Secondary | ICD-10-CM

## 2021-02-14 DIAGNOSIS — M7752 Other enthesopathy of left foot: Secondary | ICD-10-CM | POA: Diagnosis not present

## 2021-02-14 DIAGNOSIS — L84 Corns and callosities: Secondary | ICD-10-CM

## 2021-02-14 NOTE — Patient Instructions (Signed)
Moisturize the feet at least a few times a week. No not apply between toes. You can use Eucerin for the moisturizer  Look at getting "fungi-nail" for the toenails  The [facility] shoe may be helpful. From an insert standpoint I like Powersteps, Superfeet. Aetrex.   It was great to meet you today!

## 2021-02-15 NOTE — Progress Notes (Signed)
Subjective:   Patient ID: Lisa Spence, female   DOB: 70 y.o.   MRN: [id number]   HPI 70 year old female presents the office today for foot exam.  She has a history of ankle fracture on the left side which recurred in October 2018.  She states it feels good but it feels "frozen" at times and unable to move the ankle much.  She has high arches resulting in calluses that she would not have checked.  Also asking for the nails be trimmed as they are elongated causing discomfort.  Denies any ulcerations.  She also discussed shoes that she should be wearing and overall general foot health.   Review of Systems  All other systems reviewed and are negative.  Past Medical History:  Diagnosis Date  . Allergy   . Anxiety   . GERD (gastroesophageal reflux disease)     Past Surgical History:  Procedure Laterality Date  . ABDOMINAL HYSTERECTOMY    . COLONOSCOPY    . ORIF ANKLE FRACTURE Left 07/09/2017   Procedure: OPEN REDUCTION INTERNAL FIXATION (ORIF) ANKLE FRACTURE;  Surgeon: Nicholes Stairs, MD;  Location: [facility];  Service: Orthopedics;  Laterality: Left;  90 mins     Current Outpatient Medications:  .  busPIRone (BUSPAR) 7.5 MG tablet, Take 1 tablet (7.5 mg total) by mouth 2 (two) times daily., Disp: 60 tablet, Rfl: 2 .  clonazePAM (KLONOPIN) 0.5 MG tablet, Take 1 tablet (0.5 mg total) by mouth 2 (two) times daily as needed for anxiety., Disp: 60 tablet, Rfl: 2 .  naproxen (NAPROSYN) 500 MG tablet, TAKE 1 TABLET(500 MG) BY MOUTH TWICE DAILY WITH A MEAL, Disp: 60 tablet, Rfl: 2 .  PFIZER-BIONT COVID-19 VAC-TRIS SUSP injection, , Disp: , Rfl:  .  PREMARIN vaginal cream, PLACE 1 APPLICATORFUL VAGINALLY 2 TIMES A WEEK, Disp: 30 g, Rfl: 5  No Known Allergies       Objective:  Physical Exam  General: AAO x3, NAD  Dermatological: Incision from prior surgery is well-healed.  The nails are mildly hypertrophic, dystrophic with yellow discoloration causing discomfort x10.  No edema,  erythema.  Mild hyperkeratotic lesions present along the first MPJs bilaterally.  No underlying ulceration or signs of infection.  Vascular: Dorsalis Pedis artery and Posterior Tibial artery pedal pulses are 2/4 bilateral with immedate capillary fill time.  There is no pain with calf compression, swelling, warmth, erythema.   Neruologic: Grossly intact via light touch bilateral.  Sensation intact with Semmes Weinstein monofilament, vibratory sensation intact.  Musculoskeletal: Cavus foot type present upon weightbearing.  Mild restriction ankle joint range of motion dorsiflexion, plantarflexion are otherwise intact.  No pain or crepitation with ankle joint range of motion.  No areas of pinpoint tenderness identified otherwise.  MMT 5/5.  Gait: Unassisted, Nonantalgic.       Assessment:   70 year old female with cavus foot type resulting hyperkeratotic lesions; symptomatic onychomycosis; history of ankle fracture     Plan:  -Treatment options discussed including all alternatives, risks, and complications -Etiology of symptoms were discussed -X-rays were obtained and reviewed with the patient.  No evidence of acute fracture identified.  Hardware intact with well-healed fractures of the ankle. -Debrided the nails x10 without any complications or bleeding.  Presents are with over-the-counter Fungi-Nail given the mild thickening discoloration. -Debrided hyperkeratotic lesions x2 without any complications or bleeding.  Discussed moisturizer to her feet daily but do not apply interdigitally. -Discussed supportive shoes.  She is wearing Brooks shoes.  Discussed the ghost series. -  Continue range of motion exercises for the ankle.    Trula Slade DPM

## 2021-02-16 ENCOUNTER — Telehealth: Payer: Self-pay | Admitting: Family Medicine

## 2021-02-16 NOTE — Telephone Encounter (Signed)
Pt is wanting a cb concerning her Prolia. She is interested in getting and would like more information about this. Please advise at [phone].

## 2021-02-17 NOTE — Telephone Encounter (Signed)
VM full

## 2021-02-17 NOTE — Telephone Encounter (Signed)
Pt called back. Advised her that ins was inactive that we have card for. Uploaded new information but pt does not have correct card. 2022 the ID # and Group # changed.  Please try to run with new insurance info.  Member ID [id number] Grp [id number]

## 2021-02-22 NOTE — Telephone Encounter (Signed)
This is the new [facility] information

## 2021-02-22 NOTE — Telephone Encounter (Signed)
Is this new ID# for [employer] or for pt's [employer]?

## 2021-02-24 NOTE — Telephone Encounter (Signed)
I sent off pt's new insurance information to [employer] and have received the SOB.  I have called to get a PA for the Prolia started.

## 2021-02-25 DIAGNOSIS — M545 Low back pain, unspecified: Secondary | ICD-10-CM | POA: Diagnosis not present

## 2021-02-25 DIAGNOSIS — M7061 Trochanteric bursitis, right hip: Secondary | ICD-10-CM | POA: Diagnosis not present

## 2021-03-01 ENCOUNTER — Telehealth: Payer: Self-pay | Admitting: Family Medicine

## 2021-03-01 ENCOUNTER — Telehealth: Payer: Self-pay

## 2021-03-01 NOTE — Telephone Encounter (Signed)
I called pt to let her know that the SOB has came back from [place] and she is eligible for the Prolia injection for 1 year. I also informed pt what her OOP cost would be, ($285) I LDM on VM for pt to call office back to schedule a visit to get injection.

## 2021-03-01 NOTE — Telephone Encounter (Signed)
Left message for patient to call back and schedule Medicare Annual Wellness Visit (AWV).   Please offer to do virtually or by telephone.   Due for AWVI  Please schedule at anytime with Nurse Health Advisor.   

## 2021-03-02 ENCOUNTER — Other Ambulatory Visit: Payer: Self-pay | Admitting: Family Medicine

## 2021-03-02 DIAGNOSIS — F419 Anxiety disorder, unspecified: Secondary | ICD-10-CM

## 2021-03-04 ENCOUNTER — Other Ambulatory Visit: Payer: Self-pay | Admitting: Family Medicine

## 2021-03-04 DIAGNOSIS — M25551 Pain in right hip: Secondary | ICD-10-CM

## 2021-03-04 DIAGNOSIS — M7071 Other bursitis of hip, right hip: Secondary | ICD-10-CM

## 2021-04-06 ENCOUNTER — Other Ambulatory Visit: Payer: Self-pay | Admitting: Family Medicine

## 2021-04-06 DIAGNOSIS — Z1231 Encounter for screening mammogram for malignant neoplasm of breast: Secondary | ICD-10-CM

## 2021-04-08 DIAGNOSIS — M7061 Trochanteric bursitis, right hip: Secondary | ICD-10-CM | POA: Diagnosis not present

## 2021-04-29 DIAGNOSIS — M7061 Trochanteric bursitis, right hip: Secondary | ICD-10-CM | POA: Diagnosis not present

## 2021-05-16 DIAGNOSIS — M7061 Trochanteric bursitis, right hip: Secondary | ICD-10-CM | POA: Diagnosis not present

## 2021-05-17 ENCOUNTER — Ambulatory Visit (INDEPENDENT_AMBULATORY_CARE_PROVIDER_SITE_OTHER): Payer: Medicare HMO | Admitting: *Deleted

## 2021-05-17 ENCOUNTER — Ambulatory Visit: Payer: Medicare HMO | Admitting: Podiatry

## 2021-05-17 DIAGNOSIS — Z0000 Encounter for general adult medical examination without abnormal findings: Secondary | ICD-10-CM

## 2021-05-17 NOTE — Progress Notes (Signed)
Subjective:   Lisa Spence is a 70 y.o. female who presents for Medicare Annual (Subsequent) preventive examination.  I connected with  Lisa Spence on 05/17/21 by a video enabled telemedicine application and verified that I am speaking with the correct person using two identifiers.   I discussed the limitations of evaluation and management by telemedicine. The patient expressed understanding and agreed to proceed.   Review of Systems    NA Cardiac Risk Factors include: advanced age (>19mn, >>55women)     Objective:    Today's Vitals   There is no height or weight on file to calculate BMI.  Advanced Directives 05/17/2021 07/11/2017 07/05/2017 06/23/2017  Does Patient Have a Medical Advance Directive? No No No No  Would patient like information on creating a medical advance directive? No - Patient declined No - Patient declined No - Patient declined -    Current Medications (verified) Outpatient Encounter Medications as of 05/17/2021  Medication Sig   busPIRone (BUSPAR) 7.5 MG tablet TAKE 1 TABLET(7.5 MG) BY MOUTH TWICE DAILY   calcium carbonate (OS-CAL) 1250 (500 Ca) MG chewable tablet Chew 1 tablet by mouth daily.   calcium-vitamin D (OSCAL WITH D) 500-200 MG-UNIT tablet Take by mouth daily with breakfast. Take one tablet daily   clonazePAM (KLONOPIN) 0.5 MG tablet Take 1 tablet (0.5 mg total) by mouth 2 (two) times daily as needed for anxiety.   naproxen (NAPROSYN) 500 MG tablet TAKE 1 TABLET(500 MG) BY MOUTH TWICE DAILY WITH A MEAL   PREMARIN vaginal cream PLACE 1 APPLICATORFUL VAGINALLY 2 TIMES A WEEK   UNABLE TO FIND [facility]   vitamin C (ASCORBIC ACID) 500 MG tablet Take 500 mg by mouth daily.   PFIZER-BIONT COVID-19 VAC-TRIS SUSP injection  (Patient not taking: Reported on 05/17/2021)   No facility-administered encounter medications on file as of 05/17/2021.    Allergies (verified) Patient has no known allergies.   History: Past Medical History:   Diagnosis Date   Allergy    Anxiety    GERD (gastroesophageal reflux disease)    Past Surgical History:  Procedure Laterality Date   ABDOMINAL HYSTERECTOMY     COLONOSCOPY     ORIF ANKLE FRACTURE Left 07/09/2017   Procedure: OPEN REDUCTION INTERNAL FIXATION (ORIF) ANKLE FRACTURE;  Surgeon: Lisa Stairs MD;  [facility]  Service: Orthopedics;  Laterality: Left;  90 mins   Family History  Problem Relation Age of Onset   Heart disease Mother    Heart disease Father    Colon cancer Neg Hx    Esophageal cancer Neg Hx    Rectal cancer Neg Hx    Stomach cancer Neg Hx    Social History   Socioeconomic History   Marital status: Single    Spouse name: Not on file   Number of children: Not on file   Years of education: Not on file   Highest education level: Not on file  Occupational History   Not on file  Tobacco Use   Smoking status: Never   Smokeless tobacco: Never  Vaping Use   Vaping Use: Never used  Substance and Sexual Activity   Alcohol use: No    Comment: no longer drinks, drank 6 glasses wine/week stopped in 2007   Drug use: No   Sexual activity: Not Currently  Other Topics Concern   Not on file  Social History Narrative   Not on file   Social Determinants of Health   Financial  Resource Strain: Low Risk    Difficulty of Paying Living Expenses: Not hard at all  Food Insecurity: No Food Insecurity   Worried About Charity fundraiser in the Last Year: Never true   Ran Out of Food in the Last Year: Never true  Transportation Needs: No Transportation Needs   Lack of Transportation (Medical): No   Lack of Transportation (Non-Medical): No  Physical Activity: Insufficiently Active   Days of Exercise per Week: 3 days   Minutes of Exercise per Session: 30 min  Stress: No Stress Concern Present   Feeling of Stress : Only a little  Social Connections: Unknown   Frequency of Communication with Friends and Family: More than three times a week    Frequency of Social Gatherings with Friends and Family: More than three times a week   Attends Religious Services: More than 4 times per year   Active Member of [employer] or Organizations: Yes   Attends Music therapist: More than 4 times per year   Marital Status: Not on file    Tobacco Counseling Counseling given: Not Answered   Clinical Intake:  Pre-visit preparation completed: Yes  Pain : No/denies pain     Nutritional Risks: None Diabetes: No  How often do you need to have someone help you when you read instructions, pamphlets, or other written materials from your doctor or pharmacy?: 1 - Never  Diabetic?  NO  Interpreter Needed?: No  Information entered by :: Leroy Kennedy LPN   Activities of Daily Living In your present state of health, do you have any difficulty performing the following activities: 05/17/2021  Hearing? N  Vision? N  Difficulty concentrating or making decisions? N  Walking or climbing Spence? N  Dressing or bathing? N  Doing errands, shopping? N  Preparing Food and eating ? N  Using the Toilet? N  In the past six months, have you accidently leaked urine? N  Do you have problems with loss of bowel control? N  Managing your Medications? N  Managing your Finances? N  Housekeeping or managing your Housekeeping? N  Some recent data might be hidden    No care team member to display  Indicate any recent Medical Services you may have received from other than [facility] providers in the past year (date may be approximate).     Assessment:   This is a routine wellness examination for Lisa Spence.  Hearing/Vision screen Hearing Screening - Comments:: No trouble hearing  Vision Screening - Comments:: Up to Date Dr. Sabra Heck  Dietary issues and exercise activities discussed: Current Exercise Habits: Home exercise routine, Type of exercise: walking (pickle ball), Time (Minutes): 45, Frequency (Times/Week): 5, Weekly Exercise (Minutes/Week): 225,  Intensity: Moderate   Goals Addressed             This Visit's Progress    Patient Stated       Focus on overall health and wellness       Depression Screen PHQ 2/9 Scores 05/17/2021 10/27/2020 04/25/2019  PHQ - 2 Score 0 0 0    Fall Risk Fall Risk  05/17/2021 10/27/2020 04/25/2019  Falls in the past year? 0 0 0  Number falls in past yr: 0 0 0  Injury with Fall? 0 0 0  Follow up Falls evaluation completed;Falls prevention discussed - Falls evaluation completed    FALL RISK PREVENTION PERTAINING TO THE HOME:  Any Spence in or around the home? Yes  If so, are there any without handrails?  No  Home free of loose throw rugs in walkways, pet beds, electrical cords, etc? Yes  Adequate lighting in your home to reduce risk of falls? Yes   ASSISTIVE DEVICES UTILIZED TO PREVENT FALLS:  Life alert? No  Use of a cane, walker or w/c? No  Grab bars in the bathroom? Yes  Shower chair or bench in shower? No  Elevated toilet seat or a handicapped toilet? No   TIMED UP AND GO:  Was the test performed? No .    Cognitive Function:  Normal cognitive status assessed by direct observation by this Nurse Health Advisor. No abnormalities found.       6CIT Screen 05/17/2021  What Year? 0 points  What month? 0 points  What time? 0 points  Count back from 20 0 points  Months in reverse 0 points  Repeat phrase 0 points  Total Score 0    Immunizations Immunization History  Administered Date(s) Administered   Fluad Quad(high Dose 65+) 07/23/2019, 08/11/2020   PFIZER Comirnaty(Gray Top)Covid-19 Tri-Sucrose Vaccine 12/25/2019, 01/23/2020, 10/12/2020   Pneumococcal Conjugate-13 07/23/2019   Pneumococcal Polysaccharide-23 08/11/2020    TDAP status: Due, Education has been provided regarding the importance of this vaccine. Advised may receive this vaccine at local pharmacy or Health Dept. Aware to provide a copy of the vaccination record if obtained from local pharmacy or Health Dept.  Verbalized acceptance and understanding.  Flu Vaccine status: Up to date  Pneumococcal vaccine status: Due, Education has been provided regarding the importance of this vaccine. Advised may receive this vaccine at local pharmacy or Health Dept. Aware to provide a copy of the vaccination record if obtained from local pharmacy or Health Dept. Verbalized acceptance and understanding.  Covid-19 vaccine status: Information provided on how to obtain vaccines.   Qualifies for Shingles Vaccine? Yes   Zostavax completed Yes   Shingrix Completed?: Yes  Screening Tests Health Maintenance  Topic Date Due   TETANUS/TDAP  Never done   Zoster Vaccines- Shingrix (1 of 2) Never done   COVID-19 Vaccine (4 - Booster for Pfizer series) 02/09/2021   INFLUENZA VACCINE  04/25/2021   MAMMOGRAM  06/12/2021   COLONOSCOPY (Pts 45-31yr Insurance coverage will need to be confirmed)  06/16/2022   DEXA SCAN  Completed   Hepatitis C Screening  Completed   PNA vac Low Risk Adult  Completed   HPV VACCINES  Aged Out    Health Maintenance  Health Maintenance Due  Topic Date Due   TETANUS/TDAP  Never done   Zoster Vaccines- Shingrix (1 of 2) Never done   COVID-19 Vaccine (4 - Booster for Pfizer series) 02/09/2021   INFLUENZA VACCINE  04/25/2021    Colorectal cancer screening: Type of screening: Colonoscopy. Completed 2022. Repeat every 3-5 years  Mammogram status: Completed  . Repeat every year  Bone Density status: Completed 2021. Results reflect: Bone density results: OSTEOPOROSIS. Repeat every 2 years.  Lung Cancer Screening: (Low Dose CT Chest recommended if Age 68-80years, 30 pack-year currently smoking OR have quit w/in 15years.) does not qualify.   Lung Cancer Screening Referral:   Additional Screening:  Hepatitis C Screening: does qualify;  Vision Screening: Recommended annual ophthalmology exams for early detection of glaucoma and other disorders of the eye. Is the patient up to date with  their annual eye exam?  Yes  Who is the provider or what is the name of the office in which the patient attends annual eye exams? Dr. MSabra HeckIf pt is not established  with a provider, would they like to be referred to a provider to establish care? No .   Dental Screening: Recommended annual dental exams for proper oral hygiene  Community Resource Referral / Chronic Care Management: CRR required this visit?  No   CCM required this visit?  No      Plan:     I have personally reviewed and noted the following in the patient's chart:   Medical and social history Use of alcohol, tobacco or illicit drugs  Current medications and supplements including opioid prescriptions.  Functional ability and status Nutritional status Physical activity Advanced directives List of other physicians Hospitalizations, surgeries, and ER visits in previous 12 months Vitals Screenings to include cognitive, depression, and falls Referrals and appointments  In addition, I have reviewed and discussed with patient certain preventive protocols, quality metrics, and best practice recommendations. A written personalized care plan for preventive services as well as general preventive health recommendations were provided to patient.     Leroy Kennedy, LPN   [id number]   Nurse Notes: Patient was having issues with Jerrye Bushy, wanted an appointment to just be seen for this  sooner than her TOC appointment.    Appointment was scheduled.

## 2021-05-17 NOTE — Patient Instructions (Signed)
Ms. Lisa Spence , Thank you for taking time to come for your Medicare Wellness Visit. I appreciate your ongoing commitment to your health goals. Please review the following plan we discussed and let me know if I can assist you in the future.   Screening recommendations/referrals: Colonoscopy: Up to date Mammogram: up to date Bone Density: up to date Recommended yearly ophthalmology/optometry visit for glaucoma screening and checkup Recommended yearly dental visit for hygiene and checkup  Vaccinations: Influenza vaccine: up to date Pneumococcal vaccine: Education provided Tdap vaccine: Education provided Shingles vaccine: Education provided    Advanced directives: Education provided  Conditions/risks identified: na  Next appointment: 07-06-2021 @ 10:00 am   [place] 68 Years and Older, Female Preventive care refers to lifestyle choices and visits with your health care provider that can promote health and wellness. What does preventive care include? A yearly physical exam. This is also called an annual well check. Dental exams once or twice a year. Routine eye exams. Ask your health care provider how often you should have your eyes checked. Personal lifestyle choices, including: Daily care of your teeth and gums. Regular physical activity. Eating a healthy diet. Avoiding tobacco and drug use. Limiting alcohol use. Practicing safe sex. Taking low-dose aspirin every day. Taking vitamin and mineral supplements as recommended by your health care provider. What happens during an annual well check? The services and screenings done by your health care provider during your annual well check will depend on your age, overall health, lifestyle risk factors, and family history of disease. Counseling  Your health care provider may ask you questions about your: Alcohol use. Tobacco use. Drug use. Emotional well-being. Home and relationship well-being. Sexual  activity. Eating habits. History of falls. Memory and ability to understand (cognition). Work and work Statistician. Reproductive health. Screening  You may have the following tests or measurements: Height, weight, and BMI. Blood pressure. Lipid and cholesterol levels. These may be checked every 5 years, or more frequently if you are over 68 years old. Skin check. Lung cancer screening. You may have this screening every year starting at age 68 if you have a 30-pack-year history of smoking and currently smoke or have quit within the past 15 years. Fecal occult blood test (FOBT) of the stool. You may have this test every year starting at age 68. Flexible sigmoidoscopy or colonoscopy. You may have a sigmoidoscopy every 5 years or a colonoscopy every 10 years starting at age 68. Hepatitis C blood test. Hepatitis B blood test. Sexually transmitted disease (STD) testing. Diabetes screening. This is done by checking your blood sugar (glucose) after you have not eaten for a while (fasting). You may have this done every 1-3 years. Bone density scan. This is done to screen for osteoporosis. You may have this done starting at age 68. Mammogram. This may be done every 1-2 years. Talk to your health care provider about how often you should have regular mammograms. Talk with your health care provider about your test results, treatment options, and if necessary, the need for more tests. Vaccines  Your health care provider may recommend certain vaccines, such as: Influenza vaccine. This is recommended every year. Tetanus, diphtheria, and acellular pertussis (Tdap, Td) vaccine. You may need a Td booster every 10 years. Zoster vaccine. You may need this after age 68. Pneumococcal 13-valent conjugate (PCV13) vaccine. One dose is recommended after age 68. Pneumococcal polysaccharide (PPSV23) vaccine. One dose is recommended after age 68. Talk to your health care  provider about which screenings and vaccines  you need and how often you need them. This information is not intended to replace advice given to you by your health care provider. Make sure you discuss any questions you have with your health care provider. Document Released: 10/08/2015 Document Revised: 05/31/2016 Document Reviewed: 07/13/2015 Elsevier Interactive Patient Education  2017 [place] Prevention in the Home Falls can cause injuries. They can happen to people of all ages. There are many things you can do to make your home safe and to help prevent falls. What can I do on the outside of my home? Regularly fix the edges of walkways and driveways and fix any cracks. Remove anything that might make you trip as you walk through a door, such as a raised step or threshold. Trim any bushes or trees on the path to your home. Use bright outdoor lighting. Clear any walking paths of anything that might make someone trip, such as rocks or tools. Regularly check to see if handrails are loose or broken. Make sure that both sides of any steps have handrails. Any raised decks and porches should have guardrails on the edges. Have any leaves, snow, or ice cleared regularly. Use sand or salt on walking paths during winter. Clean up any spills in your garage right away. This includes oil or grease spills. What can I do in the bathroom? Use night lights. Install grab bars by the toilet and in the tub and shower. Do not use towel bars as grab bars. Use non-skid mats or decals in the tub or shower. If you need to sit down in the shower, use a plastic, non-slip stool. Keep the floor dry. Clean up any water that spills on the floor as soon as it happens. Remove soap buildup in the tub or shower regularly. Attach bath mats securely with double-sided non-slip rug tape. Do not have throw rugs and other things on the floor that can make you trip. What can I do in the bedroom? Use night lights. Make sure that you have a light by your bed that  is easy to reach. Do not use any sheets or blankets that are too big for your bed. They should not hang down onto the floor. Have a firm chair that has side arms. You can use this for support while you get dressed. Do not have throw rugs and other things on the floor that can make you trip. What can I do in the kitchen? Clean up any spills right away. Avoid walking on wet floors. Keep items that you use a lot in easy-to-reach places. If you need to reach something above you, use a strong step stool that has a grab bar. Keep electrical cords out of the way. Do not use floor polish or wax that makes floors slippery. If you must use wax, use non-skid floor wax. Do not have throw rugs and other things on the floor that can make you trip. What can I do with my stairs? Do not leave any items on the stairs. Make sure that there are handrails on both sides of the stairs and use them. Fix handrails that are broken or loose. Make sure that handrails are as long as the stairways. Check any carpeting to make sure that it is firmly attached to the stairs. Fix any carpet that is loose or worn. Avoid having throw rugs at the top or bottom of the stairs. If you do have throw rugs, attach them to the  floor with carpet tape. Make sure that you have a light switch at the top of the stairs and the bottom of the stairs. If you do not have them, ask someone to add them for you. What else can I do to help prevent falls? Wear shoes that: Do not have high heels. Have rubber bottoms. Are comfortable and fit you well. Are closed at the toe. Do not wear sandals. If you use a stepladder: Make sure that it is fully opened. Do not climb a closed stepladder. Make sure that both sides of the stepladder are locked into place. Ask someone to hold it for you, if possible. Clearly mark and make sure that you can see: Any grab bars or handrails. First and last steps. Where the edge of each step is. Use tools that help you  move around (mobility aids) if they are needed. These include: Canes. Walkers. Scooters. Crutches. Turn on the lights when you go into a dark area. Replace any light bulbs as soon as they burn out. Set up your furniture so you have a clear path. Avoid moving your furniture around. If any of your floors are uneven, fix them. If there are any pets around you, be aware of where they are. Review your medicines with your doctor. Some medicines can make you feel dizzy. This can increase your chance of falling. Ask your doctor what other things that you can do to help prevent falls. This information is not intended to replace advice given to you by your health care provider. Make sure you discuss any questions you have with your health care provider. Document Released: 07/08/2009 Document Revised: 02/17/2016 Document Reviewed: 10/16/2014 Elsevier Interactive Patient Education  2017 [employer].

## 2021-05-25 DIAGNOSIS — L719 Rosacea, unspecified: Secondary | ICD-10-CM | POA: Diagnosis not present

## 2021-05-25 DIAGNOSIS — Z411 Encounter for cosmetic surgery: Secondary | ICD-10-CM | POA: Diagnosis not present

## 2021-05-25 DIAGNOSIS — L304 Erythema intertrigo: Secondary | ICD-10-CM | POA: Diagnosis not present

## 2021-05-25 DIAGNOSIS — L821 Other seborrheic keratosis: Secondary | ICD-10-CM | POA: Diagnosis not present

## 2021-05-26 DIAGNOSIS — M7061 Trochanteric bursitis, right hip: Secondary | ICD-10-CM | POA: Diagnosis not present

## 2021-05-27 ENCOUNTER — Other Ambulatory Visit: Payer: Self-pay

## 2021-05-27 ENCOUNTER — Ambulatory Visit (INDEPENDENT_AMBULATORY_CARE_PROVIDER_SITE_OTHER): Payer: Medicare HMO | Admitting: Family Medicine

## 2021-05-27 ENCOUNTER — Encounter: Payer: Self-pay | Admitting: Family Medicine

## 2021-05-27 VITALS — BP 115/70 | HR 76 | Temp 97.1°F | Ht 66.0 in | Wt 178.2 lb

## 2021-05-27 DIAGNOSIS — M7071 Other bursitis of hip, right hip: Secondary | ICD-10-CM

## 2021-05-27 DIAGNOSIS — R69 Illness, unspecified: Secondary | ICD-10-CM | POA: Diagnosis not present

## 2021-05-27 DIAGNOSIS — F419 Anxiety disorder, unspecified: Secondary | ICD-10-CM

## 2021-05-27 DIAGNOSIS — K219 Gastro-esophageal reflux disease without esophagitis: Secondary | ICD-10-CM | POA: Diagnosis not present

## 2021-05-27 DIAGNOSIS — M7061 Trochanteric bursitis, right hip: Secondary | ICD-10-CM | POA: Diagnosis not present

## 2021-05-27 MED ORDER — PANTOPRAZOLE SODIUM 40 MG PO TBEC: 40.0000 mg | DELAYED_RELEASE_TABLET | Freq: Every day | ORAL | 1 refills | Status: DC

## 2021-05-27 NOTE — Progress Notes (Addendum)
Established Patient Office Visit  Subjective:  Patient ID: Lisa Spence, female    DOB: 1951/03/13  Age: 70 y.o. MRN: [id number]  CC:  Chief Complaint  Patient presents with   Gastroesophageal Reflux    Concerns about reflux issues medications does not seem to be working.     HPI Lisa Spence presents for follow-up of reflux disease, anxiety, hip bursitis.  Currently under orthopedic care for hip bursitis.  Received an injection and is in physical therapy.  It is still bothering her.  She is not been taking Naprosyn for this issue recently.  Over the last year or so reflux has increased.  She has been using Tums and famotidine as needed.  Seems to be taking more and more tolerance for any control whatsoever.  She has to sleep with her bed elevated.  She has noticed increased burping.  She does not smoke.  She has not had an EGD.  She had been on Protonix in the past with famotidine as needed and this seemed to work better for her.  She has been taking Klonopin as needed for anxiety and BuSpar on a regular basis.  Review of chart shows that she has tried Zoloft in the past without much relief.  This is been a tough year for her.  1 brother died from leukemia and another died in a motorcycle accident.  Past Medical History:  Diagnosis Date   Allergy    Anxiety    GERD (gastroesophageal reflux disease)     Past Surgical History:  Procedure Laterality Date   ABDOMINAL HYSTERECTOMY     COLONOSCOPY     ORIF ANKLE FRACTURE Left 07/09/2017   Procedure: OPEN REDUCTION INTERNAL FIXATION (ORIF) ANKLE FRACTURE;  Surgeon: Nicholes Stairs, MD;  Location: [facility];  Service: Orthopedics;  Laterality: Left;  90 mins    Family History  Problem Relation Age of Onset   Heart disease Mother    Heart disease Father    Colon cancer Neg Hx    Esophageal cancer Neg Hx    Rectal cancer Neg Hx    Stomach cancer Neg Hx     Social History   Socioeconomic History   Marital status: Single     Spouse name: Not on file   Number of children: Not on file   Years of education: Not on file   Highest education level: Not on file  Occupational History   Not on file  Tobacco Use   Smoking status: Never   Smokeless tobacco: Never  Vaping Use   Vaping Use: Never used  Substance and Sexual Activity   Alcohol use: No    Comment: no longer drinks, drank 6 glasses wine/week stopped in 2007   Drug use: No   Sexual activity: Not Currently  Other Topics Concern   Not on file  Social History Narrative   Not on file   Social Determinants of Health   Financial Resource Strain: Low Risk    Difficulty of Paying Living Expenses: Not hard at all  Food Insecurity: No Food Insecurity   Worried About Charity fundraiser in the Last Year: Never true   [place] in the Last Year: Never true  Transportation Needs: No Transportation Needs   Lack of Transportation (Medical): No   Lack of Transportation (Non-Medical): No  Physical Activity: Insufficiently Active   Days of Exercise per Week: 3 days   Minutes of Exercise per Session: 30 min  Stress:  No Stress Concern Present   Feeling of Stress : Only a little  Social Connections: Unknown   Frequency of Communication with Friends and Family: More than three times a week   Frequency of Social Gatherings with Friends and Family: More than three times a week   Attends Religious Services: More than 4 times per year   Active Member of Clubs or Organizations: Yes   Attends Music therapist: More than 4 times per year   Marital Status: Not on file  Intimate Partner Violence: Not At Risk   Fear of Current or Ex-Partner: No   Emotionally Abused: No   Physically Abused: No   Sexually Abused: No    Outpatient Medications Prior to Visit  Medication Sig Dispense Refill   busPIRone (BUSPAR) 7.5 MG tablet TAKE 1 TABLET(7.5 MG) BY MOUTH TWICE DAILY 180 tablet 1   calcium carbonate (OS-CAL) 1250 (500 Ca) MG chewable tablet Chew 1  tablet by mouth daily.     calcium-vitamin D (OSCAL WITH D) 500-200 MG-UNIT tablet Take by mouth daily with breakfast. Take one tablet daily     clonazePAM (KLONOPIN) 0.5 MG tablet Take 1 tablet (0.5 mg total) by mouth 2 (two) times daily as needed for anxiety. 60 tablet 2   Misc Natural Products (FOCUSED MIND PO) Take by mouth. Daily vitamins     naproxen (NAPROSYN) 500 MG tablet TAKE 1 TABLET(500 MG) BY MOUTH TWICE DAILY WITH A MEAL 60 tablet 2   PREMARIN vaginal cream PLACE 1 APPLICATORFUL VAGINALLY 2 TIMES A WEEK 30 g 5   UNABLE TO FIND [employer] Worts     vitamin C (ASCORBIC ACID) 500 MG tablet Take 500 mg by mouth daily.     PFIZER-BIONT COVID-19 VAC-TRIS SUSP injection  (Patient not taking: Reported on 05/17/2021)     No facility-administered medications prior to visit.    No Known Allergies  ROS Review of Systems  Constitutional: Negative.   HENT: Negative.    Eyes:  Negative for photophobia and visual disturbance.  Respiratory: Negative.    Cardiovascular: Negative.   Gastrointestinal: Negative.   Endocrine: Negative for polyphagia and polyuria.  Musculoskeletal:  Positive for arthralgias.  Neurological:  Negative for weakness.  Psychiatric/Behavioral:  Positive for dysphoric mood. The patient is nervous/anxious.   Depression screen [facility] 2/9 05/27/2021 05/27/2021 05/17/2021  Decreased Interest 0 0 0  Down, Depressed, Hopeless 0 0 0  PHQ - 2 Score 0 0 0  Altered sleeping 1 - -  Tired, decreased energy 0 - -  Change in appetite 1 - -  Feeling bad or failure about yourself  0 - -  Trouble concentrating 0 - -  Moving slowly or fidgety/restless 0 - -  Suicidal thoughts 0 - -  PHQ-9 Score 2 - -  Difficult doing work/chores Not difficult at all - -    GAD 7 : Generalized Anxiety Score 05/27/2021  Nervous, Anxious, on Edge 2  Control/stop worrying 1  Worry too much - different things 0  Trouble relaxing 1  Restless 0  Easily annoyed or irritable 1  Afraid - awful might happen 0   Total GAD 7 Score 5  Anxiety Difficulty Not difficult at all       Objective:    Physical Exam Vitals and nursing note reviewed.  Constitutional:      General: She is not in acute distress.    Appearance: Normal appearance. She is not ill-appearing, toxic-appearing or diaphoretic.  HENT:  Head: Normocephalic and atraumatic.     Right Ear: External ear normal.     Left Ear: External ear normal.     Mouth/Throat:     Mouth: Mucous membranes are moist.     Pharynx: Oropharynx is clear. No oropharyngeal exudate or posterior oropharyngeal erythema.  Eyes:     Extraocular Movements: Extraocular movements intact.     Conjunctiva/sclera: Conjunctivae normal.     Pupils: Pupils are equal, round, and reactive to light.  Neck:     Vascular: No carotid bruit.  Cardiovascular:     Rate and Rhythm: Normal rate and regular rhythm.  Pulmonary:     Effort: Pulmonary effort is normal.     Breath sounds: Normal breath sounds.  Musculoskeletal:     Cervical back: No rigidity or tenderness.  Lymphadenopathy:     Cervical: No cervical adenopathy.  Skin:    General: Skin is warm and dry.  Neurological:     Mental Status: She is alert and oriented to person, place, and time.  Psychiatric:        Mood and Affect: Mood normal.    BP 115/70 (BP Location: Right Arm, Patient Position: Sitting, Cuff Size: Normal)   Pulse 76   Temp (!) 97.1 F (36.2 C) (Temporal)   Ht '5\' 6"'$  (1.676 m)   Wt 178 lb 3.2 oz (80.8 kg)   SpO2 97%   BMI 28.76 kg/m  Wt Readings from Last 3 Encounters:  05/27/21 178 lb 3.2 oz (80.8 kg)  10/27/20 174 lb (78.9 kg)  07/23/19 157 lb (71.2 kg)     Health Maintenance Due  Topic Date Due   TETANUS/TDAP  Never done   Zoster Vaccines- Shingrix (1 of 2) Never done   INFLUENZA VACCINE  04/25/2021    There are no preventive care reminders to display for this patient.  Lab Results  Component Value Date   TSH 2.190 04/25/2019   Lab Results  Component Value Date    WBC 7.9 04/25/2019   HGB 14.8 04/25/2019   HCT 45.1 04/25/2019   MCV 90 04/25/2019   PLT 239 04/25/2019   Lab Results  Component Value Date   NA 142 04/25/2019   K 4.5 04/25/2019   CO2 23 04/25/2019   GLUCOSE 89 04/25/2019   BUN 12 04/25/2019   CREATININE 0.70 04/25/2019   CALCIUM 9.5 04/25/2019   Lab Results  Component Value Date   CHOL 190 04/25/2019   Lab Results  Component Value Date   HDL 45 04/25/2019   Lab Results  Component Value Date   LDLCALC 116 (H) 04/25/2019   Lab Results  Component Value Date   TRIG 145 04/25/2019   Lab Results  Component Value Date   CHOLHDL 4.2 04/25/2019   No results found for: HGBA1C    Assessment & Plan:   Problem List Items Addressed This Visit       Digestive   GERD (gastroesophageal reflux disease)   Relevant Medications   pantoprazole (PROTONIX) 40 MG tablet     Musculoskeletal and Integument   Bursitis of right hip - Primary     Other   Anxiety    Meds ordered this encounter  Medications   pantoprazole (PROTONIX) 40 MG tablet    Sig: Take 1 tablet (40 mg total) by mouth daily.    Dispense:  90 tablet    Refill:  1    Follow-up: Return Follow-up in October for scheduled visit.  BuSpar taper.  Use famotidine  as needed..  Will taper BuSpar.  She is open to using other medications.  Will review her Klonopin usage as to the amount.  May or may not choose to continue it.  She is aware of this.  We will start Protonix daily and use famotidine as needed.  Follow-up in October for physical and fasting blood work.  Advised patient to go ahead and have the Shingrix vaccine.  Libby Maw, MD

## 2021-06-01 ENCOUNTER — Ambulatory Visit
Admission: RE | Admit: 2021-06-01 | Discharge: 2021-06-01 | Disposition: A | Discharge status: Home / Self Care | Payer: Medicare HMO | Source: Ambulatory Visit | Attending: Family Medicine | Admitting: Family Medicine

## 2021-06-01 ENCOUNTER — Other Ambulatory Visit: Payer: Self-pay | Admitting: Family

## 2021-06-01 ENCOUNTER — Other Ambulatory Visit: Payer: Self-pay | Admitting: Internal Medicine

## 2021-06-01 ENCOUNTER — Other Ambulatory Visit: Payer: Self-pay

## 2021-06-01 DIAGNOSIS — Z1231 Encounter for screening mammogram for malignant neoplasm of breast: Secondary | ICD-10-CM

## 2021-06-02 DIAGNOSIS — M7061 Trochanteric bursitis, right hip: Secondary | ICD-10-CM | POA: Diagnosis not present

## 2021-06-06 DIAGNOSIS — M7061 Trochanteric bursitis, right hip: Secondary | ICD-10-CM | POA: Diagnosis not present

## 2021-06-14 ENCOUNTER — Telehealth (INDEPENDENT_AMBULATORY_CARE_PROVIDER_SITE_OTHER): Payer: Medicare HMO | Admitting: Family Medicine

## 2021-06-14 DIAGNOSIS — U071 COVID-19: Secondary | ICD-10-CM

## 2021-06-14 MED ORDER — BENZONATATE 200 MG PO CAPS: 200.0000 mg | ORAL_CAPSULE | Freq: Two times a day (BID) | ORAL | 0 refills | Status: DC | PRN

## 2021-06-14 NOTE — Patient Instructions (Addendum)
  HOME CARE TIPS:  -I sent the medication(s) we discussed to your pharmacy: Meds ordered this encounter  Medications   benzonatate (TESSALON) 200 MG capsule    Sig: Take 1 capsule (200 mg total) by mouth 2 (two) times daily as needed for cough.    Dispense:  20 capsule    Refill:  0    -can use tylenol if needed for fevers, aches and pains per instructions  -can use nasal saline a few times per day if you have nasal congestion; sometimes  a short course of Afrin nasal spray for 3 days can help with symptoms as well  -stay hydrated, drink plenty of fluids and eat small healthy meals - avoid dairy  -can take 1000 IU (79mcg) Vit D3 and 100-500 mg of Vit C daily per instructions  -follow up with your doctor in 2-3 days unless improving and feeling better  -stay home while sick, except to seek medical care. If you have COVID19, ideally it would be best to stay home for a full 10 days since the onset of symptoms PLUS one day of no fever and feeling better. Wear a good mask that fits snugly (such as N95 or KN95) if around others to reduce the risk of transmission.  It was nice to meet you today, and I really hope you are feeling better soon. I help [facility] out with telemedicine visits on Tuesdays and Thursdays and am available for visits on those days. If you have any concerns or questions following this visit please schedule a follow up visit with your Primary Care doctor or seek care at a local urgent care clinic to avoid delays in care.    Seek in person care or schedule a follow up video visit promptly if your symptoms worsen, new concerns arise or you are not improving with treatment. Call 911 and/or seek emergency care if your symptoms are severe or life threatening.

## 2021-06-14 NOTE — Progress Notes (Signed)
Virtual Visit via Video Note  I connected with Lisa Spence  on 06/14/21 at 10:00 AM EDT by a video enabled telemedicine application and verified that I am speaking with the correct person using two identifiers.  Location patient: home, [place] Location provider:work or home office Persons participating in the virtual visit: patient, provider  I discussed the limitations of evaluation and management by telemedicine and the availability of in person appointments. The patient expressed understanding and agreed to proceed.   HPI:  Acute telemedicine visit for Covid19: -Onset: exposed a week ago; onset of symptoms 5 days ago, tested positive 2 days ago -Symptoms include: had flu like symptoms with fever initially, nasal congestion, headache and cough -most symptoms resolved today but still has some nasal congestion and sinus congestion -Denies: CP, SOB, NVD, inability to eat/drink/get out of bed -Has tried: tylenol -Pertinent past medical history: see below -Pertinent medication allergies: No Known Allergies -COVID-19 vaccine status: vaccinated x2 and had a booster  ROS: See pertinent positives and negatives per HPI.  Past Medical History:  Diagnosis Date   Allergy    Anxiety    GERD (gastroesophageal reflux disease)     Past Surgical History:  Procedure Laterality Date   ABDOMINAL HYSTERECTOMY     COLONOSCOPY     ORIF ANKLE FRACTURE Left 07/09/2017   Procedure: OPEN REDUCTION INTERNAL FIXATION (ORIF) ANKLE FRACTURE;  Surgeon: Nicholes Stairs, MD;  Location: [facility];  Service: Orthopedics;  Laterality: Left;  90 mins     Current Outpatient Medications:    benzonatate (TESSALON) 200 MG capsule, Take 1 capsule (200 mg total) by mouth 2 (two) times daily as needed for cough., Disp: 20 capsule, Rfl: 0   busPIRone (BUSPAR) 7.5 MG tablet, TAKE 1 TABLET(7.5 MG) BY MOUTH TWICE DAILY, Disp: 180 tablet, Rfl: 1   calcium carbonate (OS-CAL) 1250 (500 Ca) MG chewable tablet, Chew 1 tablet by  mouth daily., Disp: , Rfl:    calcium-vitamin D (OSCAL WITH D) 500-200 MG-UNIT tablet, Take by mouth daily with breakfast. Take one tablet daily, Disp: , Rfl:    clonazePAM (KLONOPIN) 0.5 MG tablet, Take 1 tablet (0.5 mg total) by mouth 2 (two) times daily as needed for anxiety., Disp: 60 tablet, Rfl: 2   Misc Natural Products (FOCUSED MIND PO), Take by mouth. Daily vitamins, Disp: , Rfl:    naproxen (NAPROSYN) 500 MG tablet, TAKE 1 TABLET(500 MG) BY MOUTH TWICE DAILY WITH A MEAL, Disp: 60 tablet, Rfl: 2   pantoprazole (PROTONIX) 40 MG tablet, Take 1 tablet (40 mg total) by mouth daily., Disp: 90 tablet, Rfl: 1   PREMARIN vaginal cream, PLACE 1 APPLICATORFUL VAGINALLY 2 TIMES A WEEK, Disp: 30 g, Rfl: 5   UNABLE TO FIND, [employer] Worts, Disp: , Rfl:    vitamin C (ASCORBIC ACID) 500 MG tablet, Take 500 mg by mouth daily., Disp: , Rfl:   EXAM:  VITALS per patient if applicable:  GENERAL: alert, oriented, appears well and in no acute distress  HEENT: atraumatic, conjunttiva clear, no obvious abnormalities on inspection of external nose and ears  NECK: normal movements of the head and neck  LUNGS: on inspection no signs of respiratory distress, breathing rate appears normal, no obvious gross SOB, gasping or wheezing  CV: no obvious cyanosis  MS: moves all visible extremities without noticeable abnormality  PSYCH/NEURO: pleasant and cooperative, no obvious depression or anxiety, speech and thought processing grossly intact  ASSESSMENT AND PLAN:  Discussed the following assessment and plan:  COVID-19  Discussed treatment options (infusions and oral options and risk of drug interactions), ideal treatment window, potential complications, isolation and precautions for COVID-19.   After lengthy discussion, the patient declined referral for Covid outpatient treatment at this time. The patient did want a prescription for cough, Tessalon Rx sent.  Other symptomatic care measures summarized in  patient instructions. Work/School slipped offered: declined Advised to seek prompt in person care if worsening, new symptoms arise, or if is not improving with treatment. Discussed options for inperson care if PCP office not available. Did let this patient know that I only do telemedicine on Tuesdays and Thursdays for [facility]. Advised to schedule follow up visit with PCP or [facility] if any further questions or concerns to avoid delays in care.   I discussed the assessment and treatment plan with the patient. The patient was provided an opportunity to ask questions and all were answered. The patient agreed with the plan and demonstrated an understanding of the instructions.     Lucretia Kern, DO

## 2021-06-16 ENCOUNTER — Ambulatory Visit: Payer: Medicare HMO | Admitting: Podiatry

## 2021-07-06 ENCOUNTER — Encounter: Payer: Medicare HMO | Admitting: Family Medicine

## 2021-08-01 ENCOUNTER — Ambulatory Visit: Payer: Medicare HMO | Admitting: Podiatry

## 2021-08-01 ENCOUNTER — Other Ambulatory Visit: Payer: Self-pay

## 2021-08-01 DIAGNOSIS — L84 Corns and callosities: Secondary | ICD-10-CM

## 2021-08-01 DIAGNOSIS — M79674 Pain in right toe(s): Secondary | ICD-10-CM

## 2021-08-01 DIAGNOSIS — Q6672 Congenital pes cavus, left foot: Secondary | ICD-10-CM

## 2021-08-01 DIAGNOSIS — M79675 Pain in left toe(s): Secondary | ICD-10-CM | POA: Diagnosis not present

## 2021-08-01 DIAGNOSIS — M216X9 Other acquired deformities of unspecified foot: Secondary | ICD-10-CM

## 2021-08-01 DIAGNOSIS — B351 Tinea unguium: Secondary | ICD-10-CM

## 2021-08-01 MED ORDER — EFINACONAZOLE 10 % EX SOLN: 1.0000 [drp] | Freq: Every day | CUTANEOUS | 11 refills | Status: DC

## 2021-08-01 NOTE — Progress Notes (Signed)
Subjective: 70 year old female presents the office today for concerns of nails becoming thick and elongated causing discomfort as well as for calluses.  She the nails and calluses trimmed due to the discomfort.  She was using topical antifungal in the nails but she stopped using it due to the package instructions that she can only use it for certain amount of time.  She has purchased new shoes which have been helpful. She is asking about if the plates/screws need to come out. They are not causing any pain.   Objective: AAO x3, NAD DP/PT pulses palpable bilaterally, CRT less than 3 seconds Noted to be hypertrophic, dystrophic, yellow discoloration x10.  The nails do cause discomfort nails 1-5 bilaterally.  There is incurvation of multiple of the lesser digit nails with any edema, erythema or signs of infection.  Hyperkeratotic tissues medial first MPJ as well as submetatarsal 4 bilaterally. No underlying ulceration drainage or any signs of infection.  Cavus foot type is present.  Hardware is palpable at the lateral ankle on the left side from previous ankle fracture ORIF.  She has some numbness into her toes since the injury. Prominent metatarsal heads.  No pain with calf compression, swelling, warmth, erythema  Assessment: Symptomatic onychomycosis, hyperkeratotic lesions; prominent hardware  Plan: -All treatment options discussed with the patient including all alternatives, risks, complications.  -Sharp debrided the nails x10 without any complications or bleeding.  Prescribe Jublia discussed side effects. -Sharply debrided hyperkeratotic lesions x4 without any complications or bleeding.  Recommend moisturizer on a regular basis. -Add metatarsal pads to her shoes -Discussed that if the hardware is causing issues to remove it otherwise it is not causing any issues and has not changed would recommend leaving it in place for now.  -Patient encouraged to call the office with any questions, concerns,  change in symptoms.   Trula Slade DPM

## 2021-08-01 NOTE — Patient Instructions (Signed)
Efinaconazole Topical Solution °What is this medication? °EFINACONAZOLE (e FEE na KON a zole) is an antifungal medicine. It is used to treat certain kinds of fungal infections of the toenail. °This medicine may be used for other purposes; ask your health care provider or pharmacist if you have questions. °COMMON BRAND NAME(S): JUBLIA °What should I tell my care team before I take this medication? °They need to know if you have any of these conditions: °an unusual or allergic reaction to efinaconazole, other medicines, foods, dyes or preservatives °pregnant or trying to get pregnant °breast-feeding °How should I use this medication? °This medicine is for external use only. Do not take by mouth. Follow the directions on the label. Wash hands before and after use. Apply this medicine using the provided brush to cover the entire toenail. Do not use your medicine more often than directed. Finish the full course prescribed by your doctor or health care professional even if you think your condition is better. Do not stop using except on the advice of your doctor or health care professional. °Talk to your pediatrician regarding the use of this medicine in children. While this drug may be prescribed for children as young as 6 years for selected conditions, precautions do apply. °Overdosage: If you think you have taken too much of this medicine contact a poison control center or emergency room at once. °NOTE: This medicine is only for you. Do not share this medicine with others. °What if I miss a dose? °If you miss a dose, use it as soon as you can. If it is almost time for your next dose, use only that dose. Do not use double or extra doses. °What may interact with this medication? °Interactions have not been studied. Do not use any other nail products (i.e., nail polish, pedicures) during treatment with this medicine. °This list may not describe all possible interactions. Give your health care provider a list of all the  medicines, herbs, non-prescription drugs, or dietary supplements you use. Also tell them if you smoke, drink alcohol, or use illegal drugs. Some items may interact with your medicine. °What should I watch for while using this medication? °Do not get this medicine in your eyes. If you do, rinse out with plenty of cool tap water. °Tell your doctor or health care professional if your symptoms do not start to get better or if they get worse. °Wait for at least 10 minutes after bathing before applying this medication. After bathing, make sure that your feet are very dry. Fungal infections like moist conditions. Do not walk around barefoot. To help prevent reinfection, wear freshly washed cotton, not synthetic clothing. Tell your doctor or health care professional if you develop sores or blisters that do not heal properly. If your nail infection returns after you stop using this medicine, contact your doctor or health care professional. °What side effects may I notice from receiving this medication? °Side effects that you should report to your doctor or health care professional as soon as possible: °allergic reactions like skin rash, itching or hives, swelling of the face, lips, or tongue °ingrown toenail °Side effects that usually do not require medical attention (report to your doctor or health care professional if they continue or are bothersome): °mild skin irritation, burning, or itching °This list may not describe all possible side effects. Call your doctor for medical advice about side effects. You may report side effects to FDA at 1-800-FDA-1088. °Where should I keep my medication? °Keep out of the   reach of children. °Store at room temperature between 20 and 25 degrees C (68 and 77 degrees F). Keep this medicine in the original container. Throw away any unused medicine after the expiration date. °This medicine is flammable. Avoid exposure to heat, fire, flame, and smoking. °NOTE: This sheet is a summary. It may  not cover all possible information. If you have questions about this medicine, talk to your doctor, pharmacist, or health care provider. °© 2022 [employer]vier/Gold Standard (2019-01-22 00:00:00) ° °

## 2021-08-03 ENCOUNTER — Other Ambulatory Visit: Payer: Self-pay

## 2021-08-03 DIAGNOSIS — F419 Anxiety disorder, unspecified: Secondary | ICD-10-CM

## 2021-08-03 MED ORDER — CLONAZEPAM 0.5 MG PO TABS: 0.5000 mg | ORAL_TABLET | Freq: Two times a day (BID) | ORAL | 0 refills | Status: DC | PRN

## 2021-08-03 NOTE — Telephone Encounter (Signed)
Refill request for: Clonazepam 0.5 mg  LR 10/27/20, #60, 2 rf LOV 12/17/20 FOV  09/01/21 w/ Jodi Mourning  Please review and advise.  Thanks. Dm/cma

## 2021-09-01 ENCOUNTER — Encounter: Payer: Self-pay | Admitting: Family

## 2021-09-01 ENCOUNTER — Ambulatory Visit (INDEPENDENT_AMBULATORY_CARE_PROVIDER_SITE_OTHER): Payer: Medicare HMO | Admitting: Family

## 2021-09-01 ENCOUNTER — Telehealth: Payer: Self-pay | Admitting: Family

## 2021-09-01 VITALS — BP 136/82 | HR 62 | Temp 98.1°F | Resp 18 | Ht 66.0 in | Wt 176.4 lb

## 2021-09-01 DIAGNOSIS — E559 Vitamin D deficiency, unspecified: Secondary | ICD-10-CM | POA: Diagnosis not present

## 2021-09-01 DIAGNOSIS — R69 Illness, unspecified: Secondary | ICD-10-CM | POA: Diagnosis not present

## 2021-09-01 DIAGNOSIS — M810 Age-related osteoporosis without current pathological fracture: Secondary | ICD-10-CM

## 2021-09-01 DIAGNOSIS — Z13220 Encounter for screening for lipoid disorders: Secondary | ICD-10-CM

## 2021-09-01 DIAGNOSIS — F419 Anxiety disorder, unspecified: Secondary | ICD-10-CM | POA: Diagnosis not present

## 2021-09-01 DIAGNOSIS — R635 Abnormal weight gain: Secondary | ICD-10-CM | POA: Diagnosis not present

## 2021-09-01 DIAGNOSIS — Z23 Encounter for immunization: Secondary | ICD-10-CM

## 2021-09-01 MED ORDER — CLONAZEPAM 0.5 MG PO TABS: 0.5000 mg | ORAL_TABLET | Freq: Two times a day (BID) | ORAL | 0 refills | Status: DC | PRN

## 2021-09-01 NOTE — Telephone Encounter (Signed)
Pt. Called and stated: -[place] she suggested has retired -Cannot have another Bone Density until 6/23. She wants to know what to do moving forward regarding this.

## 2021-09-01 NOTE — Progress Notes (Signed)
Lisa Spence is a 70 y.o. female with the following history as recorded in EpicCare:  Patient Active Problem List   Diagnosis Date Noted   Bursitis of right hip 05/27/2021   Osteoporosis 03/10/2020   Hot flashes due to menopause 05/02/2019   Anxiety 04/25/2019   GERD (gastroesophageal reflux disease) 04/25/2019   Constipation 04/25/2019   Vaginal dryness, menopausal 04/25/2019   Healthcare maintenance 04/25/2019   Left trimalleolar fracture, closed, initial encounter 07/09/2017    Current Outpatient Medications  Medication Sig Dispense Refill   calcium carbonate (OS-CAL) 1250 (500 Ca) MG chewable tablet Chew 1 tablet by mouth daily.     calcium-vitamin D (OSCAL WITH D) 500-200 MG-UNIT tablet Take by mouth daily with breakfast. Take one tablet daily     Efinaconazole 10 % SOLN Apply topically.     Misc Natural Products (FOCUSED MIND PO) Take by mouth. Daily vitamins     naproxen (NAPROSYN) 500 MG tablet TAKE 1 TABLET(500 MG) BY MOUTH TWICE DAILY WITH A MEAL 60 tablet 2   pantoprazole (PROTONIX) 40 MG tablet Take 1 tablet (40 mg total) by mouth daily. 90 tablet 1   PREMARIN vaginal cream PLACE 1 APPLICATORFUL VAGINALLY 2 TIMES A WEEK 30 g 5   vitamin C (ASCORBIC ACID) 500 MG tablet Take 500 mg by mouth daily.     clonazePAM (KLONOPIN) 0.5 MG tablet Take 1 tablet (0.5 mg total) by mouth 2 (two) times daily as needed for anxiety. 60 tablet 0   No current facility-administered medications for this visit.    Allergies: Patient has no known allergies.  Past Medical History:  Diagnosis Date   Allergy    Anxiety    GERD (gastroesophageal reflux disease)     Past Surgical History:  Procedure Laterality Date   ABDOMINAL HYSTERECTOMY     COLONOSCOPY     ORIF ANKLE FRACTURE Left 07/09/2017   Procedure: OPEN REDUCTION INTERNAL FIXATION (ORIF) ANKLE FRACTURE;  Surgeon: Nicholes Stairs, MD;  Location: [place];  Service: Orthopedics;  Laterality: Left;  90 mins    Family History   Problem Relation Age of Onset   Heart disease Mother    Heart disease Father    Colon cancer Neg Hx    Esophageal cancer Neg Hx    Rectal cancer Neg Hx    Stomach cancer Neg Hx     Social History   Tobacco Use   Smoking status: Never   Smokeless tobacco: Never  Substance Use Topics   Alcohol use: No    Comment: no longer drinks, drank 6 glasses wine/week stopped in 2007    Subjective:  Presents to [facility]- her provider from previous office has left the office;   DEXA in 2021- diagnosed with osteoporosis; was prescribed Prolia but patient has never started; would be open to getting update DEXA;   Anxiety- would like to see therapist; has taken Klonopin prn;   Vaginal dryness- s/p hysterectomy in 1996; uses Premarin vaginal cream with relief; needs to take 2x per week;   Overdue for fasting labs- will return at later date;      Objective:  Vitals:   09/01/21 0911  BP: 136/82  Pulse: 62  Resp: 18  Temp: 98.1 F (36.7 C)  TempSrc: Oral  SpO2: 97%  Weight: 176 lb 6.4 oz (80 kg)  Height: _0  (1.676 m)    General: Well developed, well nourished, in no acute distress  Skin : Warm and dry.  Head: Normocephalic and atraumatic  Eyes: Sclera and conjunctiva clear; pupils round and reactive to light; extraocular movements intact  Lungs: Respirations unlabored; clear to auscultation bilaterally without wheeze, rales, rhonchi  CVS exam: normal rate and regular rhythm.  Neurologic: Alert and oriented; speech intact; face symmetrical; moves all extremities well; CNII-XII intact without focal deficit   Assessment:  1. Weight gain   2. Osteoporosis, unspecified osteoporosis type, unspecified pathological fracture presence   3. Anxiety   4. Lipid screening   5. Vitamin D deficiency   6. Need for influenza vaccination     Plan:   Return for fasting labs; Update DEXA; Refer to therapist; Flu shot updated;   This visit occurred during the SARS-CoV-2 public health  emergency.  Safety protocols were in place, including screening questions prior to the visit, additional usage of staff PPE, and extensive cleaning of exam room while observing appropriate contact time as indicated for disinfecting solutions.       Return for Return for fasting labs at patient's convenience.  Orders Placed This Encounter  Procedures   DG Bone Density    Standing Status:   Future    Standing Expiration Date:   09/01/2022    Order Specific Question:   Reason for Exam (SYMPTOM  OR DIAGNOSIS REQUIRED)    Answer:   osteoporosis    Order Specific Question:   Preferred imaging location?    Answer:   [facility]   Flu Vaccine QUAD High Dose(Fluad)   CBC with Differential/Platelet    Standing Status:   Future    Standing Expiration Date:   09/01/2022   Comp Met (CMET)    Standing Status:   Future    Standing Expiration Date:   09/01/2022   Lipid panel    Standing Status:   Future    Standing Expiration Date:   09/01/2022   TSH    Standing Status:   Future    Standing Expiration Date:   09/01/2022   Vitamin D (25 hydroxy)    Standing Status:   Future    Standing Expiration Date:   09/01/2022   Ambulatory referral to Psychology    Referral Priority:   Routine    Referral Type:   Psychiatric    Referral Reason:   Specialty Services Required    Requested Specialty:   Psychology    Number of Visits Requested:   1    Requested Prescriptions   Signed Prescriptions Disp Refills   clonazePAM (KLONOPIN) 0.5 MG tablet 60 tablet 0    Sig: Take 1 tablet (0.5 mg total) by mouth 2 (two) times daily as needed for anxiety.

## 2021-09-02 ENCOUNTER — Other Ambulatory Visit (INDEPENDENT_AMBULATORY_CARE_PROVIDER_SITE_OTHER): Payer: Medicare HMO

## 2021-09-02 ENCOUNTER — Other Ambulatory Visit: Payer: Self-pay | Admitting: Family

## 2021-09-02 DIAGNOSIS — R635 Abnormal weight gain: Secondary | ICD-10-CM | POA: Diagnosis not present

## 2021-09-02 DIAGNOSIS — Z13220 Encounter for screening for lipoid disorders: Secondary | ICD-10-CM

## 2021-09-02 DIAGNOSIS — E559 Vitamin D deficiency, unspecified: Secondary | ICD-10-CM

## 2021-09-02 DIAGNOSIS — R7989 Other specified abnormal findings of blood chemistry: Secondary | ICD-10-CM

## 2021-09-02 LAB — COMPREHENSIVE METABOLIC PANEL
ALT: 53 U/L — ABNORMAL HIGH (ref 0–35)
AST: 28 U/L (ref 0–37)
Albumin: 4.5 g/dL (ref 3.5–5.2)
Alkaline Phosphatase: 87 U/L (ref 39–117)
BUN: 17 mg/dL (ref 6–23)
CO2: 28 mEq/L (ref 19–32)
Calcium: 9.4 mg/dL (ref 8.4–10.5)
Chloride: 101 mEq/L (ref 96–112)
Creatinine, Ser: 0.7 mg/dL (ref 0.40–1.20)
GFR: 88.68 mL/min (ref >60.00)
Glucose, Bld: 93 mg/dL (ref 70–99)
Potassium: 4.3 mEq/L (ref 3.5–5.1)
Sodium: 138 mEq/L (ref 135–145)
Total Bilirubin: 0.5 mg/dL (ref 0.2–1.2)
Total Protein: 6.7 g/dL (ref 6.0–8.3)

## 2021-09-02 LAB — CBC WITH DIFFERENTIAL/PLATELET
Basophils Absolute: 0.1 10*3/uL (ref 0.0–0.1)
Basophils Relative: 0.8 % (ref 0.0–3.0)
Eosinophils Absolute: 0 10*3/uL (ref 0.0–0.7)
Eosinophils Relative: 0.6 % (ref 0.0–5.0)
HCT: 43.8 % (ref 36.0–46.0)
Hemoglobin: 14.5 g/dL (ref 12.0–15.0)
Lymphocytes Relative: 11.8 % — ABNORMAL LOW (ref 12.0–46.0)
Lymphs Abs: 0.9 10*3/uL (ref 0.7–4.0)
MCHC: 33 g/dL (ref 30.0–36.0)
MCV: 89.1 fl (ref 78.0–100.0)
Monocytes Absolute: 0.5 10*3/uL (ref 0.1–1.0)
Monocytes Relative: 6 % (ref 3.0–12.0)
Neutro Abs: 6.1 10*3/uL (ref 1.4–7.7)
Neutrophils Relative %: 80.8 % — ABNORMAL HIGH (ref 43.0–77.0)
Platelets: 206 10*3/uL (ref 150.0–400.0)
RBC: 4.92 Mil/uL (ref 3.87–5.11)
RDW: 13.2 % (ref 11.5–15.5)
WBC: 7.5 10*3/uL (ref 4.0–10.5)

## 2021-09-02 LAB — LIPID PANEL
Cholesterol: 186 mg/dL (ref 0–200)
HDL: 48.9 mg/dL (ref >39.00)
LDL Cholesterol: 121 mg/dL — ABNORMAL HIGH (ref 0–99)
NonHDL: 136.99
Total CHOL/HDL Ratio: 4
Triglycerides: 78 mg/dL (ref 0.0–149.0)
VLDL: 15.6 mg/dL (ref 0.0–40.0)

## 2021-09-02 LAB — VITAMIN D 25 HYDROXY (VIT D DEFICIENCY, FRACTURES): VITD: 41.2 ng/mL (ref 30.00–100.00)

## 2021-09-02 LAB — TSH: TSH: 1.84 u[IU]/mL (ref 0.35–5.50)

## 2021-09-02 NOTE — Telephone Encounter (Signed)
Pt. Returned call. Pt stated you can lvm with information

## 2021-09-02 NOTE — Telephone Encounter (Signed)
I have called pt to relay the message to the pt and there was no answer. I did leave a message to call back.

## 2021-09-02 NOTE — Telephone Encounter (Signed)
I have called pt and relayed the message. She has an appointment set up for 1 m and 6 month.   She do want to wait for the prolia until after her next dexa scan.   Please place future orders for the 1 m lab f/u appt.

## 2021-09-12 ENCOUNTER — Telehealth: Payer: Self-pay | Admitting: Family

## 2021-09-12 NOTE — Telephone Encounter (Signed)
Patient states she is moving to [place] on [address] since it is much closer to her and she needs her lab orders sent over there so she can get them done there. Please advice.

## 2021-09-13 NOTE — Telephone Encounter (Signed)
I have called pt back and informed her that she did get her labs drawn here on 09/02/21 and she has gotten the results from it. We did recommend that she gets her Hepatic functions recheck. She has canceled the Lab visit and not wants everything done at [facility]. I stated understanding and informed her that if she wanted that repeat lab draw then she will have to get one of the providers there to order the lab. She stated understanding and will give them a call. She did want to make sure that they will be able to see the lab results. I have informed her that since they are still apart of the [place] system that they will be able to see the lab results.

## 2021-09-13 NOTE — Telephone Encounter (Signed)
I have called the pt to inform her that we typically can not order labs to be drawn in another office. She has 2 options. The first one would be to come to [place] to get the labs drawn or she will have to contact a provider at the new office to get them to order the labs to get them drawn there. There was no answer so I left a VM for pt.

## 2021-09-13 NOTE — Telephone Encounter (Signed)
After hours call: Pt returning Mei's call.   Telephone: [phone]

## 2021-09-13 NOTE — Telephone Encounter (Signed)
pt return phone call regarding labs. pt has a work meeting scheduled for 10 this moring and would like a telephone call back early afternoon. please advise.

## 2021-10-03 ENCOUNTER — Other Ambulatory Visit: Payer: Medicare HMO

## 2021-10-04 ENCOUNTER — Other Ambulatory Visit (INDEPENDENT_AMBULATORY_CARE_PROVIDER_SITE_OTHER): Payer: Medicare HMO

## 2021-10-04 ENCOUNTER — Other Ambulatory Visit: Payer: Self-pay

## 2021-10-04 DIAGNOSIS — R7989 Other specified abnormal findings of blood chemistry: Secondary | ICD-10-CM | POA: Diagnosis not present

## 2021-10-04 LAB — HEPATIC FUNCTION PANEL
ALT: 44 U/L — ABNORMAL HIGH (ref 0–35)
AST: 33 U/L (ref 0–37)
Albumin: 4.6 g/dL (ref 3.5–5.2)
Alkaline Phosphatase: 92 U/L (ref 39–117)
Bilirubin, Direct: 0.1 mg/dL (ref 0.0–0.3)
Total Bilirubin: 0.5 mg/dL (ref 0.2–1.2)
Total Protein: 6.8 g/dL (ref 6.0–8.3)

## 2021-10-04 NOTE — Progress Notes (Signed)
Per Marrian Salvage, FNP pt is here for labs, pt tolerated draw well.

## 2021-10-06 DIAGNOSIS — L821 Other seborrheic keratosis: Secondary | ICD-10-CM | POA: Diagnosis not present

## 2021-10-06 DIAGNOSIS — L723 Sebaceous cyst: Secondary | ICD-10-CM | POA: Diagnosis not present

## 2021-10-06 DIAGNOSIS — L304 Erythema intertrigo: Secondary | ICD-10-CM | POA: Diagnosis not present

## 2021-10-11 ENCOUNTER — Telehealth: Payer: Self-pay | Admitting: Nurse Practitioner

## 2021-10-11 NOTE — Telephone Encounter (Signed)
Pt called [facility] this morning and spoke with [place]. She was requesting to speak with Wilfred Lacy, NP regarding recent lab results. Pt was advised that she would need to speak to ordering provider and that clarification would be asked of supervisor.   I called and left a voicemail for pt confirming that Baldo Ash did not order the labs and would not be able to discuss the results with her at this time. Pt is scheduled for TOC to [facility] 11/30/2021. At that time Baldo Ash may review labs with pt if needed.

## 2021-11-01 ENCOUNTER — Ambulatory Visit: Payer: Medicare HMO | Admitting: Podiatry

## 2021-11-07 ENCOUNTER — Other Ambulatory Visit: Payer: Self-pay

## 2021-11-07 ENCOUNTER — Ambulatory Visit: Payer: Medicare HMO | Admitting: Podiatry

## 2021-11-07 DIAGNOSIS — M79675 Pain in left toe(s): Secondary | ICD-10-CM | POA: Diagnosis not present

## 2021-11-07 DIAGNOSIS — M79674 Pain in right toe(s): Secondary | ICD-10-CM

## 2021-11-07 DIAGNOSIS — B351 Tinea unguium: Secondary | ICD-10-CM

## 2021-11-07 NOTE — Progress Notes (Signed)
Established Patient Office Visit  Subjective:  Patient ID: Lisa Spence, female    DOB: [birth year]  Age: 71 y.o. MRN: [id number]  CC:  Chief Complaint  Patient presents with   Transitions Of Care    Toc. Est care. Pt requesting ear cleaning and discuss treatment for GERD.     HPI Lisa Spence presents to transfer care to a new provider. Introduced to Designer, jewellery role and practice setting.  All questions answered.  Discussed provider/patient relationship and expectations.  GERD  Ongoing episodes of reflux multiple days and then no symptoms for several days. She has been taking protonix prn which helps with her symptoms, but then they return a few days later.   EAR CLOGGED  Duration: days Involved ear(s):  bilateral Sensation of feeling clogged/plugged: yes Decreased/muffled hearing:yes Ear pain: yes - right ear Fever: no Otorrhea: no Hearing loss: no Upper respiratory infection symptoms: no Using Q-Tips: no Status: worse History of cerumenosis: yes Treatments attempted: none  ANXIETY   She has a history of anxiety and has been taking klonpin prn anxiety. She states that she only takes about 2 pills per week. She had been tried on bupropion, buspar, celexa, and sertraline in the past but didn't get any relief from this. She is willing to try another medication to help keep her calm and "take the edge off." She denies SI/HI.  Depression screen [facility] 2/9 11/08/2021 05/27/2021 05/27/2021 05/17/2021 10/27/2020  Decreased Interest 0 0 0 0 0  Down, Depressed, Hopeless 0 0 0 0 0  PHQ - 2 Score 0 0 0 0 0  Altered sleeping 1 1 - - -  Tired, decreased energy 1 0 - - -  Change in appetite 1 1 - - -  Feeling bad or failure about yourself  0 0 - - -  Trouble concentrating 1 0 - - -  Moving slowly or fidgety/restless 1 0 - - -  Suicidal thoughts 0 0 - - -  PHQ-9 Score 5 2 - - -  Difficult doing work/chores Somewhat difficult Not difficult at all - - -   GAD 7 : Generalized  Anxiety Score 11/08/2021 05/27/2021  Nervous, Anxious, on Edge 2 2  Control/stop worrying 1 1  Worry too much - different things 0 0  Trouble relaxing 2 1  Restless 0 0  Easily annoyed or irritable 1 1  Afraid - awful might happen 0 0  Total GAD 7 Score 6 5  Anxiety Difficulty Somewhat difficult Not difficult at all    Past Medical History:  Diagnosis Date   Allergy    Anxiety    GERD (gastroesophageal reflux disease)    Osteoporosis     Past Surgical History:  Procedure Laterality Date   ABDOMINAL HYSTERECTOMY     COLONOSCOPY     ORIF ANKLE FRACTURE Left 07/09/2017   Procedure: OPEN REDUCTION INTERNAL FIXATION (ORIF) ANKLE FRACTURE;  Surgeon: Nicholes Stairs, MD;  Location: [place];  Service: Orthopedics;  Laterality: Left;  90 mins    Family History  Problem Relation Age of Onset   Heart disease Mother    Heart disease Father    Heart disease Maternal Grandmother    Heart disease Maternal Grandfather    Heart disease Paternal Grandmother    Heart disease Paternal Grandfather    Colon cancer Neg Hx    Esophageal cancer Neg Hx    Rectal cancer Neg Hx    Stomach cancer Neg Hx  Social History   Socioeconomic History   Marital status: Single    Spouse name: Not on file   Number of children: Not on file   Years of education: Not on file   Highest education level: Not on file  Occupational History   Not on file  Tobacco Use   Smoking status: Never   Smokeless tobacco: Never  Vaping Use   Vaping Use: Never used  Substance and Sexual Activity   Alcohol use: No    Comment: no longer drinks, drank 6 glasses wine/week stopped in 2007   Drug use: No   Sexual activity: Not Currently  Other Topics Concern   Not on file  Social History Narrative   Not on file   Social Determinants of Health   Financial Resource Strain: Low Risk    Difficulty of Paying Living Expenses: Not hard at all  Food Insecurity: No Food Insecurity   Worried About Charity fundraiser  in the Last Year: Never true   [place] in the Last Year: Never true  Transportation Needs: No Transportation Needs   Lack of Transportation (Medical): No   Lack of Transportation (Non-Medical): No  Physical Activity: Insufficiently Active   Days of Exercise per Week: 3 days   Minutes of Exercise per Session: 30 min  Stress: No Stress Concern Present   Feeling of Stress : Only a little  Social Connections: Unknown   Frequency of Communication with Friends and Family: More than three times a week   Frequency of Social Gatherings with Friends and Family: More than three times a week   Attends Religious Services: More than 4 times per year   Active Member of [employer] or Organizations: Yes   Attends Music therapist: More than 4 times per year   Marital Status: Not on file  Intimate Partner Violence: Not At Risk   Fear of Current or Ex-Partner: No   Emotionally Abused: No   Physically Abused: No   Sexually Abused: No    Outpatient Medications Prior to Visit  Medication Sig Dispense Refill   cholecalciferol (VITAMIN D3) 25 MCG (1000 UNIT) tablet Take 1,000 Units by mouth daily.     polyethylene glycol (MIRALAX / GLYCOLAX) 17 g packet Take 17 g by mouth daily.     calcium carbonate (OS-CAL) 1250 (500 Ca) MG chewable tablet Chew 1 tablet by mouth daily.     clonazePAM (KLONOPIN) 0.5 MG tablet Take 1 tablet (0.5 mg total) by mouth 2 (two) times daily as needed for anxiety. 60 tablet 0   Efinaconazole 10 % SOLN Apply topically.     ketoconazole (NIZORAL) 2 % cream Apply topically daily.     Misc Natural Products (FOCUSED MIND PO) Take by mouth. Daily vitamins     pantoprazole (PROTONIX) 40 MG tablet Take 1 tablet (40 mg total) by mouth daily. (Patient taking differently: Take 40 mg by mouth daily as needed.) 90 tablet 1   PREMARIN vaginal cream PLACE 1 APPLICATORFUL VAGINALLY 2 TIMES A WEEK 30 g 5   vitamin C (ASCORBIC ACID) 500 MG tablet Take 500 mg by mouth daily.      calcium-vitamin D (OSCAL WITH D) 500-200 MG-UNIT tablet Take by mouth daily with breakfast. Take one tablet daily     naproxen (NAPROSYN) 500 MG tablet TAKE 1 TABLET(500 MG) BY MOUTH TWICE DAILY WITH A MEAL 60 tablet 2   No facility-administered medications prior to visit.    No Known Allergies  ROS Review of Systems  Constitutional: Negative.   HENT: Negative.    Respiratory: Negative.    Cardiovascular: Negative.   Gastrointestinal:  Positive for constipation.       Acid reflux  Genitourinary: Negative.   Musculoskeletal:  Positive for arthralgias (right hip pain, chronic, stable).  Neurological: Negative.   Psychiatric/Behavioral:  Negative for sleep disturbance. The patient is nervous/anxious.      Objective:    Physical Exam Vitals and nursing note reviewed.  Constitutional:      General: She is not in acute distress.    Appearance: Normal appearance.  HENT:     Head: Normocephalic and atraumatic.     Right Ear: Tympanic membrane, ear canal and external ear normal. There is impacted cerumen.     Left Ear: Tympanic membrane, ear canal and external ear normal. There is impacted cerumen.  Eyes:     Conjunctiva/sclera: Conjunctivae normal.  Cardiovascular:     Rate and Rhythm: Normal rate and regular rhythm.     Pulses: Normal pulses.     Heart sounds: Normal heart sounds.  Pulmonary:     Effort: Pulmonary effort is normal.     Breath sounds: Normal breath sounds.  Abdominal:     Palpations: Abdomen is soft.     Tenderness: There is no abdominal tenderness.  Musculoskeletal:     Cervical back: Normal range of motion.  Skin:    General: Skin is warm and dry.     Comments: Abrasion to right arm, no signs of infection  Neurological:     General: No focal deficit present.     Mental Status: She is alert and oriented to person, place, and time.  Psychiatric:        Mood and Affect: Mood normal.        Behavior: Behavior normal.        Thought Content: Thought  content normal.        Judgment: Judgment normal.    BP 138/88 (BP Location: Left Arm, Cuff Size: Normal)    Pulse (!) 53    Temp (!) 95.3 F (35.2 C) (Temporal)    Ht 5\' 6"  (1.676 m)    Wt 176 lb 3.2 oz (79.9 kg)    SpO2 97%    BMI 28.44 kg/m  Wt Readings from Last 3 Encounters:  11/08/21 176 lb 3.2 oz (79.9 kg)  09/01/21 176 lb 6.4 oz (80 kg)  05/27/21 178 lb 3.2 oz (80.8 kg)     Health Maintenance Due  Topic Date Due   Zoster Vaccines- Shingrix (1 of 2) Never done    There are no preventive care reminders to display for this patient.  Lab Results  Component Value Date   TSH 1.84 09/02/2021   Lab Results  Component Value Date   WBC 7.5 09/02/2021   HGB 14.5 09/02/2021   HCT 43.8 09/02/2021   MCV 89.1 09/02/2021   PLT 206.0 09/02/2021   Lab Results  Component Value Date   NA 138 09/02/2021   K 4.3 09/02/2021   CO2 28 09/02/2021   GLUCOSE 93 09/02/2021   BUN 17 09/02/2021   CREATININE 0.70 09/02/2021   BILITOT 0.5 10/04/2021   ALKPHOS 92 10/04/2021   AST 33 10/04/2021   ALT 44 (H) 10/04/2021   PROT 6.8 10/04/2021   ALBUMIN 4.6 10/04/2021   CALCIUM 9.4 09/02/2021   GFR 88.68 09/02/2021   Lab Results  Component Value Date   CHOL 186 09/02/2021   Lab  Results  Component Value Date   HDL 48.90 09/02/2021   Lab Results  Component Value Date   LDLCALC 121 (H) 09/02/2021   Lab Results  Component Value Date   TRIG 78.0 09/02/2021   Lab Results  Component Value Date   CHOLHDL 4 09/02/2021   No results found for: HGBA1C    Assessment & Plan:   Problem List Items Addressed This Visit       Digestive   GERD (gastroesophageal reflux disease) - Primary    Ongoing symptoms, not controlled. Recommend she start taking protonix daily. Follow up in 4-6 weeks.       Relevant Medications   polyethylene glycol (MIRALAX / GLYCOLAX) 17 g packet     Musculoskeletal and Integument   Osteoporosis    Noted on DEXA scan in 2021. She has an upcoming DEXA scan  in June 2023. Continue calcium and vitamin D supplement. Will discuss further treatment options including medication after the repeat scan. She had discussed the possibility of starting prolia, but this never got started.       Relevant Medications   cholecalciferol (VITAMIN D3) 25 MCG (1000 UNIT) tablet   Bursitis of right hip    Chronic, stable. She takes ibuprofen as needed for pain. Follow up if symptoms worsen or with any concerns.         Genitourinary   Vaginal dryness, menopausal    Chronic, endorses some vaginal dryness still. She has been using premarin vaginal cream twice every 10 days, can increase that to twice every 7 days to see if more frequent application helps her symptoms.         Other   Anxiety    Chronic, ongoing. Currently taking klonopin as needed, which is approximately twice a week. Discussed that klonopin has side effects and can be addicting. She has tried bupropion, buspar, celexa, and sertaline in the past without it helping her symptoms. With ongoing symptoms, will start abilify 2mg  daily. Discussed possible side effects and to call or go to ER if develop SI/HI. Follow up in 4-6 weeks or sooner with concerns.       Constipation    She states she has regular bowel movements, but her stools tend to be harder. She can use docusate daily to help soften stools and then miralax prn constipation. Encouraged her to make sure she is drinking plenty of fluids and eating foods with fiber.       Elevated LFTs    ALT was 53 in December 2022, then 44 in January 2023. Discussed limiting tylenol and alcohol. Hepatitis C was negative in 2020. Will repeat next month and if still elevated, will obtain ultrasound of her liver.       Other Visit Diagnoses     Bilateral impacted cerumen       Bilateral ears irrigated with good results. Patient tolerated well. No signs of external or otitis media.    Open wound of skin       Abrasion to right arm healing well. Will update  tetanus today.    Relevant Orders   Tdap vaccine greater than or equal to 7yo IM (Completed)       Meds ordered this encounter  Medications   ARIPiprazole (ABILIFY) 2 MG tablet    Sig: Take 1 tablet (2 mg total) by mouth daily.    Dispense:  30 tablet    Refill:  2    Follow-up: Return in about 4 weeks (around 12/06/2021) for 4-6  weeks , Anxiety.    Charyl Dancer, NP

## 2021-11-08 ENCOUNTER — Other Ambulatory Visit: Payer: Self-pay

## 2021-11-08 ENCOUNTER — Ambulatory Visit (INDEPENDENT_AMBULATORY_CARE_PROVIDER_SITE_OTHER): Payer: Medicare HMO | Admitting: Nurse Practitioner

## 2021-11-08 ENCOUNTER — Encounter: Payer: Self-pay | Admitting: Nurse Practitioner

## 2021-11-08 VITALS — BP 138/88 | HR 53 | Temp 95.3°F | Ht 66.0 in | Wt 176.2 lb

## 2021-11-08 DIAGNOSIS — R69 Illness, unspecified: Secondary | ICD-10-CM | POA: Diagnosis not present

## 2021-11-08 DIAGNOSIS — F419 Anxiety disorder, unspecified: Secondary | ICD-10-CM

## 2021-11-08 DIAGNOSIS — N951 Menopausal and female climacteric states: Secondary | ICD-10-CM | POA: Diagnosis not present

## 2021-11-08 DIAGNOSIS — R7989 Other specified abnormal findings of blood chemistry: Secondary | ICD-10-CM | POA: Diagnosis not present

## 2021-11-08 DIAGNOSIS — H6123 Impacted cerumen, bilateral: Secondary | ICD-10-CM | POA: Diagnosis not present

## 2021-11-08 DIAGNOSIS — M7071 Other bursitis of hip, right hip: Secondary | ICD-10-CM | POA: Diagnosis not present

## 2021-11-08 DIAGNOSIS — K5904 Chronic idiopathic constipation: Secondary | ICD-10-CM

## 2021-11-08 DIAGNOSIS — Z23 Encounter for immunization: Secondary | ICD-10-CM | POA: Diagnosis not present

## 2021-11-08 DIAGNOSIS — M810 Age-related osteoporosis without current pathological fracture: Secondary | ICD-10-CM

## 2021-11-08 DIAGNOSIS — T148XXA Other injury of unspecified body region, initial encounter: Secondary | ICD-10-CM

## 2021-11-08 DIAGNOSIS — K219 Gastro-esophageal reflux disease without esophagitis: Secondary | ICD-10-CM | POA: Diagnosis not present

## 2021-11-08 MED ORDER — ARIPIPRAZOLE 2 MG PO TABS: 2.0000 mg | ORAL_TABLET | Freq: Every day | ORAL | 2 refills | Status: DC

## 2021-11-08 NOTE — Assessment & Plan Note (Signed)
ALT was 53 in December 2022, then 44 in January 2023. Discussed limiting tylenol and alcohol. Hepatitis C was negative in 2020. Will repeat next month and if still elevated, will obtain ultrasound of her liver.

## 2021-11-08 NOTE — Assessment & Plan Note (Signed)
Chronic, stable. She takes ibuprofen as needed for pain. Follow up if symptoms worsen or with any concerns.

## 2021-11-08 NOTE — Assessment & Plan Note (Signed)
She states she has regular bowel movements, but her stools tend to be harder. She can use docusate daily to help soften stools and then miralax prn constipation. Encouraged her to make sure she is drinking plenty of fluids and eating foods with fiber.

## 2021-11-08 NOTE — Assessment & Plan Note (Signed)
Ongoing symptoms, not controlled. Recommend she start taking protonix daily. Follow up in 4-6 weeks.

## 2021-11-08 NOTE — Patient Instructions (Signed)
It was great to see you!  Start protonix daily  Keep taking calcium and vitamin D and then all the other supplements can be taken as a multivitamin daily.   You can take colace (docusate) stool softener daily and miralax prn.  Start abilify 1 tablet daily for your anxiety  Let's follow-up in 4-6 weeks, sooner if you have concerns.  If a referral was placed today, you will be contacted for an appointment. Please note that routine referrals can sometimes take up to 3-4 weeks to process. Please call our office if you haven't heard anything after this time frame.  Take care,  Vance Peper, NP

## 2021-11-08 NOTE — Assessment & Plan Note (Signed)
Chronic, endorses some vaginal dryness still. She has been using premarin vaginal cream twice every 10 days, can increase that to twice every 7 days to see if more frequent application helps her symptoms.

## 2021-11-08 NOTE — Assessment & Plan Note (Signed)
Noted on DEXA scan in 2021. She has an upcoming DEXA scan in June 2023. Continue calcium and vitamin D supplement. Will discuss further treatment options including medication after the repeat scan. She had discussed the possibility of starting prolia, but this never got started.

## 2021-11-08 NOTE — Assessment & Plan Note (Signed)
Chronic, ongoing. Currently taking klonopin as needed, which is approximately twice a week. Discussed that klonopin has side effects and can be addicting. She has tried bupropion, buspar, celexa, and sertaline in the past without it helping her symptoms. With ongoing symptoms, will start abilify 2mg  daily. Discussed possible side effects and to call or go to ER if develop SI/HI. Follow up in 4-6 weeks or sooner with concerns.

## 2021-11-09 NOTE — Progress Notes (Signed)
Subjective: 71 year old female presents the office today for concerns of thick, elongated toenails that she can has difficulty trimming herself.  She has questions about using a topical antifungal.  Previously she had an ankle fracture and hardware is prominent does not cause any pain.  She said some ongoing swelling since the fracture but has not changed.  She has no other concerns today.  Objective: AAO x3, NAD DP/PT pulses palpable bilaterally, CRT less than 3 seconds Nails are hypertrophic, dystrophic, brittle, discolored, elongated 10. No surrounding redness or drainage. Tenderness nails 1-5 bilaterally. No open lesions or pre-ulcerative lesions are identified today Hyperkeratotic lesions in the medial first MPJ as well as the metatarsal foot without any underlying ulceration drainage or signs of infection. Hardware noted edema present.  Prominent hardware is present on the ankle and there is mild chronic edema present to the ankle no significant pain with range of motion. No pain with calf compression, swelling, warmth, erythema  Assessment: Symptomatic onychomycosis, hyperkeratotic lesions  Plan: -All treatment options discussed with the patient including all alternatives, risks, complications.  -Sharply to the nails x10 without any complications or bleeding.  Discussed topical medication.  Continue Jublia. -Sharply debrided the mild hyperkeratotic lesion x4 without any complications or bleeding -Continue range of motion exercises to the ankle.  The hardware although prominent is not causing any pain.  In the future consider removal if needed but for now and continue to monitor. -Patient encouraged to call the office with any questions, concerns, change in symptoms.   Trula Slade DPM

## 2021-11-30 ENCOUNTER — Encounter: Payer: Medicare HMO | Admitting: Nurse Practitioner

## 2021-12-06 ENCOUNTER — Encounter: Payer: Self-pay | Admitting: Nurse Practitioner

## 2021-12-06 ENCOUNTER — Ambulatory Visit (INDEPENDENT_AMBULATORY_CARE_PROVIDER_SITE_OTHER): Payer: Medicare HMO | Admitting: Nurse Practitioner

## 2021-12-06 ENCOUNTER — Other Ambulatory Visit: Payer: Self-pay

## 2021-12-06 VITALS — BP 130/68 | HR 70 | Temp 97.8°F | Ht 66.0 in | Wt 176.8 lb

## 2021-12-06 DIAGNOSIS — K219 Gastro-esophageal reflux disease without esophagitis: Secondary | ICD-10-CM | POA: Diagnosis not present

## 2021-12-06 DIAGNOSIS — E785 Hyperlipidemia, unspecified: Secondary | ICD-10-CM

## 2021-12-06 DIAGNOSIS — F419 Anxiety disorder, unspecified: Secondary | ICD-10-CM

## 2021-12-06 DIAGNOSIS — R69 Illness, unspecified: Secondary | ICD-10-CM | POA: Diagnosis not present

## 2021-12-06 DIAGNOSIS — R7989 Other specified abnormal findings of blood chemistry: Secondary | ICD-10-CM

## 2021-12-06 DIAGNOSIS — K5904 Chronic idiopathic constipation: Secondary | ICD-10-CM

## 2021-12-06 MED ORDER — CLONAZEPAM 0.5 MG PO TABS: 0.5000 mg | ORAL_TABLET | Freq: Two times a day (BID) | ORAL | 0 refills | Status: DC | PRN

## 2021-12-06 MED ORDER — SUCRALFATE 1 G PO TABS: 1.0000 g | ORAL_TABLET | Freq: Three times a day (TID) | ORAL | 0 refills | Status: DC

## 2021-12-06 NOTE — Patient Instructions (Signed)
It was great to see you! ? ?Start carafate 4 times a day before food and at bedtime. I have placed a referral to GI for evaluation and possible endoscopy.  ? ?Keep walking and exercising daily. I have refilled your klonopin.  ? ?Come back for a lab visit fasting.  ? ?Let's follow-up in 2 months, sooner if you have concerns. ? ?If a referral was placed today, you will be contacted for an appointment. Please note that routine referrals can sometimes take up to 3-4 weeks to process. Please call our office if you haven't heard anything after this time frame. ? ?Take care, ? ?Vance Peper, NP ? ?

## 2021-12-06 NOTE — Progress Notes (Signed)
? ?Established Patient Office Visit ? ?Subjective:  ?Patient ID: Lisa Spence, female    DOB: 1951-05-04  Age: 71 y.o. MRN: [id number] ? ?CC:  ?Chief Complaint  ?Patient presents with  ? Follow-up  ?  4 wk f/u anxiety  ? ? ?HPI ?Lisa Spence presents for follow-up on anxiety. She was started on abilify '2mg'$  daily last visit, however she has not started this medication. She started going for a daily walk and talking with a therapist. She feels like this has helped her symptoms. She has only taken her klonopin 3 times in the last 4 weeks.  ? ?She was also started on protonix daily for GERD. She doesn't feel like this made any difference. She also takes tums 2-3 times a day which does not help her symptoms. She eats from 4-5am through 2pm. She doesn't smoke. She does not notice any specific foods trigger her symptoms. She endorses burping and nausea. Denies vomiting.  She denies any weight loss. She is taking flonase and an allergy pill for recent flareup of allergy symptoms.  She would like a referral for a possible endoscopy. ? ?Depression screen [facility] 2/9 12/06/2021 11/08/2021 05/27/2021 05/27/2021 05/17/2021  ?Decreased Interest 0 0 0 0 0  ?Down, Depressed, Hopeless 0 0 0 0 0  ?PHQ - 2 Score 0 0 0 0 0  ?Altered sleeping 0 1 1 - -  ?Tired, decreased energy 0 1 0 - -  ?Change in appetite 0 1 1 - -  ?Feeling bad or failure about yourself  0 0 0 - -  ?Trouble concentrating 0 1 0 - -  ?Moving slowly or fidgety/restless 0 1 0 - -  ?Suicidal thoughts 0 0 0 - -  ?PHQ-9 Score 0 5 2 - -  ?Difficult doing work/chores Not difficult at all Somewhat difficult Not difficult at all - -  ? ?GAD 7 : Generalized Anxiety Score 12/06/2021 11/08/2021 05/27/2021  ?Nervous, Anxious, on Edge '2 2 2  '$ ?Control/stop worrying '1 1 1  '$ ?Worry too much - different things 0 0 0  ?Trouble relaxing '1 2 1  '$ ?Restless 1 0 0  ?Easily annoyed or irritable '1 1 1  '$ ?Afraid - awful might happen 0 0 0  ?Total GAD 7 Score '6 6 5  '$ ?Anxiety Difficulty - Somewhat difficult  Not difficult at all  ? ? ?Past Medical History:  ?Diagnosis Date  ? Allergy   ? Anxiety   ? GERD (gastroesophageal reflux disease)   ? Osteoporosis   ? ? ?Past Surgical History:  ?Procedure Laterality Date  ? ABDOMINAL HYSTERECTOMY    ? COLONOSCOPY    ? ORIF ANKLE FRACTURE Left 07/09/2017  ? Procedure: OPEN REDUCTION INTERNAL FIXATION (ORIF) ANKLE FRACTURE;  Surgeon: Nicholes Stairs, MD;  Location: [place];  Service: Orthopedics;  Laterality: Left;  90 mins  ? ? ?Family History  ?Problem Relation Age of Onset  ? Heart disease Mother   ? Heart disease Father   ? Heart disease Maternal Grandmother   ? Heart disease Maternal Grandfather   ? Heart disease Paternal Grandmother   ? Heart disease Paternal Grandfather   ? Colon cancer Neg Hx   ? Esophageal cancer Neg Hx   ? Rectal cancer Neg Hx   ? Stomach cancer Neg Hx   ? ? ?Social History  ? ?Socioeconomic History  ? Marital status: Single  ?  Spouse name: Not on file  ? Number of children: Not on file  ? Years of  education: Not on file  ? Highest education level: Not on file  ?Occupational History  ? Not on file  ?Tobacco Use  ? Smoking status: Never  ? Smokeless tobacco: Never  ?Vaping Use  ? Vaping Use: Never used  ?Substance and Sexual Activity  ? Alcohol use: No  ?  Comment: no longer drinks, drank 6 glasses wine/week stopped in 2007  ? Drug use: No  ? Sexual activity: Not Currently  ?Other Topics Concern  ? Not on file  ?Social History Narrative  ? Not on file  ? ?Social Determinants of Health  ? ?Financial Resource Strain: Low Risk   ? Difficulty of Paying Living Expenses: Not hard at all  ?Food Insecurity: No Food Insecurity  ? Worried About Charity fundraiser in the Last Year: Never true  ? Ran Out of Food in the Last Year: Never true  ?Transportation Needs: No Transportation Needs  ? Lack of Transportation (Medical): No  ? Lack of Transportation (Non-Medical): No  ?Physical Activity: Insufficiently Active  ? Days of Exercise per Week: 3 days  ? Minutes of  Exercise per Session: 30 min  ?Stress: No Stress Concern Present  ? Feeling of Stress : Only a little  ?Social Connections: Unknown  ? Frequency of Communication with Friends and Family: More than three times a week  ? Frequency of Social Gatherings with Friends and Family: More than three times a week  ? Attends Religious Services: More than 4 times per year  ? Active Member of Clubs or Organizations: Yes  ? Attends Archivist Meetings: More than 4 times per year  ? Marital Status: Not on file  ?Intimate Partner Violence: Not At Risk  ? Fear of Current or Ex-Partner: No  ? Emotionally Abused: No  ? Physically Abused: No  ? Sexually Abused: No  ? ? ?Outpatient Medications Prior to Visit  ?Medication Sig Dispense Refill  ? ARIPiprazole (ABILIFY) 2 MG tablet Take 1 tablet (2 mg total) by mouth daily. 30 tablet 2  ? calcium carbonate (OS-CAL) 1250 (500 Ca) MG chewable tablet Chew 1 tablet by mouth daily.    ? cholecalciferol (VITAMIN D3) 25 MCG (1000 UNIT) tablet Take 1,000 Units by mouth daily.    ? Efinaconazole 10 % SOLN Apply topically.    ? hydrocortisone 2.5 % cream Apply topically daily.    ? ketoconazole (NIZORAL) 2 % cream Apply topically daily.    ? Misc Natural Products (FOCUSED MIND PO) Take by mouth. Daily vitamins    ? nystatin cream (MYCOSTATIN) Apply topically 2 (two) times daily.    ? pantoprazole (PROTONIX) 40 MG tablet Take 1 tablet (40 mg total) by mouth daily. (Patient taking differently: Take 40 mg by mouth daily as needed.) 90 tablet 1  ? polyethylene glycol (MIRALAX / GLYCOLAX) 17 g packet Take 17 g by mouth daily.    ? PREMARIN vaginal cream PLACE 1 APPLICATORFUL VAGINALLY 2 TIMES A WEEK 30 g 5  ? vitamin C (ASCORBIC ACID) 500 MG tablet Take 500 mg by mouth daily.    ? clonazePAM (KLONOPIN) 0.5 MG tablet Take 1 tablet (0.5 mg total) by mouth 2 (two) times daily as needed for anxiety. 60 tablet 0  ? ?No facility-administered medications prior to visit.  ? ? ?No Known  Allergies ? ?ROS ?Review of Systems ?See pertinent positives and negatives per HPI. ?  ?Objective:  ?  ?Physical Exam ?Vitals and nursing note reviewed.  ?Constitutional:   ?  General: She is not in acute distress. ?   Appearance: Normal appearance.  ?HENT:  ?   Head: Normocephalic and atraumatic.  ?Eyes:  ?   Conjunctiva/sclera: Conjunctivae normal.  ?Cardiovascular:  ?   Rate and Rhythm: Normal rate and regular rhythm.  ?   Pulses: Normal pulses.  ?   Heart sounds: Normal heart sounds.  ?Pulmonary:  ?   Effort: Pulmonary effort is normal.  ?   Breath sounds: Normal breath sounds.  ?Abdominal:  ?   Palpations: Abdomen is soft.  ?   Tenderness: There is no abdominal tenderness.  ?Musculoskeletal:  ?   Cervical back: Normal range of motion.  ?Skin: ?   General: Skin is warm and dry.  ?Neurological:  ?   General: No focal deficit present.  ?   Mental Status: She is alert and oriented to person, place, and time.  ?Psychiatric:     ?   Mood and Affect: Mood normal.     ?   Behavior: Behavior normal.     ?   Thought Content: Thought content normal.     ?   Judgment: Judgment normal.  ? ? ?BP 130/68 (BP Location: Right Leg, Patient Position: Sitting, Cuff Size: Normal)   Pulse 70   Temp 97.8 ?F (36.6 ?C) (Temporal)   Ht '5\' 6"'$  (1.676 m)   Wt 176 lb 12.8 oz (80.2 kg)   SpO2 98%   BMI 28.54 kg/m?  ?Wt Readings from Last 3 Encounters:  ?12/06/21 176 lb 12.8 oz (80.2 kg)  ?11/08/21 176 lb 3.2 oz (79.9 kg)  ?09/01/21 176 lb 6.4 oz (80 kg)  ? ? ? ?There are no preventive care reminders to display for this patient. ? ? ?There are no preventive care reminders to display for this patient. ? ?Lab Results  ?Component Value Date  ? TSH 1.84 09/02/2021  ? ?Lab Results  ?Component Value Date  ? WBC 7.5 09/02/2021  ? HGB 14.5 09/02/2021  ? HCT 43.8 09/02/2021  ? MCV 89.1 09/02/2021  ? PLT 206.0 09/02/2021  ? ?Lab Results  ?Component Value Date  ? NA 138 09/02/2021  ? K 4.3 09/02/2021  ? CO2 28 09/02/2021  ? GLUCOSE 93 09/02/2021   ? BUN 17 09/02/2021  ? CREATININE 0.70 09/02/2021  ? BILITOT 0.5 10/04/2021  ? ALKPHOS 92 10/04/2021  ? AST 33 10/04/2021  ? ALT 44 (H) 10/04/2021  ? PROT 6.8 10/04/2021  ? ALBUMIN 4.6 10/04/2021  ? CALCIUM 9.4 09/02/2021  ?

## 2021-12-06 NOTE — Assessment & Plan Note (Addendum)
History of elevated LDL back in 08/2021.  She states this is the first time she has ever had elevated cholesterol.  She would like this rechecked.  We will check fasting lipid panel when patient able to come back for labs.  We will also check CMP, CBC.  Treat based on results. ?

## 2021-12-06 NOTE — Assessment & Plan Note (Signed)
Has improved with starting MiraLAX twice a week.  Continue this regimen.  Follow-up with any concerns ?

## 2021-12-06 NOTE — Assessment & Plan Note (Signed)
Chronic, ongoing.  No improvement after starting pantoprazole 40 mg daily.  We will start Carafate 4 times a day before meals and at bedtime.  Continue eating small meals and not eating before laying down.  Discussed elevating her head at night.  Will place referral to GI per patient request and possible endoscopy. ?

## 2021-12-06 NOTE — Assessment & Plan Note (Signed)
Chronic, stable.  She states that increasing her exercise has helped with her mood significantly.  She has only taken her Klonopin 3 times in the last 4 weeks.  We will send in a refill of Klonopin 0.5 mg twice daily as needed anxiety.  Discussed side effects, reviewed PDMP, and she would like to continue this.  She did not start Abilify as prescribed last visit.  We will have her follow-up in 2 months. ?

## 2021-12-06 NOTE — Assessment & Plan Note (Signed)
History of elevated elevated LFTs in the past.  We will repeat this when she comes in for labs fasting.  Treat based on results. ?

## 2021-12-09 ENCOUNTER — Other Ambulatory Visit: Payer: Self-pay

## 2021-12-09 ENCOUNTER — Telehealth: Payer: Self-pay | Admitting: Nurse Practitioner

## 2021-12-09 ENCOUNTER — Other Ambulatory Visit (INDEPENDENT_AMBULATORY_CARE_PROVIDER_SITE_OTHER): Payer: Medicare HMO

## 2021-12-09 DIAGNOSIS — E785 Hyperlipidemia, unspecified: Secondary | ICD-10-CM | POA: Diagnosis not present

## 2021-12-09 DIAGNOSIS — R7989 Other specified abnormal findings of blood chemistry: Secondary | ICD-10-CM

## 2021-12-09 LAB — CBC
HCT: 44.6 % (ref 36.0–46.0)
Hemoglobin: 14.9 g/dL (ref 12.0–15.0)
MCHC: 33.5 g/dL (ref 30.0–36.0)
MCV: 88.4 fl (ref 78.0–100.0)
Platelets: 227 10*3/uL (ref 150.0–400.0)
RBC: 5.04 Mil/uL (ref 3.87–5.11)
RDW: 13 % (ref 11.5–15.5)
WBC: 7.9 10*3/uL (ref 4.0–10.5)

## 2021-12-09 LAB — COMPREHENSIVE METABOLIC PANEL
ALT: 51 U/L — ABNORMAL HIGH (ref 0–35)
AST: 34 U/L (ref 0–37)
Albumin: 4.8 g/dL (ref 3.5–5.2)
Alkaline Phosphatase: 92 U/L (ref 39–117)
BUN: 18 mg/dL (ref 6–23)
CO2: 28 mEq/L (ref 19–32)
Calcium: 9.7 mg/dL (ref 8.4–10.5)
Chloride: 103 mEq/L (ref 96–112)
Creatinine, Ser: 0.78 mg/dL (ref 0.40–1.20)
GFR: 77.73 mL/min (ref >60.00)
Glucose, Bld: 92 mg/dL (ref 70–99)
Potassium: 4.1 mEq/L (ref 3.5–5.1)
Sodium: 140 mEq/L (ref 135–145)
Total Bilirubin: 0.4 mg/dL (ref 0.2–1.2)
Total Protein: 7.3 g/dL (ref 6.0–8.3)

## 2021-12-09 LAB — LIPID PANEL
Cholesterol: 204 mg/dL — ABNORMAL HIGH (ref 0–200)
HDL: 54.7 mg/dL (ref >39.00)
LDL Cholesterol: 130 mg/dL — ABNORMAL HIGH (ref 0–99)
NonHDL: 149.71
Total CHOL/HDL Ratio: 4
Triglycerides: 101 mg/dL (ref 0.0–149.0)
VLDL: 20.2 mg/dL (ref 0.0–40.0)

## 2021-12-09 NOTE — Progress Notes (Signed)
Per the orders of Vance Peper NP, pt is here for labs. Pt tolerated draw well.  ?

## 2021-12-09 NOTE — Progress Notes (Signed)
? ?Established Patient Office Visit ? ?Subjective:  ?Patient ID: Lisa Spence, female    DOB: August 24, 1951  Age: 71 y.o. MRN: [id number] ? ?CC:  ?Chief Complaint  ?Patient presents with  ? Follow-up  ?  1 wk f/u go over labs  ? ? ?HPI ?Lisa Spence presents for follow-up discussion on recent lab results.  She is concerned that her ALT has been elevated for the past 3 months.  She does not drink alcohol, take a lot of Tylenol.  She denies abdominal pain, however she does have a history of acid reflux.  This has improved since starting Carafate 4 times a day. ? ?She is also concerned that her cholesterol is elevated.  This has been ongoing for the past few months as well.  She does not eat red meat or fried/fatty foods.  She eats fish a couple of times a week otherwise she does not eat meat.  She does endorse that she eats a lot of cheese, she can eat a block of cheese in about 2 days.  She denies chest pain, shortness of breath. ? ?The 10-year ASCVD risk score (Arnett DK, et al., 2019) is: 9.6% ?  Values used to calculate the score: ?    Age: 61 years ?    Sex: Female ?    Is Non-Hispanic African American: No ?    Diabetic: No ?    Tobacco smoker: No ?    Systolic Blood Pressure: 892 mmHg ?    Is BP treated: No ?    HDL Cholesterol: 54.7 mg/dL ?    Total Cholesterol: 204 mg/dL ? ? ?Past Medical History:  ?Diagnosis Date  ? Allergy   ? Anxiety   ? GERD (gastroesophageal reflux disease)   ? Osteoporosis   ? ? ?Past Surgical History:  ?Procedure Laterality Date  ? ABDOMINAL HYSTERECTOMY    ? COLONOSCOPY    ? ORIF ANKLE FRACTURE Left 07/09/2017  ? Procedure: OPEN REDUCTION INTERNAL FIXATION (ORIF) ANKLE FRACTURE;  Surgeon: Nicholes Stairs, MD;  Location: [place];  Service: Orthopedics;  Laterality: Left;  90 mins  ? ? ?Family History  ?Problem Relation Age of Onset  ? Heart disease Mother   ? Heart disease Father   ? Heart disease Maternal Grandmother   ? Heart disease Maternal Grandfather   ? Heart disease  Paternal Grandmother   ? Heart disease Paternal Grandfather   ? Colon cancer Neg Hx   ? Esophageal cancer Neg Hx   ? Rectal cancer Neg Hx   ? Stomach cancer Neg Hx   ? ? ?Social History  ? ?Socioeconomic History  ? Marital status: Single  ?  Spouse name: Not on file  ? Number of children: Not on file  ? Years of education: Not on file  ? Highest education level: Not on file  ?Occupational History  ? Not on file  ?Tobacco Use  ? Smoking status: Never  ? Smokeless tobacco: Never  ?Vaping Use  ? Vaping Use: Never used  ?Substance and Sexual Activity  ? Alcohol use: No  ?  Comment: no longer drinks, drank 6 glasses wine/week stopped in 2007  ? Drug use: No  ? Sexual activity: Not Currently  ?Other Topics Concern  ? Not on file  ?Social History Narrative  ? Not on file  ? ?Social Determinants of Health  ? ?Financial Resource Strain: Low Risk   ? Difficulty of Paying Living Expenses: Not hard at all  ?Food Insecurity:  No Food Insecurity  ? Worried About Charity fundraiser in the Last Year: Never true  ? Ran Out of Food in the Last Year: Never true  ?Transportation Needs: No Transportation Needs  ? Lack of Transportation (Medical): No  ? Lack of Transportation (Non-Medical): No  ?Physical Activity: Insufficiently Active  ? Days of Exercise per Week: 3 days  ? Minutes of Exercise per Session: 30 min  ?Stress: No Stress Concern Present  ? Feeling of Stress : Only a little  ?Social Connections: Unknown  ? Frequency of Communication with Friends and Family: More than three times a week  ? Frequency of Social Gatherings with Friends and Family: More than three times a week  ? Attends Religious Services: More than 4 times per year  ? Active Member of Clubs or Organizations: Yes  ? Attends Archivist Meetings: More than 4 times per year  ? Marital Status: Not on file  ?Intimate Partner Violence: Not At Risk  ? Fear of Current or Ex-Partner: No  ? Emotionally Abused: No  ? Physically Abused: No  ? Sexually Abused: No   ? ? ?Outpatient Medications Prior to Visit  ?Medication Sig Dispense Refill  ? ARIPiprazole (ABILIFY) 2 MG tablet Take 1 tablet (2 mg total) by mouth daily. 30 tablet 2  ? calcium carbonate (OS-CAL) 1250 (500 Ca) MG chewable tablet Chew 1 tablet by mouth daily.    ? cholecalciferol (VITAMIN D3) 25 MCG (1000 UNIT) tablet Take 1,000 Units by mouth daily.    ? clonazePAM (KLONOPIN) 0.5 MG tablet Take 1 tablet (0.5 mg total) by mouth 2 (two) times daily as needed for anxiety. 60 tablet 0  ? Efinaconazole 10 % SOLN Apply topically.    ? hydrocortisone 2.5 % cream Apply topically daily.    ? ketoconazole (NIZORAL) 2 % cream Apply topically daily.    ? Misc Natural Products (FOCUSED MIND PO) Take by mouth. Daily vitamins    ? nystatin cream (MYCOSTATIN) Apply topically 2 (two) times daily.    ? pantoprazole (PROTONIX) 40 MG tablet Take 1 tablet (40 mg total) by mouth daily. (Patient taking differently: Take 40 mg by mouth daily as needed.) 90 tablet 1  ? polyethylene glycol (MIRALAX / GLYCOLAX) 17 g packet Take 17 g by mouth daily.    ? PREMARIN vaginal cream PLACE 1 APPLICATORFUL VAGINALLY 2 TIMES A WEEK 30 g 5  ? sucralfate (CARAFATE) 1 g tablet Take 1 tablet (1 g total) by mouth 4 (four) times daily -  with meals and at bedtime. 120 tablet 0  ? vitamin C (ASCORBIC ACID) 500 MG tablet Take 500 mg by mouth daily.    ? ?No facility-administered medications prior to visit.  ? ? ?No Known Allergies ? ?ROS ?Review of Systems ?See pertinent positives and negatives per HPI. ?  ?Objective:  ?  ?Physical Exam ?Vitals and nursing note reviewed.  ?Constitutional:   ?   General: She is not in acute distress. ?   Appearance: Normal appearance.  ?HENT:  ?   Head: Normocephalic and atraumatic.  ?Eyes:  ?   Conjunctiva/sclera: Conjunctivae normal.  ?Pulmonary:  ?   Effort: Pulmonary effort is normal.  ?Musculoskeletal:  ?   Cervical back: Normal range of motion.  ?Skin: ?   General: Skin is warm and dry.  ?Neurological:  ?   General:  No focal deficit present.  ?   Mental Status: She is alert and oriented to person, place, and time.  ?  Psychiatric:     ?   Mood and Affect: Mood normal.     ?   Behavior: Behavior normal.     ?   Thought Content: Thought content normal.     ?   Judgment: Judgment normal.  ? ? ?BP 136/80 (BP Location: Left Arm, Patient Position: Sitting, Cuff Size: Normal)   Pulse 68   Temp (!) 96.8 ?F (36 ?C) (Temporal)   SpO2 96%  ?Wt Readings from Last 3 Encounters:  ?12/06/21 176 lb 12.8 oz (80.2 kg)  ?11/08/21 176 lb 3.2 oz (79.9 kg)  ?09/01/21 176 lb 6.4 oz (80 kg)  ? ? ? ?There are no preventive care reminders to display for this patient. ? ?There are no preventive care reminders to display for this patient. ? ?Lab Results  ?Component Value Date  ? TSH 1.84 09/02/2021  ? ?Lab Results  ?Component Value Date  ? WBC 7.9 12/09/2021  ? HGB 14.9 12/09/2021  ? HCT 44.6 12/09/2021  ? MCV 88.4 12/09/2021  ? PLT 227.0 12/09/2021  ? ?Lab Results  ?Component Value Date  ? NA 140 12/09/2021  ? K 4.1 12/09/2021  ? CO2 28 12/09/2021  ? GLUCOSE 92 12/09/2021  ? BUN 18 12/09/2021  ? CREATININE 0.78 12/09/2021  ? BILITOT 0.4 12/09/2021  ? ALKPHOS 92 12/09/2021  ? AST 34 12/09/2021  ? ALT 51 (H) 12/09/2021  ? PROT 7.3 12/09/2021  ? ALBUMIN 4.8 12/09/2021  ? CALCIUM 9.7 12/09/2021  ? GFR 77.73 12/09/2021  ? ?Lab Results  ?Component Value Date  ? CHOL 204 (H) 12/09/2021  ? ?Lab Results  ?Component Value Date  ? HDL 54.70 12/09/2021  ? ?Lab Results  ?Component Value Date  ? LDLCALC 130 (H) 12/09/2021  ? ?Lab Results  ?Component Value Date  ? TRIG 101.0 12/09/2021  ? ?Lab Results  ?Component Value Date  ? CHOLHDL 4 12/09/2021  ? ?No results found for: HGBA1C ? ?  ?Assessment & Plan:  ? ?Problem List Items Addressed This Visit   ? ?  ? Other  ? Elevated LFTs - Primary  ?  ALT was elevated 12/09/2021 at 51.  AST and alkaline phosphatase are normal.  Her liver function has been elevated for the past few months.  Hepatitis C was negative in 2020 and  she is low risk for hepatitis C infection.  We will check right upper quadrant ultrasound and repeat labs in 3 months. ?  ?  ? Relevant Orders  ? US Abdomen Limited RUQ (LIVER/GB)  ? CBC with Differential/Platelet

## 2021-12-10 ENCOUNTER — Encounter: Payer: Self-pay | Admitting: Nurse Practitioner

## 2021-12-12 ENCOUNTER — Ambulatory Visit (INDEPENDENT_AMBULATORY_CARE_PROVIDER_SITE_OTHER): Payer: Medicare HMO | Admitting: Nurse Practitioner

## 2021-12-12 ENCOUNTER — Encounter: Payer: Self-pay | Admitting: Nurse Practitioner

## 2021-12-12 ENCOUNTER — Other Ambulatory Visit: Payer: Self-pay

## 2021-12-12 VITALS — BP 136/80 | HR 68 | Temp 96.8°F

## 2021-12-12 DIAGNOSIS — E785 Hyperlipidemia, unspecified: Secondary | ICD-10-CM

## 2021-12-12 DIAGNOSIS — R7989 Other specified abnormal findings of blood chemistry: Secondary | ICD-10-CM | POA: Diagnosis not present

## 2021-12-12 NOTE — Assessment & Plan Note (Signed)
ALT was elevated 12/09/2021 at 51.  AST and alkaline phosphatase are normal.  Her liver function has been elevated for the past few months.  Hepatitis C was negative in 2020 and she is low risk for hepatitis C infection.  We will check right upper quadrant ultrasound and repeat labs in 3 months. ?

## 2021-12-12 NOTE — Assessment & Plan Note (Signed)
Cholesterol is still elevated, ASCVD score is 9.6%.  Discussed diet and exercise.  She does not eat red meat or fried/fatty foods.  She does eat a lot of cheese and she is going to cut back on this to see if this helps her cholesterol numbers.  Discussed that her ASCVD score is midrange for possible starting a statin.  We will start with diet changes, specifically limiting cheese to see if this helps her cholesterol numbers.  Recheck in 3 months. ?

## 2021-12-12 NOTE — Telephone Encounter (Signed)
Discussed at office visit today. 

## 2021-12-12 NOTE — Patient Instructions (Signed)
It was great to see you! ? ?You can start an over the counter omega 3 fish oil daily that can help with your cholesterol. We will recheck your cholesterol levels in 3 months ? ?We are also going to check an ultrasound of your liver.  ? ?Let's follow-up if you have an questions or concerns.  ? ?Take care, ? ?Vance Peper, NP ? ?

## 2021-12-21 ENCOUNTER — Other Ambulatory Visit: Payer: Self-pay

## 2021-12-21 ENCOUNTER — Ambulatory Visit
Admission: RE | Admit: 2021-12-21 | Discharge: 2021-12-21 | Disposition: A | Discharge status: Home / Self Care | Payer: Medicare HMO | Source: Ambulatory Visit | Attending: Nurse Practitioner | Admitting: Nurse Practitioner

## 2021-12-21 DIAGNOSIS — R945 Abnormal results of liver function studies: Secondary | ICD-10-CM | POA: Diagnosis not present

## 2021-12-21 DIAGNOSIS — R7989 Other specified abnormal findings of blood chemistry: Secondary | ICD-10-CM

## 2021-12-21 DIAGNOSIS — K760 Fatty (change of) liver, not elsewhere classified: Secondary | ICD-10-CM | POA: Diagnosis not present

## 2021-12-22 ENCOUNTER — Encounter: Payer: Self-pay | Admitting: Nurse Practitioner

## 2021-12-22 ENCOUNTER — Telehealth: Payer: Self-pay | Admitting: Nurse Practitioner

## 2021-12-22 NOTE — Telephone Encounter (Signed)
Pt is wanting a call back concerning the results of her US Abdomen Limited RUQ (LIVER/GB) (Accession [id number]) (Order [id number]) she had yesterday on 12/21/21. Please advise pt at [phone] ?

## 2021-12-22 NOTE — Addendum Note (Signed)
Addended by: Vance Peper A on: 12/22/2021 09:01 AM ? ? Modules accepted: Orders ? ?

## 2021-12-22 NOTE — Telephone Encounter (Signed)
Called and spoke to patient and clarified imaging note comments. Pt voiced understanding. Sw, cma ?

## 2021-12-23 ENCOUNTER — Other Ambulatory Visit: Payer: Self-pay | Admitting: Family Medicine

## 2021-12-23 DIAGNOSIS — K219 Gastro-esophageal reflux disease without esophagitis: Secondary | ICD-10-CM

## 2021-12-26 ENCOUNTER — Encounter: Payer: Self-pay | Admitting: Nurse Practitioner

## 2021-12-26 ENCOUNTER — Other Ambulatory Visit (INDEPENDENT_AMBULATORY_CARE_PROVIDER_SITE_OTHER): Payer: Medicare HMO

## 2021-12-26 ENCOUNTER — Other Ambulatory Visit: Payer: Self-pay | Admitting: Nurse Practitioner

## 2021-12-26 DIAGNOSIS — R399 Unspecified symptoms and signs involving the genitourinary system: Secondary | ICD-10-CM

## 2021-12-26 DIAGNOSIS — E785 Hyperlipidemia, unspecified: Secondary | ICD-10-CM | POA: Diagnosis not present

## 2021-12-26 DIAGNOSIS — R7989 Other specified abnormal findings of blood chemistry: Secondary | ICD-10-CM

## 2021-12-26 LAB — CBC WITH DIFFERENTIAL/PLATELET
Basophils Absolute: 0.1 10*3/uL (ref 0.0–0.1)
Basophils Relative: 0.8 % (ref 0.0–3.0)
Eosinophils Absolute: 0.5 10*3/uL (ref 0.0–0.7)
Eosinophils Relative: 6.6 % — ABNORMAL HIGH (ref 0.0–5.0)
HCT: 43.8 % (ref 36.0–46.0)
Hemoglobin: 14.6 g/dL (ref 12.0–15.0)
Lymphocytes Relative: 35.7 % (ref 12.0–46.0)
Lymphs Abs: 2.8 10*3/uL (ref 0.7–4.0)
MCHC: 33.4 g/dL (ref 30.0–36.0)
MCV: 88 fl (ref 78.0–100.0)
Monocytes Absolute: 0.4 10*3/uL (ref 0.1–1.0)
Monocytes Relative: 5.8 % (ref 3.0–12.0)
Neutro Abs: 4 10*3/uL (ref 1.4–7.7)
Neutrophils Relative %: 51.1 % (ref 43.0–77.0)
Platelets: 224 10*3/uL (ref 150.0–400.0)
RBC: 4.97 Mil/uL (ref 3.87–5.11)
RDW: 13.3 % (ref 11.5–15.5)
WBC: 7.8 10*3/uL (ref 4.0–10.5)

## 2021-12-26 LAB — COMPREHENSIVE METABOLIC PANEL
ALT: 62 U/L — ABNORMAL HIGH (ref 0–35)
AST: 42 U/L — ABNORMAL HIGH (ref 0–37)
Albumin: 4.9 g/dL (ref 3.5–5.2)
Alkaline Phosphatase: 91 U/L (ref 39–117)
BUN: 16 mg/dL (ref 6–23)
CO2: 27 mEq/L (ref 19–32)
Calcium: 9.5 mg/dL (ref 8.4–10.5)
Chloride: 105 mEq/L (ref 96–112)
Creatinine, Ser: 0.75 mg/dL (ref 0.40–1.20)
GFR: 81.45 mL/min (ref >60.00)
Glucose, Bld: 92 mg/dL (ref 70–99)
Potassium: 3.9 mEq/L (ref 3.5–5.1)
Sodium: 141 mEq/L (ref 135–145)
Total Bilirubin: 0.3 mg/dL (ref 0.2–1.2)
Total Protein: 7.3 g/dL (ref 6.0–8.3)

## 2021-12-26 LAB — LIPID PANEL
Cholesterol: 195 mg/dL (ref 0–200)
HDL: 53.3 mg/dL (ref >39.00)
LDL Cholesterol: 126 mg/dL — ABNORMAL HIGH (ref 0–99)
NonHDL: 141.64
Total CHOL/HDL Ratio: 4
Triglycerides: 76 mg/dL (ref 0.0–149.0)
VLDL: 15.2 mg/dL (ref 0.0–40.0)

## 2021-12-26 NOTE — Progress Notes (Signed)
Per the orders of  Vance Peper NP, pt is here for labs, pt tolerated draw well. ? ? ?

## 2021-12-26 NOTE — Progress Notes (Signed)
Patient requesting UA today with other labs due. ?

## 2021-12-27 ENCOUNTER — Other Ambulatory Visit: Payer: Self-pay | Admitting: Nurse Practitioner

## 2021-12-27 DIAGNOSIS — R399 Unspecified symptoms and signs involving the genitourinary system: Secondary | ICD-10-CM

## 2021-12-27 LAB — POCT URINALYSIS DIPSTICK
Bilirubin, UA: NEGATIVE
Blood, UA: NEGATIVE
Glucose, UA: NEGATIVE
Ketones, UA: NEGATIVE
Leukocytes, UA: NEGATIVE
Nitrite, UA: NEGATIVE
Protein, UA: NEGATIVE
Spec Grav, UA: 1.02 (ref 1.010–1.025)
Urobilinogen, UA: 0.2 E.U./dL
pH, UA: 6 (ref 5.0–8.0)

## 2021-12-29 ENCOUNTER — Encounter: Payer: Self-pay | Admitting: Nurse Practitioner

## 2021-12-29 LAB — NASH FIBROSURE
ALPHA 2-MACROGLOBULINS, QN: 178 mg/dL (ref 110–276)
ALT (SGPT) P5P: 80 IU/L — ABNORMAL HIGH (ref 0–40)
AST (SGOT) P5P: 49 IU/L — ABNORMAL HIGH (ref 0–40)
Apolipoprotein A-1: 165 mg/dL (ref 116–209)
Bilirubin, Total: 0.1 mg/dL (ref 0.0–1.2)
Cholesterol, Total: 210 mg/dL — ABNORMAL HIGH (ref 100–199)
Fibrosis Score: 0.06 (ref 0.00–0.21)
GGT: 29 IU/L (ref 0–60)
Glucose: 88 mg/dL (ref 70–99)
Haptoglobin: 146 mg/dL (ref 37–355)
Height: 66 in
NASH Score: 0.5 — ABNORMAL HIGH
Steatosis Score: 0.63 — ABNORMAL HIGH (ref 0.00–0.30)
Triglycerides: 95 mg/dL (ref 0–149)
Weight: 176 [lb_av]

## 2022-01-03 ENCOUNTER — Other Ambulatory Visit: Payer: Self-pay | Admitting: Nurse Practitioner

## 2022-01-03 NOTE — Telephone Encounter (Signed)
Chart supports rx refill ?Last ov: 12/12/21 ?Last refill: 12/06/21 ?

## 2022-01-04 ENCOUNTER — Encounter: Payer: Self-pay | Admitting: Gastroenterology

## 2022-01-04 ENCOUNTER — Ambulatory Visit: Payer: Medicare HMO | Admitting: Gastroenterology

## 2022-01-04 ENCOUNTER — Other Ambulatory Visit (INDEPENDENT_AMBULATORY_CARE_PROVIDER_SITE_OTHER): Payer: Medicare HMO

## 2022-01-04 VITALS — BP 130/80 | HR 76 | Ht 65.0 in | Wt 182.5 lb

## 2022-01-04 DIAGNOSIS — K219 Gastro-esophageal reflux disease without esophagitis: Secondary | ICD-10-CM

## 2022-01-04 DIAGNOSIS — R7989 Other specified abnormal findings of blood chemistry: Secondary | ICD-10-CM

## 2022-01-04 DIAGNOSIS — R142 Eructation: Secondary | ICD-10-CM | POA: Diagnosis not present

## 2022-01-04 LAB — HEPATIC FUNCTION PANEL
ALT: 59 U/L — ABNORMAL HIGH (ref 0–35)
AST: 33 U/L (ref 0–37)
Albumin: 4.9 g/dL (ref 3.5–5.2)
Alkaline Phosphatase: 91 U/L (ref 39–117)
Bilirubin, Direct: 0.1 mg/dL (ref 0.0–0.3)
Total Bilirubin: 0.3 mg/dL (ref 0.2–1.2)
Total Protein: 7.3 g/dL (ref 6.0–8.3)

## 2022-01-04 LAB — IBC + FERRITIN
Ferritin: 48.4 ng/mL (ref 10.0–291.0)
Iron: 74 ug/dL (ref 42–145)
Saturation Ratios: 19.4 % — ABNORMAL LOW (ref 20.0–50.0)
TIBC: 382.2 ug/dL (ref 250.0–450.0)
Transferrin: 273 mg/dL (ref 212.0–360.0)

## 2022-01-04 MED ORDER — PANTOPRAZOLE SODIUM 40 MG PO TBEC: 40.0000 mg | DELAYED_RELEASE_TABLET | Freq: Two times a day (BID) | ORAL | 3 refills | Status: DC

## 2022-01-04 MED ORDER — FAMOTIDINE 20 MG PO TABS: 20.0000 mg | ORAL_TABLET | Freq: Every day | ORAL | 3 refills | Status: DC

## 2022-01-04 NOTE — Patient Instructions (Signed)
Your provider has requested that you go to the basement level for lab work before leaving today. Press "B" on the elevator. The lab is located at the first door on the left as you exit the elevator. ? ?We have sent the following medications to your pharmacy for you to pick up at your convenience: ?Pantoprazole 40 mg take 1 tablet two times daily 30-60 minutes before breakfast and dinner. ?Pepcid 20 mg nightly. ? ?Follow up in 6-8 weeks.  ? ?If you are age 45 or older, your body mass index should be between 23-30. Your Body mass index is 30.37 kg/m?Marland Kitchen If this is out of the aforementioned range listed, please consider follow up with your Primary Care Provider. ? ?If you are age 7 or younger, your body mass index should be between 19-25. Your Body mass index is 30.37 kg/m?Marland Kitchen If this is out of the aformentioned range listed, please consider follow up with your Primary Care Provider.  ? ?________________________________________________________ ? ?The [facility] providers would like to encourage you to use [facility] to communicate with providers for non-urgent requests or questions.  Due to long hold times on the telephone, sending your provider a message by [facility] may be a faster and more efficient way to get a response.  Please allow 48 business hours for a response.  Please remember that this is for non-urgent requests.  ?_______________________________________________________ ? ? ?

## 2022-01-04 NOTE — Progress Notes (Signed)
? ? ? ?01/04/2022 ?[name] ?[id number] ?Apr 09, 1951 ? ? ?HISTORY OF PRESENT ILLNESS: This is a 71 year old female who is a patient of Dr. Woodward Ku, known her for colonoscopy.  She is here today for evaluation of some upper GI symptoms.  She tells me that she has had some heartburn and reflux/indigestion type symptoms intermittently for years.  Had taken some Pepcid/famotidine in the past.  Over the past handful of months, however, she has noticed more regurgitation, belching, sometimes difficulty getting a deep breath.  She initially started on pantoprazole 40 mg on an as-needed basis back in the fall.  Recently that was put to a daily basis starting about a month ago.  Then she had Carafate 1 g 4 times daily added about a couple of weeks ago.  So far she has not noticed any improvement in her symptoms.  She does admit to anxiety and has used clonazepam a couple times a week for years.  She does admit to a lot of stressors in her life with family illnesses, etc.  She denies any unintentional weight loss or dysphagia.  She is due for colonoscopy later this year for her 3-year colonoscopy recall and mentioned that she needs an endoscopy possibly trying to do them both at the same time. ? ?While she is here she also mentions that her liver enzymes have been elevated recently.  Looks like ALT peak has been 80 and AST peak is 49 within normal alk phos, total bilirubin and GGT.  Ultrasound showed fatty liver.  FibroSure showed F0 fibrosis.  No definite known family history of liver disease, but she does tell me that her mother had hemochromatosis.  Her hemoglobin is normal, but no iron studies have been checked.  She was checked for hepatitis C a couple years ago and that was negative. ? ? ?Past Medical History:  ?Diagnosis Date  ? Allergy   ? Anxiety   ? GERD (gastroesophageal reflux disease)   ? Osteoporosis   ? ?Past Surgical History:  ?Procedure Laterality Date  ? ABDOMINAL HYSTERECTOMY    ? COLONOSCOPY    ?  ORIF ANKLE FRACTURE Left 07/09/2017  ? Procedure: OPEN REDUCTION INTERNAL FIXATION (ORIF) ANKLE FRACTURE;  Surgeon: Nicholes Stairs, MD;  Location: [facility];  Service: Orthopedics;  Laterality: Left;  90 mins  ? ? reports that she has never smoked. She has never used smokeless tobacco. She reports that she does not drink alcohol and does not use drugs. ?family history includes Heart disease in her father, maternal grandfather, maternal grandmother, mother, paternal grandfather, and paternal grandmother. ?No Known Allergies ? ?  ?Outpatient Encounter Medications as of 01/04/2022  ?Medication Sig  ? calcium carbonate (OS-CAL) 1250 (500 Ca) MG chewable tablet Chew 1 tablet by mouth daily.  ? cholecalciferol (VITAMIN D3) 25 MCG (1000 UNIT) tablet Take 1,000 Units by mouth daily.  ? clonazePAM (KLONOPIN) 0.5 MG tablet Take 1 tablet (0.5 mg total) by mouth 2 (two) times daily as needed for anxiety.  ? Efinaconazole 10 % SOLN Apply topically.  ? hydrocortisone 2.5 % cream Apply topically daily.  ? ketoconazole (NIZORAL) 2 % cream Apply topically daily.  ? Misc Natural Products (FOCUSED MIND PO) Take by mouth. Daily vitamins  ? Multiple Vitamins-Minerals (ONE A DAY WOMEN 50 PLUS PO) Take 1 tablet by mouth daily.  ? nystatin cream (MYCOSTATIN) Apply topically 2 (two) times daily.  ? Omega-3 Fatty Acids (FISH OIL OMEGA-3 PO) Take 1 capsule by mouth daily.  ? pantoprazole (  PROTONIX) 40 MG tablet Take 1 tablet (40 mg total) by mouth daily as needed.  ? polyethylene glycol (MIRALAX / GLYCOLAX) 17 g packet Take 17 g by mouth as needed.  ? PREMARIN vaginal cream PLACE 1 APPLICATORFUL VAGINALLY 2 TIMES A WEEK  ? sucralfate (CARAFATE) 1 g tablet TAKE 1 TABLET(1 GRAM) BY MOUTH FOUR TIMES DAILY AT BEDTIME WITH MEALS  ? vitamin C (ASCORBIC ACID) 500 MG tablet Take 500 mg by mouth daily.  ? ARIPiprazole (ABILIFY) 2 MG tablet Take 1 tablet (2 mg total) by mouth daily. (Patient not taking: Reported on 01/04/2022)  ? ?No  facility-administered encounter medications on file as of 01/04/2022.  ? ? ? ?REVIEW OF SYSTEMS  : All other systems reviewed and negative except where noted in the History of Present Illness. ? ? ?PHYSICAL EXAM: ?BP 130/80 (BP Location: Left Arm, Patient Position: Sitting, Cuff Size: Normal)   Pulse 76   Ht _0  (1.651 m) Comment: height measured without shoes  Wt 182 lb 8 oz (82.8 kg)   BMI 30.37 kg/m?  ?General: Well developed white female in no acute distress ?Head: Normocephalic and atraumatic ?Eyes:  Sclerae anicteric, conjunctiva pink. ?Ears: Normal auditory acuity ?Lungs: Clear throughout to auscultation; no W/R/R. ?Heart: Regular rate and rhythm; no M/R/G. ?Abdomen: Soft, non-distended.  BS present.  Non-tender. ?Musculoskeletal: Symmetrical with no gross deformities  ?Skin: No lesions on visible extremities ?Extremities: No edema  ?Neurological: Alert oriented x 4, grossly non-focal ?Psychological:  Alert and cooperative. Normal mood and affect ? ?ASSESSMENT AND PLAN: ?*GERD and belching: Has had some reflux issues intermittently over the years, but more so over the past few months.  I think a component of her fullness and her belching may be anxiety.  She has only been on the pantoprazole 40 mg daily for about a month or so and Carafate was added to that a couple of weeks ago.  So far she has not noticed an improvement in her symptoms.  She is finding it difficult to take the Carafate 4 times a day and it is not helping so we will have her discontinue that.  I am going to increase her pantoprazole to 40 mg twice daily and add Pepcid 20 mg at bedtime.  Prescriptions sent to pharmacy.  We will have her follow-up in about 6 weeks or so.  If no improvement we will discuss EGD.  She will likely have this performed at some point, but she is due for colonoscopy in the fall and was wanting to make some medication adjustments to see if we can better treat her symptoms and if it would be reasonable to postpone  EGD until that time.  May need to consider doing it sooner if symptoms continue, however. ?*Elevated LFTs: AST and ALT elevation, mildly elevated over the past few months.  Ultrasound shows fatty liver and FibroSure F0 fibrosis.  No definite family history of liver disease to her knowledge, but her mother did have hemochromatosis.  Her hemoglobin is normal, but iron studies have not been checked.  Her liver enzyme elevation is likely due to the fatty liver.  We discussed good diet, exercise, weight loss, good blood sugar and cholesterol control.  We will check the other serologic liver evaluation studies to rule out other causes of chronic liver disease.  Previous hep C was negative so we will not check that again today. ? ? ?CC:  McElwee, Lauren A, NP ? ?  ?

## 2022-01-05 LAB — IGG: IgG (Immunoglobin G), Serum: 857 mg/dL (ref 600–1540)

## 2022-01-09 LAB — HEPATITIS B SURFACE ANTIBODY,QUALITATIVE: Hep B S Ab: NONREACTIVE

## 2022-01-09 LAB — ANA: Anti Nuclear Antibody (ANA): POSITIVE — AB

## 2022-01-09 LAB — ANTI-SMOOTH MUSCLE ANTIBODY, IGG: Actin (Smooth Muscle) Antibody (IGG): <20 U (ref <20)

## 2022-01-09 LAB — HEPATITIS A ANTIBODY, TOTAL: Hepatitis A AB,Total: REACTIVE — AB

## 2022-01-09 LAB — IGA: Immunoglobulin A: 94 mg/dL (ref 70–320)

## 2022-01-09 LAB — ALPHA-1-ANTITRYPSIN: A-1 Antitrypsin, Ser: 114 mg/dL (ref 83–199)

## 2022-01-09 LAB — TISSUE TRANSGLUTAMINASE ABS,IGG,IGA
(tTG) Ab, IgA: <1 U/mL
(tTG) Ab, IgG: 1.3 U/mL

## 2022-01-09 LAB — CERULOPLASMIN: Ceruloplasmin: 26 mg/dL (ref 18–53)

## 2022-01-09 LAB — ANTI-NUCLEAR AB-TITER (ANA TITER): ANA Titer 1: 1:40 {titer} — ABNORMAL HIGH

## 2022-01-09 LAB — MITOCHONDRIAL ANTIBODIES: Mitochondrial M2 Ab, IgG: <=20 U (ref <=20.0)

## 2022-01-09 LAB — HEPATITIS B SURFACE ANTIGEN: Hepatitis B Surface Ag: NONREACTIVE

## 2022-01-31 ENCOUNTER — Ambulatory Visit: Payer: Medicare HMO | Admitting: Family

## 2022-02-06 ENCOUNTER — Ambulatory Visit: Payer: Medicare HMO | Admitting: Podiatry

## 2022-02-06 DIAGNOSIS — M79675 Pain in left toe(s): Secondary | ICD-10-CM | POA: Diagnosis not present

## 2022-02-06 DIAGNOSIS — M79674 Pain in right toe(s): Secondary | ICD-10-CM | POA: Diagnosis not present

## 2022-02-06 DIAGNOSIS — B351 Tinea unguium: Secondary | ICD-10-CM

## 2022-02-06 DIAGNOSIS — L84 Corns and callosities: Secondary | ICD-10-CM

## 2022-02-06 MED ORDER — EFINACONAZOLE 10 % EX SOLN: 1.0000 "application " | Freq: Every day | CUTANEOUS | 2 refills | Status: DC

## 2022-02-07 ENCOUNTER — Ambulatory Visit: Payer: Medicare HMO | Admitting: Nurse Practitioner

## 2022-02-07 NOTE — Progress Notes (Addendum)
Subjective: ?71 year old female presents the office today for concerns of thick, elongated toenails that she can has difficulty trimming herself.  She is still using the topical medication.  Swelling to the ankle that she been having on the left side since the fracture is unchanged.  No new concerns.   ? ?Objective: ?AAO x3, NAD ?DP/PT pulses palpable bilaterally, CRT less than 3 seconds ?Nails are hypertrophic, dystrophic, brittle, discolored, elongated ?10. No surrounding redness or drainage. Tenderness nails 1-5 bilaterally. No open lesions or pre-ulcerative lesions are identified today ?Hyperkeratotic lesions noted left submetatarsal 4, heel.  No underlying ulceration drainage or signs of infection. ?Hardware noted edema present.  Prominent hardware is present on the ankle and there is mild chronic edema present to the ankle no significant pain with range of motion. ?No pain with calf compression, swelling, warmth, erythema ? ?Assessment: ?Symptomatic onychomycosis, hyperkeratotic lesions ? ?Plan: ?-All treatment options discussed with the patient including all alternatives, risks, complications.  ?-Sharply to the nails x10 without any complications or bleeding.  Discussed topical medication.  Continue Jublia. ?-Sharply debrided the mild hyperkeratotic lesion x2 without any complications or bleeding ?-Continue range of motion exercises to the ankle.  The hardware although prominent is not causing any pain.  In the future consider removal if needed but for now and continue to monitor. ?-Patient encouraged to call the office with any questions, concerns, change in symptoms.  ? ?Trula Slade DPM ? ? ?

## 2022-02-13 ENCOUNTER — Telehealth: Payer: Self-pay | Admitting: Nurse Practitioner

## 2022-02-13 MED ORDER — PREMARIN 0.625 MG/GM VA CREA: TOPICAL_CREAM | VAGINAL | 5 refills | Status: DC

## 2022-02-13 NOTE — Telephone Encounter (Signed)
Pt needs premarin cream refilled.

## 2022-02-16 NOTE — Progress Notes (Signed)
Reviewed and agree with documentation and assessment and plan. K. Veena Laketra Bowdish , MD   

## 2022-03-13 ENCOUNTER — Other Ambulatory Visit (HOSPITAL_BASED_OUTPATIENT_CLINIC_OR_DEPARTMENT_OTHER): Payer: Medicare HMO

## 2022-03-14 ENCOUNTER — Ambulatory Visit: Payer: Medicare HMO | Admitting: Gastroenterology

## 2022-03-20 ENCOUNTER — Other Ambulatory Visit (INDEPENDENT_AMBULATORY_CARE_PROVIDER_SITE_OTHER): Payer: Medicare HMO

## 2022-03-20 ENCOUNTER — Other Ambulatory Visit: Payer: Self-pay | Admitting: Nurse Practitioner

## 2022-03-20 ENCOUNTER — Encounter: Payer: Self-pay | Admitting: Nurse Practitioner

## 2022-03-20 DIAGNOSIS — E785 Hyperlipidemia, unspecified: Secondary | ICD-10-CM | POA: Diagnosis not present

## 2022-03-20 DIAGNOSIS — R7989 Other specified abnormal findings of blood chemistry: Secondary | ICD-10-CM

## 2022-03-20 LAB — LIPID PANEL
Cholesterol: 177 mg/dL (ref 0–200)
HDL: 48.4 mg/dL (ref >39.00)
LDL Cholesterol: 109 mg/dL — ABNORMAL HIGH (ref 0–99)
NonHDL: 128.1
Total CHOL/HDL Ratio: 4
Triglycerides: 94 mg/dL (ref 0.0–149.0)
VLDL: 18.8 mg/dL (ref 0.0–40.0)

## 2022-03-20 LAB — COMPREHENSIVE METABOLIC PANEL
ALT: 83 U/L — ABNORMAL HIGH (ref 0–35)
AST: 49 U/L — ABNORMAL HIGH (ref 0–37)
Albumin: 4.6 g/dL (ref 3.5–5.2)
Alkaline Phosphatase: 84 U/L (ref 39–117)
BUN: 17 mg/dL (ref 6–23)
CO2: 22 mEq/L (ref 19–32)
Calcium: 9.2 mg/dL (ref 8.4–10.5)
Chloride: 106 mEq/L (ref 96–112)
Creatinine, Ser: 0.79 mg/dL (ref 0.40–1.20)
GFR: 76.4 mL/min (ref >60.00)
Glucose, Bld: 103 mg/dL — ABNORMAL HIGH (ref 70–99)
Potassium: 3.9 mEq/L (ref 3.5–5.1)
Sodium: 139 mEq/L (ref 135–145)
Total Bilirubin: 0.5 mg/dL (ref 0.2–1.2)
Total Protein: 6.4 g/dL (ref 6.0–8.3)

## 2022-03-21 NOTE — Progress Notes (Signed)
Established Patient Office Visit  Subjective   Patient ID: Lisa Spence, female    DOB: Feb 18, 1951  Age: 71 y.o. MRN: [id number]  Chief Complaint  Patient presents with   Follow-up    3 mo f/u     HPI  Lisa Spence is here to follow-up on elevated cholesterol and LFTs.   Lisa Spence has started working with a Physiological scientist at [employer]. Lisa Spence has not lost any weight yet. Lisa Spence has also been trying to cut back on the amount of cheese that Lisa Spence is eating. Lisa Spence has seen GI about her elevated LFTs and GERD and had further testing completed.   Lisa Spence has been having hot flashes and sweating throughout the day for the past several years. Lisa Spence feels like exercise is helping her. Lisa Spence wears loose clothing and uses a fan at night.   Lisa Spence is still having some anxiety.  Lisa Spence has not started the Abilify.  Lisa Spence is considering starting something soon to help with her worrying. Lisa Spence denies SI/HI.  Past Medical History:  Diagnosis Date   Allergy    Anxiety    GERD (gastroesophageal reflux disease)    Osteoporosis    Past Surgical History:  Procedure Laterality Date   ABDOMINAL HYSTERECTOMY     COLONOSCOPY     ORIF ANKLE FRACTURE Left 07/09/2017   Procedure: OPEN REDUCTION INTERNAL FIXATION (ORIF) ANKLE FRACTURE;  Surgeon: Nicholes Stairs, MD;  Location: [facility];  Service: Orthopedics;  Laterality: Left;  90 mins    ROS See pertinent positives and negatives per HPI.    Objective:     BP 132/90   Pulse 64   Temp 97.6 F (36.4 C) (Temporal)   Wt 183 lb (83 kg)   SpO2 96%   BMI 30.45 kg/m  BP Readings from Last 3 Encounters:  03/22/22 132/90  01/04/22 130/80  12/12/21 136/80   Wt Readings from Last 3 Encounters:  03/22/22 183 lb (83 kg)  01/04/22 182 lb 8 oz (82.8 kg)  12/06/21 176 lb 12.8 oz (80.2 kg)     Physical Exam Vitals and nursing note reviewed.  Constitutional:      General: Lisa Spence is not in acute distress.    Appearance: Normal appearance.  HENT:     Head: Normocephalic.   Eyes:     Conjunctiva/sclera: Conjunctivae normal.  Cardiovascular:     Rate and Rhythm: Normal rate and regular rhythm.     Pulses: Normal pulses.     Heart sounds: Normal heart sounds.  Pulmonary:     Effort: Pulmonary effort is normal.     Breath sounds: Normal breath sounds.  Musculoskeletal:     Cervical back: Normal range of motion.  Skin:    General: Skin is warm.  Neurological:     General: No focal deficit present.     Mental Status: Lisa Spence is alert and oriented to person, place, and time.  Psychiatric:        Mood and Affect: Mood normal.        Behavior: Behavior normal.        Thought Content: Thought content normal.        Judgment: Judgment normal.    The 10-year ASCVD risk score (Arnett DK, et al., 2019) is: 8.9%    Assessment & Plan:   Problem List Items Addressed This Visit       Cardiovascular and Mediastinum   Hot flashes due to menopause    Chronic, ongoing.  Lisa Spence has  not started the Abilify yet, we will change this over to venlafaxine 37.5 mg to also help with her hot flashes and anxiety.  Discussed wearing loose clothing along with using the fan at night.          Other   Anxiety    Chronic, ongoing.  Lisa Spence has not started the Abilify that was prescribed a few months ago.  We will change his over to venlafaxine 37.5 mg to help with anxiety and hot flashes.  Discussed possible side effects.  Follow-up in 3 months.      Relevant Medications   venlafaxine XR (EFFEXOR XR) 37.5 MG 24 hr capsule   clonazePAM (KLONOPIN) 0.5 MG tablet   Elevated LFTs    Liver function is still elevated, has increased slightly since 3 months ago.  Lisa Spence went to GI and had further testing done and was shown that this is most likely due to fatty liver.  Fibrosis score was a 0.  Discussed diet and nutrition.  Follow-up in 3 months.      Hyperlipidemia - Primary    Chronic, improving.  Lisa Spence has changed her diet, decrease the amount Lisa Spence is that Lisa Spence is eating and has started  exercising.  Her total cholesterol and LDL cholesterol have both decreased over the last 3 months.  Congratulated her on this and encouraged her to keep up with these changes.  Follow-up in 3 months.      Other Visit Diagnoses     Postmenopausal estrogen deficiency       We will order DEXA scan   Relevant Orders   DG Bone Density       Return in about 3 months (around 06/22/2022) for elevated LFT, hld.    Charyl Dancer, NP

## 2022-03-22 ENCOUNTER — Ambulatory Visit (INDEPENDENT_AMBULATORY_CARE_PROVIDER_SITE_OTHER): Payer: Medicare HMO | Admitting: Nurse Practitioner

## 2022-03-22 ENCOUNTER — Encounter: Payer: Self-pay | Admitting: Nurse Practitioner

## 2022-03-22 VITALS — BP 132/90 | HR 64 | Temp 97.6°F | Wt 183.0 lb

## 2022-03-22 DIAGNOSIS — E669 Obesity, unspecified: Secondary | ICD-10-CM | POA: Diagnosis not present

## 2022-03-22 DIAGNOSIS — Z780 Asymptomatic menopausal state: Secondary | ICD-10-CM | POA: Diagnosis not present

## 2022-03-22 DIAGNOSIS — R030 Elevated blood-pressure reading, without diagnosis of hypertension: Secondary | ICD-10-CM | POA: Diagnosis not present

## 2022-03-22 DIAGNOSIS — E785 Hyperlipidemia, unspecified: Secondary | ICD-10-CM

## 2022-03-22 DIAGNOSIS — L304 Erythema intertrigo: Secondary | ICD-10-CM | POA: Diagnosis not present

## 2022-03-22 DIAGNOSIS — K219 Gastro-esophageal reflux disease without esophagitis: Secondary | ICD-10-CM | POA: Diagnosis not present

## 2022-03-22 DIAGNOSIS — F419 Anxiety disorder, unspecified: Secondary | ICD-10-CM

## 2022-03-22 DIAGNOSIS — R7989 Other specified abnormal findings of blood chemistry: Secondary | ICD-10-CM | POA: Diagnosis not present

## 2022-03-22 DIAGNOSIS — Z6830 Body mass index (BMI) 30.0-30.9, adult: Secondary | ICD-10-CM | POA: Diagnosis not present

## 2022-03-22 DIAGNOSIS — Z8249 Family history of ischemic heart disease and other diseases of the circulatory system: Secondary | ICD-10-CM | POA: Diagnosis not present

## 2022-03-22 DIAGNOSIS — N951 Menopausal and female climacteric states: Secondary | ICD-10-CM

## 2022-03-22 DIAGNOSIS — N952 Postmenopausal atrophic vaginitis: Secondary | ICD-10-CM | POA: Diagnosis not present

## 2022-03-22 DIAGNOSIS — R69 Illness, unspecified: Secondary | ICD-10-CM | POA: Diagnosis not present

## 2022-03-22 DIAGNOSIS — R232 Flushing: Secondary | ICD-10-CM | POA: Insufficient documentation

## 2022-03-22 MED ORDER — VENLAFAXINE HCL ER 37.5 MG PO CP24: 37.5000 mg | ORAL_CAPSULE | Freq: Every day | ORAL | 1 refills | Status: DC

## 2022-03-22 MED ORDER — CLONAZEPAM 0.5 MG PO TABS: 0.5000 mg | ORAL_TABLET | Freq: Two times a day (BID) | ORAL | 0 refills | Status: DC | PRN

## 2022-03-22 NOTE — Assessment & Plan Note (Signed)
Chronic, ongoing.  She has not started the Abilify that was prescribed a few months ago.  We will change his over to venlafaxine 37.5 mg to help with anxiety and hot flashes.  Discussed possible side effects.  Follow-up in 3 months.

## 2022-03-22 NOTE — Assessment & Plan Note (Signed)
Chronic, ongoing.  She has not started the Abilify yet, we will change this over to venlafaxine 37.5 mg to also help with her hot flashes and anxiety.  Discussed wearing loose clothing along with using the fan at night.

## 2022-03-22 NOTE — Assessment & Plan Note (Signed)
Chronic, improving.  She has changed her diet, decrease the amount she is that she is eating and has started exercising.  Her total cholesterol and LDL cholesterol have both decreased over the last 3 months.  Congratulated her on this and encouraged her to keep up with these changes.  Follow-up in 3 months.

## 2022-03-22 NOTE — Assessment & Plan Note (Signed)
Liver function is still elevated, has increased slightly since 3 months ago.  She went to GI and had further testing done and was shown that this is most likely due to fatty liver.  Fibrosis score was a 0.  Discussed diet and nutrition.  Follow-up in 3 months.

## 2022-03-22 NOTE — Patient Instructions (Signed)
It was great to see you!  Stop abilify (I know you didn't start it) and start venlafaxine 1 capsule daily to help with anxiety and hot flashes. You can also try black kohosh.  I have placed an order for a DEXA scan, they should call you to schedule. If you do not hear from them in the next week, please call:  [facility], [place], [place]   Let's follow-up in 3 months, sooner if you have concerns.  If a referral was placed today, you will be contacted for an appointment. Please note that routine referrals can sometimes take up to 3-4 weeks to process. Please call our office if you haven't heard anything after this time frame.  Take care,  Vance Peper, NP

## 2022-04-17 ENCOUNTER — Ambulatory Visit: Payer: Medicare HMO | Admitting: Gastroenterology

## 2022-04-17 ENCOUNTER — Telehealth: Payer: Self-pay | Admitting: Gastroenterology

## 2022-04-17 DIAGNOSIS — R7989 Other specified abnormal findings of blood chemistry: Secondary | ICD-10-CM

## 2022-04-17 NOTE — Telephone Encounter (Signed)
Dr Silverio Decamp requests that the patient reschedule to a later date rather than doing a virtual visit. She can follow at first available appointment when additional schedule comes out.   Hope she feels better!

## 2022-04-17 NOTE — Telephone Encounter (Signed)
Can you please ask her to come in for LFT in 2 weeks. Thanks

## 2022-04-17 NOTE — Telephone Encounter (Signed)
Good Morning Dr. Silverio Decamp,    Patient called stating she had an appointment with you this afternoon at 3:30 and has picked up a cold and is running a fever. Patient was wanting to know when your next appointment was and I advised her that you were booked up. Patient is wanting to know if she can do a virtual with you this afternoon.   Please advise.   Thank you!

## 2022-04-18 NOTE — Telephone Encounter (Signed)
Made patient aware.

## 2022-04-18 NOTE — Telephone Encounter (Signed)
Patient is wanting to come in on Aug 8 for labs.

## 2022-05-01 ENCOUNTER — Telehealth: Payer: Self-pay | Admitting: Nurse Practitioner

## 2022-05-01 NOTE — Telephone Encounter (Signed)
Called and spoke to pt and scheduled f/u appt. Sw, cma

## 2022-05-01 NOTE — Progress Notes (Unsigned)
   Acute Office Visit  Subjective:     Patient ID: Lisa Spence, female    DOB: Mar 04, 1951, 71 y.o.   MRN: [id number]  No chief complaint on file.   HPI Patient is in today for ***  ROS      Objective:    There were no vitals taken for this visit. {Vitals History (Optional):23777}  Physical Exam  No results found for any visits on 05/02/22.      Assessment & Plan:   Problem List Items Addressed This Visit   None   No orders of the defined types were placed in this encounter.   No follow-ups on file.  Charyl Dancer, NP

## 2022-05-01 NOTE — Telephone Encounter (Signed)
Caller Name: Pt Call back phone #: [phone]  Reason for Call: Pt has had a cough for over a week. She wanted something called in I told her she may need to come in so Lauren can listen to her chest. Please call

## 2022-05-02 ENCOUNTER — Other Ambulatory Visit (INDEPENDENT_AMBULATORY_CARE_PROVIDER_SITE_OTHER): Payer: Medicare HMO

## 2022-05-02 ENCOUNTER — Ambulatory Visit (INDEPENDENT_AMBULATORY_CARE_PROVIDER_SITE_OTHER): Payer: Medicare HMO | Admitting: Nurse Practitioner

## 2022-05-02 ENCOUNTER — Encounter: Payer: Self-pay | Admitting: Nurse Practitioner

## 2022-05-02 VITALS — BP 144/90 | HR 84 | Temp 98.1°F | Wt 178.0 lb

## 2022-05-02 DIAGNOSIS — R69 Illness, unspecified: Secondary | ICD-10-CM | POA: Diagnosis not present

## 2022-05-02 DIAGNOSIS — F419 Anxiety disorder, unspecified: Secondary | ICD-10-CM | POA: Diagnosis not present

## 2022-05-02 DIAGNOSIS — J0110 Acute frontal sinusitis, unspecified: Secondary | ICD-10-CM | POA: Diagnosis not present

## 2022-05-02 DIAGNOSIS — R030 Elevated blood-pressure reading, without diagnosis of hypertension: Secondary | ICD-10-CM | POA: Diagnosis not present

## 2022-05-02 DIAGNOSIS — R7989 Other specified abnormal findings of blood chemistry: Secondary | ICD-10-CM

## 2022-05-02 LAB — HEPATIC FUNCTION PANEL
ALT: 55 U/L — ABNORMAL HIGH (ref 0–35)
AST: 32 U/L (ref 0–37)
Albumin: 4.8 g/dL (ref 3.5–5.2)
Alkaline Phosphatase: 96 U/L (ref 39–117)
Bilirubin, Direct: 0 mg/dL (ref 0.0–0.3)
Total Bilirubin: 0.4 mg/dL (ref 0.2–1.2)
Total Protein: 7.6 g/dL (ref 6.0–8.3)

## 2022-05-02 MED ORDER — BENZONATATE 100 MG PO CAPS: 100.0000 mg | ORAL_CAPSULE | Freq: Three times a day (TID) | ORAL | 0 refills | Status: DC | PRN

## 2022-05-02 MED ORDER — AMOXICILLIN-POT CLAVULANATE 875-125 MG PO TABS: 1.0000 | ORAL_TABLET | Freq: Two times a day (BID) | ORAL | 0 refills | Status: DC

## 2022-05-02 MED ORDER — VENLAFAXINE HCL ER 75 MG PO CP24: 75.0000 mg | ORAL_CAPSULE | Freq: Every day | ORAL | 1 refills | Status: DC

## 2022-05-02 NOTE — Patient Instructions (Signed)
It was great to see you!  Start flonase nasal spray daily and tessalon perles as needed for cough. Start augmentin twice a day for 10 days with food.   Start checking your blood pressure a few times a week and write it down   Let's follow-up if your symptoms worsen or don't improve   Take care,  Vance Peper, NP

## 2022-05-02 NOTE — Assessment & Plan Note (Signed)
Her anxiety has slightly increased since her sister-in-law was diagnosed with a brain tumor.  We will increase her venlafaxine to 75 mg daily.  Keep neck scheduled follow-up appointment.

## 2022-05-02 NOTE — Assessment & Plan Note (Signed)
BP elevated today at 144/90.  Could be due to acute illness.  Encouraged her to start checking her blood pressure a few times a week at home and writing it down.

## 2022-05-08 NOTE — Telephone Encounter (Signed)
error 

## 2022-05-10 ENCOUNTER — Telehealth: Payer: Self-pay | Admitting: Nurse Practitioner

## 2022-05-10 NOTE — Telephone Encounter (Signed)
Left message for patient to call back and schedule Medicare Annual Wellness Visit (AWV).   Please offer to do virtually or by telephone.  Left office number and my jabber [phone].  Last AWV:05/17/2021  Please schedule at anytime with Nurse Health Advisor.

## 2022-05-12 ENCOUNTER — Ambulatory Visit: Payer: Medicare HMO | Admitting: Podiatry

## 2022-05-12 DIAGNOSIS — M79675 Pain in left toe(s): Secondary | ICD-10-CM | POA: Diagnosis not present

## 2022-05-12 DIAGNOSIS — M79674 Pain in right toe(s): Secondary | ICD-10-CM | POA: Diagnosis not present

## 2022-05-12 DIAGNOSIS — L84 Corns and callosities: Secondary | ICD-10-CM | POA: Diagnosis not present

## 2022-05-12 DIAGNOSIS — B351 Tinea unguium: Secondary | ICD-10-CM

## 2022-05-12 NOTE — Progress Notes (Unsigned)
Subjective: 71 year old female presents the office today for concerns of thick, elongated toenails that she can has difficulty trimming herself.  She is still using the topical medication. She did use a medicated pad on the ball of her left foot due to a callus which helped.  No drainage or pus noted. No new concerns.   Objective: AAO x3, NAD DP/PT pulses palpable bilaterally, CRT less than 3 seconds Nails are hypertrophic, dystrophic, brittle, discolored, elongated 10. No surrounding redness or drainage. Tenderness nails 1-5 bilaterally. No open lesions or pre-ulcerative lesions are identified today Hyperkeratotic lesions noted medial hallux bilateral.  No underlying ulceration drainage or signs of infection. Hardware noted edema present.  Prominent hardware is present on the ankle and there is mild chronic edema present to the ankle no significant pain with range of motion. No pain with calf compression, swelling, warmth, erythema  Assessment: Symptomatic onychomycosis, hyperkeratotic lesions  Plan: -All treatment options discussed with the patient including all alternatives, risks, complications.  -Sharply to the nails x10 without any complications or bleeding.  Discussed topical medication.  Continue Jublia. -Sharply debrided the mild hyperkeratotic lesion x2 without any complications or bleeding -Continue range of motion exercises to the ankle.  The hardware although prominent is not causing any pain.  In the future consider removal if needed but for now and continue to monitor. -Patient encouraged to call the office with any questions, concerns, change in symptoms.   Trula Slade DPM

## 2022-05-16 ENCOUNTER — Other Ambulatory Visit: Payer: Self-pay | Admitting: Nurse Practitioner

## 2022-05-16 NOTE — Telephone Encounter (Signed)
Duplicate rx request.

## 2022-06-09 ENCOUNTER — Ambulatory Visit (INDEPENDENT_AMBULATORY_CARE_PROVIDER_SITE_OTHER): Payer: Medicare HMO | Admitting: Family Medicine

## 2022-06-09 ENCOUNTER — Encounter: Payer: Self-pay | Admitting: Family Medicine

## 2022-06-09 VITALS — BP 138/86 | HR 77 | Temp 97.6°F | Wt 173.6 lb

## 2022-06-09 DIAGNOSIS — N3000 Acute cystitis without hematuria: Secondary | ICD-10-CM | POA: Diagnosis not present

## 2022-06-09 DIAGNOSIS — R399 Unspecified symptoms and signs involving the genitourinary system: Secondary | ICD-10-CM | POA: Diagnosis not present

## 2022-06-09 LAB — POCT URINALYSIS DIPSTICK
Bilirubin, UA: NEGATIVE
Blood, UA: NEGATIVE
Glucose, UA: NEGATIVE
Ketones, UA: NEGATIVE
Leukocytes, UA: NEGATIVE
Nitrite, UA: NEGATIVE
Protein, UA: NEGATIVE
Spec Grav, UA: >=1.03 — AB (ref 1.010–1.025)
Urobilinogen, UA: NEGATIVE E.U./dL — AB
pH, UA: 5 (ref 5.0–8.0)

## 2022-06-09 MED ORDER — NITROFURANTOIN MONOHYD MACRO 100 MG PO CAPS: 100.0000 mg | ORAL_CAPSULE | Freq: Two times a day (BID) | ORAL | 0 refills | Status: AC

## 2022-06-09 NOTE — Addendum Note (Signed)
Addended by: Renaee Munda on: 06/09/2022 08:36 AM   Modules accepted: Orders

## 2022-06-09 NOTE — Progress Notes (Signed)
BSJ:[id number], Scheryl Darter, NP Chief Complaint  Patient presents with   uti symptoms    Patient states that she has been sweating, little urination/smell/pressure and lower back discomfort. X 1 week    Current Issues:  Presents with 7 days of  weak urinary stream, some sweats , she says you feel like an infection in her urine, she has had UTIs in the past  Associated symptoms include:  urinary urgency and a new lower back pain  There is no history of of similar symptoms. No new vaginal discharge or pain.  No concern for STI.  Prior to Admission medications   Medication Sig Start Date End Date Taking? Authorizing Provider  calcium carbonate (OS-CAL) 1250 (500 Ca) MG chewable tablet Chew 1 tablet by mouth daily.   Yes [provider]  cholecalciferol (VITAMIN D3) 25 MCG (1000 UNIT) tablet Take 1,000 Units by mouth daily.   Yes [provider]  clonazePAM (KLONOPIN) 0.5 MG tablet Take 1 tablet (0.5 mg total) by mouth 2 (two) times daily as needed for anxiety. 03/22/22  Yes McElwee, Lauren A, NP  conjugated estrogens (PREMARIN) vaginal cream PLACE 1 APPLICATORFUL VAGINALLY 2 TIMES A WEEK 02/13/22  Yes McElwee, Lauren A, NP  Efinaconazole 10 % SOLN Apply 1 application. topically daily. 02/06/22  Yes Trula Slade, DPM  famotidine (PEPCID) 20 MG tablet Take 1 tablet (20 mg total) by mouth at bedtime. 01/04/22  Yes Zehr, Laban Emperor, PA-C  hydrocortisone 2.5 % cream Apply topically daily. 10/06/21  Yes [provider]  ketoconazole (NIZORAL) 2 % cream Apply topically daily. 10/06/21  Yes [provider]  Misc Natural Products (FOCUSED MIND PO) Take by mouth. Daily vitamins   Yes [provider]  Multiple Vitamins-Minerals (ONE A DAY WOMEN 50 PLUS PO) Take 1 tablet by mouth daily.   Yes [provider]  nystatin cream (MYCOSTATIN) Apply topically 2 (two) times daily. 09/27/21  Yes [provider]  Omega-3 Fatty Acids (FISH OIL OMEGA-3 PO) Take  1 capsule by mouth daily.   Yes [provider]  pantoprazole (PROTONIX) 40 MG tablet Take 1 tablet (40 mg total) by mouth 2 (two) times daily before a meal. 01/04/22  Yes Zehr, Janett Billow D, PA-C  polyethylene glycol (MIRALAX / GLYCOLAX) 17 g packet Take 17 g by mouth as needed.   Yes [provider]  venlafaxine XR (EFFEXOR XR) 75 MG 24 hr capsule Take 1 capsule (75 mg total) by mouth daily with breakfast. 05/02/22  Yes McElwee, Lauren A, NP  vitamin C (ASCORBIC ACID) 500 MG tablet Take 500 mg by mouth daily.   Yes [provider]  amoxicillin-clavulanate (AUGMENTIN) 875-125 MG tablet Take 1 tablet by mouth 2 (two) times daily. Patient not taking: Reported on 06/09/2022 05/02/22   Charyl Dancer, NP  benzonatate (TESSALON) 100 MG capsule Take 1 capsule (100 mg total) by mouth 3 (three) times daily as needed for cough. Patient not taking: Reported on 06/09/2022 05/02/22   Charyl Dancer, NP    Review of Systems:Remainder of ROS negative,   PE:  BP 138/86 (BP Location: Left Arm, Patient Position: Sitting, Cuff Size: Large)   Pulse 77   Temp 97.6 F (36.4 C) (Temporal)   Wt 173 lb 9.6 oz (78.7 kg)   SpO2 98%   BMI 28.89 kg/m   Gen: NAD, resting comfortably CV: RRR with no murmurs appreciated Pulm: NWOB, CTAB with no crackles, wheezes, or rhonchi GI: Normal bowel sounds present. Soft, Nontender,  Nondistended. MSK: no edema, cyanosis, or clubbing noted Skin: warm, dry Neuro: grossly normal, moves all extremities Psych: Normal affect and thought content   Results for orders placed or performed in visit on 05/02/22  Hepatic function panel  Result Value Ref Range   Total Bilirubin 0.4 0.2 - 1.2 mg/dL   Bilirubin, Direct 0.0 0.0 - 0.3 mg/dL   Alkaline Phosphatase 96 39 - 117 U/L   AST 32 0 - 37 U/L   ALT 55 (H) 0 - 35 U/L   Total Protein 7.6 6.0 - 8.3 g/dL   Albumin 4.8 3.5 - 5.2 g/dL    Assessment and Plan:  1. UTI symptoms  - POCT Urinalysis Dipstick -  Ucx - Macrobid 100 mg bid

## 2022-06-09 NOTE — Patient Instructions (Signed)
Urinary Tract Infection, Adult  A urinary tract infection (UTI) is an infection of any part of the urinary tract. The urinary tract includes the kidneys, ureters, bladder, and urethra. These organs make, store, and get rid of urine in the body. An upper UTI affects the ureters and kidneys. A lower UTI affects the bladder and urethra. What are the causes? Most urinary tract infections are caused by bacteria in your genital area around your urethra, where urine leaves your body. These bacteria grow and cause inflammation of your urinary tract. What increases the risk? You are more likely to develop this condition if: You have a urinary catheter that stays in place. You are not able to control when you urinate or have a bowel movement (incontinence). You are female and you: Use a spermicide or diaphragm for birth control. Have low estrogen levels. Are pregnant. You have certain genes that increase your risk. You are sexually active. You take antibiotic medicines. You have a condition that causes your flow of urine to slow down, such as: An enlarged prostate, if you are female. Blockage in your urethra. A kidney stone. A nerve condition that affects your bladder control (neurogenic bladder). Not getting enough to drink, or not urinating often. You have certain medical conditions, such as: Diabetes. A weak disease-fighting system (immunesystem). Sickle cell disease. Gout. Spinal cord injury. What are the signs or symptoms? Symptoms of this condition include: Needing to urinate right away (urgency). Frequent urination. This may include small amounts of urine each time you urinate. Pain or burning with urination. Blood in the urine. Urine that smells bad or unusual. Trouble urinating. Cloudy urine. Vaginal discharge, if you are female. Pain in the abdomen or the lower back. You may also have: Vomiting or a decreased appetite. Confusion. Irritability or tiredness. A fever or  chills. Diarrhea. The first symptom in older adults may be confusion. In some cases, they may not have any symptoms until the infection has worsened. How is this diagnosed? This condition is diagnosed based on your medical history and a physical exam. You may also have other tests, including: Urine tests. Blood tests. Tests for STIs (sexually transmitted infections). If you have had more than one UTI, a cystoscopy or imaging studies may be done to determine the cause of the infections. How is this treated? Treatment for this condition includes: Antibiotic medicine. Over-the-counter medicines to treat discomfort. Drinking enough water to stay hydrated. If you have frequent infections or have other conditions such as a kidney stone, you may need to see a health care provider who specializes in the urinary tract (urologist). In rare cases, urinary tract infections can cause sepsis. Sepsis is a life-threatening condition that occurs when the body responds to an infection. Sepsis is treated in the hospital with IV antibiotics, fluids, and other medicines. Follow these instructions at home:  Medicines Take over-the-counter and prescription medicines only as told by your health care provider. If you were prescribed an antibiotic medicine, take it as told by your health care provider. Do not stop using the antibiotic even if you start to feel better. General instructions Make sure you: Empty your bladder often and completely. Do not hold urine for long periods of time. Empty your bladder after sex. Wipe from front to back after urinating or having a bowel movement if you are female. Use each tissue only one time when you wipe. Drink enough fluid to keep your urine pale yellow. Keep all follow-up visits. This is important. Contact a health   care provider if: Your symptoms do not get better after 1-2 days. Your symptoms go away and then return. Get help right away if: You have severe pain in  your back or your lower abdomen. You have a fever or chills. You have nausea or vomiting. Summary A urinary tract infection (UTI) is an infection of any part of the urinary tract, which includes the kidneys, ureters, bladder, and urethra. Most urinary tract infections are caused by bacteria in your genital area. Treatment for this condition often includes antibiotic medicines. If you were prescribed an antibiotic medicine, take it as told by your health care provider. Do not stop using the antibiotic even if you start to feel better. Keep all follow-up visits. This is important. This information is not intended to replace advice given to you by your health care provider. Make sure you discuss any questions you have with your health care provider. Document Revised: 04/23/2020 Document Reviewed: 04/23/2020 Elsevier Patient Education  2023 Elsevier Inc.  

## 2022-06-09 NOTE — Addendum Note (Signed)
Addended by: Bonnita Hollow on: 06/09/2022 08:41 AM   Modules accepted: Orders

## 2022-06-10 LAB — URINE CULTURE
MICRO NUMBER:: 13923925
Result:: NO GROWTH
SPECIMEN QUALITY:: ADEQUATE

## 2022-06-12 ENCOUNTER — Encounter: Payer: Self-pay | Admitting: Family Medicine

## 2022-06-20 ENCOUNTER — Ambulatory Visit (INDEPENDENT_AMBULATORY_CARE_PROVIDER_SITE_OTHER): Payer: Medicare HMO

## 2022-06-20 VITALS — Ht 66.0 in | Wt 170.0 lb

## 2022-06-20 DIAGNOSIS — Z0000 Encounter for general adult medical examination without abnormal findings: Secondary | ICD-10-CM

## 2022-06-20 NOTE — Progress Notes (Signed)
Subjective:   Lisa Spence is a 71 y.o. female who presents for Medicare Annual (Subsequent) preventive examination.   Virtual Visit via Telephone Note  I connected with  Lisa Spence on 06/20/22 at  9:00 AM EDT by telephone and verified that I am speaking with the correct person using two identifiers.  Location: Patient: home  Provider: [facility]  Persons participating in the virtual visit: patient/Nurse Health Advisor   I discussed the limitations, risks, security and privacy concerns of performing an evaluation and management service by telephone and the availability of in person appointments. The patient expressed understanding and agreed to proceed.  Interactive audio and video telecommunications were attempted between this nurse and patient, however failed, due to patient having technical difficulties OR patient did not have access to video capability.  We continued and completed visit with audio only.  Some vital signs may be absent or patient reported.   Daphane Shepherd, LPN  Review of Systems     Cardiac Risk Factors include: advanced age (>48mn, >>44women)     Objective:    Today's Vitals   06/20/22 0903  Weight: 170 lb (77.1 kg)  Height: '5\' 6"'$  (1.676 m)   Body mass index is 27.44 kg/m.     06/20/2022    9:14 AM 05/17/2021   12:18 PM 07/11/2017    8:00 AM 07/05/2017   10:09 AM 06/23/2017   10:44 PM  Advanced Directives  Does Patient Have a Medical Advance Directive? Yes No No No No  Type of [place] will      [place]? No - copy requested      Would patient like information on creating a medical advance directive?  No - Patient declined No - Patient declined No - Patient declined     Current Medications (verified) Outpatient Encounter Medications as of 06/20/2022  Medication Sig   calcium carbonate (OS-CAL) 1250 (500 Ca) MG chewable tablet Chew 1 tablet by mouth daily.    cholecalciferol (VITAMIN D3) 25 MCG (1000 UNIT) tablet Take 1,000 Units by mouth daily.   clonazePAM (KLONOPIN) 0.5 MG tablet Take 1 tablet (0.5 mg total) by mouth 2 (two) times daily as needed for anxiety.   conjugated estrogens (PREMARIN) vaginal cream PLACE 1 APPLICATORFUL VAGINALLY 2 TIMES A WEEK   Efinaconazole 10 % SOLN Apply 1 application. topically daily.   famotidine (PEPCID) 20 MG tablet Take 1 tablet (20 mg total) by mouth at bedtime.   hydrocortisone 2.5 % cream Apply topically daily.   ketoconazole (NIZORAL) 2 % cream Apply topically daily.   Multiple Vitamins-Minerals (ONE A DAY WOMEN 50 PLUS PO) Take 1 tablet by mouth daily.   nystatin cream (MYCOSTATIN) Apply topically 2 (two) times daily.   Omega-3 Fatty Acids (FISH OIL OMEGA-3 PO) Take 1 capsule by mouth daily.   pantoprazole (PROTONIX) 40 MG tablet Take 1 tablet (40 mg total) by mouth 2 (two) times daily before a meal.   polyethylene glycol (MIRALAX / GLYCOLAX) 17 g packet Take 17 g by mouth as needed.   venlafaxine XR (EFFEXOR XR) 75 MG 24 hr capsule Take 1 capsule (75 mg total) by mouth daily with breakfast.   amoxicillin-clavulanate (AUGMENTIN) 875-125 MG tablet Take 1 tablet by mouth 2 (two) times daily. (Patient not taking: Reported on 06/09/2022)   benzonatate (TESSALON) 100 MG capsule Take 1 capsule (100 mg total) by mouth 3 (three) times daily as needed for  cough. (Patient not taking: Reported on 06/09/2022)   Misc Natural Products (FOCUSED MIND PO) Take by mouth. Daily vitamins (Patient not taking: Reported on 06/20/2022)   vitamin C (ASCORBIC ACID) 500 MG tablet Take 500 mg by mouth daily. (Patient not taking: Reported on 06/20/2022)   No facility-administered encounter medications on file as of 06/20/2022.    Allergies (verified) Patient has no known allergies.   History: Past Medical History:  Diagnosis Date   Allergy    Anxiety    GERD (gastroesophageal reflux disease)    Osteoporosis    Past Surgical  History:  Procedure Laterality Date   ABDOMINAL HYSTERECTOMY     COLONOSCOPY     ORIF ANKLE FRACTURE Left 07/09/2017   Procedure: OPEN REDUCTION INTERNAL FIXATION (ORIF) ANKLE FRACTURE;  Surgeon: Nicholes Stairs, MD;  Location: [facility];  Service: Orthopedics;  Laterality: Left;  90 mins   Family History  Problem Relation Age of Onset   Heart disease Mother    Heart disease Father    Heart disease Maternal Grandmother    Heart disease Maternal Grandfather    Heart disease Paternal Grandmother    Heart disease Paternal Grandfather    Colon cancer Neg Hx    Esophageal cancer Neg Hx    Rectal cancer Neg Hx    Stomach cancer Neg Hx    Social History   Socioeconomic History   Marital status: Single    Spouse name: Not on file   Number of children: Not on file   Years of education: Not on file   Highest education level: Not on file  Occupational History   Not on file  Tobacco Use   Smoking status: Never   Smokeless tobacco: Never  Vaping Use   Vaping Use: Never used  Substance and Sexual Activity   Alcohol use: No    Comment: no longer drinks, drank 6 glasses wine/week stopped in 2007   Drug use: No   Sexual activity: Not Currently  Other Topics Concern   Not on file  Social History Narrative   Not on file   Social Determinants of Health   Financial Resource Strain: Low Risk  (06/20/2022)   Overall Financial Resource Strain (CARDIA)    Difficulty of Paying Living Expenses: Not hard at all  Food Insecurity: No Food Insecurity (06/20/2022)   Hunger Vital Sign    Worried About Running Out of Food in the Last Year: Never true    Ran Out of Food in the Last Year: Never true  Transportation Needs: No Transportation Needs (06/20/2022)   PRAPARE - Hydrologist (Medical): No    Lack of Transportation (Non-Medical): No  Physical Activity: Sufficiently Active (06/20/2022)   Exercise Vital Sign    Days of Exercise per Week: 4 days    Minutes of  Exercise per Session: 60 min  Stress: No Stress Concern Present (06/20/2022)   [place]    Feeling of Stress : Not at all  Social Connections: [place] (06/20/2022)   Social Connection and Isolation Panel [NHANES]    Frequency of Communication with Friends and Family: More than three times a week    Frequency of Social Gatherings with Friends and Family: More than three times a week    Attends Religious Services: More than 4 times per year    Active Member of [employer] or Organizations: Yes    Attends Archivist Meetings: More than  4 times per year    Marital Status: Married    Tobacco Counseling Counseling given: Not Answered   Clinical Intake:  Pre-visit preparation completed: Yes  Pain : No/denies pain     Nutritional Risks: None Diabetes: No  How often do you need to have someone help you when you read instructions, pamphlets, or other written materials from your doctor or pharmacy?: 1 - Never  Diabetic?no   Interpreter Needed?: No  Information entered by :: Jadene Pierini, LPN   Activities of Daily Living    06/20/2022    9:15 AM 06/20/2022    8:37 AM  In your present state of health, do you have any difficulty performing the following activities:  Hearing? 0 0  Vision? 0 0  Difficulty concentrating or making decisions? 0 0  Walking or climbing stairs? 0 0  Dressing or bathing? 0 0  Doing errands, shopping? 0 0  Preparing Food and eating ? N N  Using the Toilet? N N  In the past six months, have you accidently leaked urine? N N  Do you have problems with loss of bowel control? N N  Managing your Medications? N N  Managing your Finances? N N  Housekeeping or managing your Housekeeping? N N    Patient Care Team: Charyl Dancer, NP as PCP - General (Internal Medicine)  Indicate any recent Medical Services you may have received from other than [facility] providers in the past  year (date may be approximate).     Assessment:   This is a routine wellness examination for [place].  Hearing/Vision screen Vision Screening - Comments:: Annual eye exam wear glasses   Dietary issues and exercise activities discussed: Current Exercise Habits: Home exercise routine, Type of exercise: walking;strength training/weights, Time (Minutes): 60, Frequency (Times/Week): 4, Weekly Exercise (Minutes/Week): 240, Intensity: Mild, Exercise limited by: None identified   Goals Addressed             This Visit's Progress    Exercise 3x per week (30 min per time)         Depression Screen    06/20/2022    9:11 AM 06/20/2022    9:10 AM 12/06/2021    9:22 AM 11/08/2021    9:57 AM 05/27/2021   11:07 AM 05/27/2021   10:09 AM 05/17/2021   12:18 PM  PHQ 2/9 Scores  PHQ - 2 Score 2 0 0 0 0 0 0  PHQ- 9 Score   0 5 2      Fall Risk    06/20/2022    9:05 AM 06/20/2022    8:37 AM 05/27/2021   10:09 AM 05/17/2021   10:45 AM 10/27/2020    8:35 AM  Fall Risk   Falls in the past year? 0 0 0 0 0  Number falls in past yr: 0   0 0  Injury with Fall? 0   0 0  Risk for fall due to : No Fall Risks      Follow up Falls prevention discussed   Falls evaluation completed;Falls prevention discussed     FALL RISK PREVENTION PERTAINING TO THE HOME:  Any stairs in or around the home? Yes  If so, are there any without handrails? No  Home free of loose throw rugs in walkways, pet beds, electrical cords, etc? Yes  Adequate lighting in your home to reduce risk of falls? Yes   ASSISTIVE DEVICES UTILIZED TO PREVENT FALLS:  Life alert? No  Use of a cane, walker  or w/c? No  Grab bars in the bathroom? Yes  Shower chair or bench in shower? No  Elevated toilet seat or a handicapped toilet? No          06/20/2022    9:16 AM 05/17/2021   11:03 AM  6CIT Screen  What Year? 0 points 0 points  What month? 0 points 0 points  What time? 0 points 0 points  Count back from 20 0 points 0 points  Months in  reverse 0 points 0 points  Repeat phrase 0 points 0 points  Total Score 0 points 0 points    Immunizations Immunization History  Administered Date(s) Administered   Fluad Quad(high Dose 65+) 07/23/2019, 08/11/2020, 09/01/2021   PFIZER Comirnaty(Gray Top)Covid-19 Tri-Sucrose Vaccine 12/25/2019, 01/23/2020, 10/12/2020   Pneumococcal Conjugate-13 07/23/2019   Pneumococcal Polysaccharide-23 08/11/2020   Tdap 11/08/2021    TDAP status: Up to date  Flu Vaccine status: Due, Education has been provided regarding the importance of this vaccine. Advised may receive this vaccine at local pharmacy or Health Dept. Aware to provide a copy of the vaccination record if obtained from local pharmacy or Health Dept. Verbalized acceptance and understanding.  Pneumococcal vaccine status: Up to date  Covid-19 vaccine status: Completed vaccines  Qualifies for Shingles Vaccine? Yes   Zostavax completed No   Shingrix Completed?: No.    Education has been provided regarding the importance of this vaccine. Patient has been advised to call insurance company to determine out of pocket expense if they have not yet received this vaccine. Advised may also receive vaccine at local pharmacy or Health Dept. Verbalized acceptance and understanding.  Screening Tests Health Maintenance  Topic Date Due   INFLUENZA VACCINE  04/25/2022   MAMMOGRAM  06/01/2022   COLONOSCOPY (Pts 45-87yr Insurance coverage will need to be confirmed)  06/16/2022   Zoster Vaccines- Shingrix (1 of 2) 06/22/2022 (Originally 12/13/2002)   TETANUS/TDAP  11/09/2031   Pneumonia Vaccine 69 Years old  Completed   DEXA SCAN  Completed   Hepatitis C Screening  Completed   HPV VACCINES  Aged Out   COVID-19 Vaccine  Discontinued    Health Maintenance  Health Maintenance Due  Topic Date Due   INFLUENZA VACCINE  04/25/2022   MAMMOGRAM  06/01/2022   COLONOSCOPY (Pts 45-461yrInsurance coverage will need to be confirmed)  06/16/2022     Colorectal cancer screening: Referral to GI placed Scheduled 06/21/2022 Gi . Pt aware the office will call re: appt.  Mammogram status: Ordered patient to call and schedule. Pt provided with contact info and advised to call to schedule appt.   Bone Density status: Completed 10/19/2022. Results reflect: Bone density results: OSTEOPENIA. Repeat every 5 years.  Lung Cancer Screening: (Low Dose CT Chest recommended if Age 69-80ears, 30 pack-year currently smoking OR have quit w/in 15years.) does not qualify.   Lung Cancer Screening Referral: n/a  Additional Screening:  Hepatitis C Screening: does not qualify; Completed 03/28/2019  Vision Screening: Recommended annual ophthalmology exams for early detection of glaucoma and other disorders of the eye. Is the patient up to date with their annual eye exam?  Yes  Who is the provider or what is the name of the office in which the patient attends annual eye exams? Dr.Miller  If pt is not established with a provider, would they like to be referred to a provider to establish care? No .   Dental Screening: Recommended annual dental exams for proper oral hygiene  Community Resource Referral /  Chronic Care Management: CRR required this visit?  No   CCM required this visit?  No      Plan:     I have personally reviewed and noted the following in the patient's chart:   Medical and social history Use of alcohol, tobacco or illicit drugs  Current medications and supplements including opioid prescriptions. Patient is not currently taking opioid prescriptions. Functional ability and status Nutritional status Physical activity Advanced directives List of other physicians Hospitalizations, surgeries, and ER visits in previous 12 months Vitals Screenings to include cognitive, depression, and falls Referrals and appointments  In addition, I have reviewed and discussed with patient certain preventive protocols, quality metrics, and best  practice recommendations. A written personalized care plan for preventive services as well as general preventive health recommendations were provided to patient.     Daphane Shepherd, LPN   7/34/2876   Nurse Notes: Due Flu vaccine

## 2022-06-20 NOTE — Patient Instructions (Signed)
Lisa Spence , Thank you for taking time to come for your Medicare Wellness Visit. I appreciate your ongoing commitment to your health goals. Please review the following plan we discussed and let me know if I can assist you in the future.   These are the goals we discussed:  Goals      Exercise 3x per week (30 min per time)     Patient Stated     Focus on overall health and wellness        This is a list of the screening recommended for you and due dates:  Health Maintenance  Topic Date Due   Flu Shot  04/25/2022   Mammogram  06/01/2022   Colon Cancer Screening  06/16/2022   Zoster (Shingles) Vaccine (1 of 2) 06/22/2022*   Tetanus Vaccine  11/09/2031   Pneumonia Vaccine  Completed   DEXA scan (bone density measurement)  Completed   Hepatitis C Screening: USPSTF Recommendation to screen - Ages 3-79 yo.  Completed   HPV Vaccine  Aged Out   COVID-19 Vaccine  Discontinued  *Topic was postponed. The date shown is not the original due date.    Advanced directives: Please bring a copy of your health care power of attorney and living will to the office to be added to your chart at your convenience.   Conditions/risks identified: Aim for 30 minutes of exercise or brisk walking, 6-8 glasses of water, and 5 servings of fruits and vegetables each day.   Next appointment: Follow up in one year for your annual wellness visit    Preventive Care 65 Years and Older, Female Preventive care refers to lifestyle choices and visits with your health care provider that can promote health and wellness. What does preventive care include? A yearly physical exam. This is also called an annual well check. Dental exams once or twice a year. Routine eye exams. Ask your health care provider how often you should have your eyes checked. Personal lifestyle choices, including: Daily care of your teeth and gums. Regular physical activity. Eating a healthy diet. Avoiding tobacco and drug use. Limiting  alcohol use. Practicing safe sex. Taking low-dose aspirin every day. Taking vitamin and mineral supplements as recommended by your health care provider. What happens during an annual well check? The services and screenings done by your health care provider during your annual well check will depend on your age, overall health, lifestyle risk factors, and family history of disease. Counseling  Your health care provider may ask you questions about your: Alcohol use. Tobacco use. Drug use. Emotional well-being. Home and relationship well-being. Sexual activity. Eating habits. History of falls. Memory and ability to understand (cognition). Work and work Statistician. Reproductive health. Screening  You may have the following tests or measurements: Height, weight, and BMI. Blood pressure. Lipid and cholesterol levels. These may be checked every 5 years, or more frequently if you are over 80 years old. Skin check. Lung cancer screening. You may have this screening every year starting at age 68 if you have a 30-pack-year history of smoking and currently smoke or have quit within the past 15 years. Fecal occult blood test (FOBT) of the stool. You may have this test every year starting at age 56. Flexible sigmoidoscopy or colonoscopy. You may have a sigmoidoscopy every 5 years or a colonoscopy every 10 years starting at age 84. Hepatitis C blood test. Hepatitis B blood test. Sexually transmitted disease (STD) testing. Diabetes screening. This is done by checking your blood  sugar (glucose) after you have not eaten for a while (fasting). You may have this done every 1-3 years. Bone density scan. This is done to screen for osteoporosis. You may have this done starting at age 3. Mammogram. This may be done every 1-2 years. Talk to your health care provider about how often you should have regular mammograms. Talk with your health care provider about your test results, treatment options, and if  necessary, the need for more tests. Vaccines  Your health care provider may recommend certain vaccines, such as: Influenza vaccine. This is recommended every year. Tetanus, diphtheria, and acellular pertussis (Tdap, Td) vaccine. You may need a Td booster every 10 years. Zoster vaccine. You may need this after age 58. Pneumococcal 13-valent conjugate (PCV13) vaccine. One dose is recommended after age 56. Pneumococcal polysaccharide (PPSV23) vaccine. One dose is recommended after age 26. Talk to your health care provider about which screenings and vaccines you need and how often you need them. This information is not intended to replace advice given to you by your health care provider. Make sure you discuss any questions you have with your health care provider. Document Released: 10/08/2015 Document Revised: 05/31/2016 Document Reviewed: 07/13/2015 Elsevier Interactive Patient Education  2017 [place] Prevention in the Home Falls can cause injuries. They can happen to people of all ages. There are many things you can do to make your home safe and to help prevent falls. What can I do on the outside of my home? Regularly fix the edges of walkways and driveways and fix any cracks. Remove anything that might make you trip as you walk through a door, such as a raised step or threshold. Trim any bushes or trees on the path to your home. Use bright outdoor lighting. Clear any walking paths of anything that might make someone trip, such as rocks or tools. Regularly check to see if handrails are loose or broken. Make sure that both sides of any steps have handrails. Any raised decks and porches should have guardrails on the edges. Have any leaves, snow, or ice cleared regularly. Use sand or salt on walking paths during winter. Clean up any spills in your garage right away. This includes oil or grease spills. What can I do in the bathroom? Use night lights. Install grab bars by the toilet  and in the tub and shower. Do not use towel bars as grab bars. Use non-skid mats or decals in the tub or shower. If you need to sit down in the shower, use a plastic, non-slip stool. Keep the floor dry. Clean up any water that spills on the floor as soon as it happens. Remove soap buildup in the tub or shower regularly. Attach bath mats securely with double-sided non-slip rug tape. Do not have throw rugs and other things on the floor that can make you trip. What can I do in the bedroom? Use night lights. Make sure that you have a light by your bed that is easy to reach. Do not use any sheets or blankets that are too big for your bed. They should not hang down onto the floor. Have a firm chair that has side arms. You can use this for support while you get dressed. Do not have throw rugs and other things on the floor that can make you trip. What can I do in the kitchen? Clean up any spills right away. Avoid walking on wet floors. Keep items that you use a lot in easy-to-reach  places. If you need to reach something above you, use a strong step stool that has a grab bar. Keep electrical cords out of the way. Do not use floor polish or wax that makes floors slippery. If you must use wax, use non-skid floor wax. Do not have throw rugs and other things on the floor that can make you trip. What can I do with my stairs? Do not leave any items on the stairs. Make sure that there are handrails on both sides of the stairs and use them. Fix handrails that are broken or loose. Make sure that handrails are as long as the stairways. Check any carpeting to make sure that it is firmly attached to the stairs. Fix any carpet that is loose or worn. Avoid having throw rugs at the top or bottom of the stairs. If you do have throw rugs, attach them to the floor with carpet tape. Make sure that you have a light switch at the top of the stairs and the bottom of the stairs. If you do not have them, ask someone to add  them for you. What else can I do to help prevent falls? Wear shoes that: Do not have high heels. Have rubber bottoms. Are comfortable and fit you well. Are closed at the toe. Do not wear sandals. If you use a stepladder: Make sure that it is fully opened. Do not climb a closed stepladder. Make sure that both sides of the stepladder are locked into place. Ask someone to hold it for you, if possible. Clearly mark and make sure that you can see: Any grab bars or handrails. First and last steps. Where the edge of each step is. Use tools that help you move around (mobility aids) if they are needed. These include: Canes. Walkers. Scooters. Crutches. Turn on the lights when you go into a dark area. Replace any light bulbs as soon as they burn out. Set up your furniture so you have a clear path. Avoid moving your furniture around. If any of your floors are uneven, fix them. If there are any pets around you, be aware of where they are. Review your medicines with your doctor. Some medicines can make you feel dizzy. This can increase your chance of falling. Ask your doctor what other things that you can do to help prevent falls. This information is not intended to replace advice given to you by your health care provider. Make sure you discuss any questions you have with your health care provider. Document Released: 07/08/2009 Document Revised: 02/17/2016 Document Reviewed: 10/16/2014 Elsevier Interactive Patient Education  2017 [employer].

## 2022-06-21 ENCOUNTER — Other Ambulatory Visit (INDEPENDENT_AMBULATORY_CARE_PROVIDER_SITE_OTHER): Payer: Medicare HMO

## 2022-06-21 ENCOUNTER — Ambulatory Visit: Payer: Medicare HMO | Admitting: Gastroenterology

## 2022-06-21 ENCOUNTER — Encounter: Payer: Self-pay | Admitting: Gastroenterology

## 2022-06-21 VITALS — BP 138/78 | HR 76 | Wt 175.0 lb

## 2022-06-21 DIAGNOSIS — R7989 Other specified abnormal findings of blood chemistry: Secondary | ICD-10-CM

## 2022-06-21 DIAGNOSIS — Z860100 Personal history of colon polyps, unspecified: Secondary | ICD-10-CM

## 2022-06-21 DIAGNOSIS — E739 Lactose intolerance, unspecified: Secondary | ICD-10-CM | POA: Diagnosis not present

## 2022-06-21 DIAGNOSIS — K760 Fatty (change of) liver, not elsewhere classified: Secondary | ICD-10-CM | POA: Diagnosis not present

## 2022-06-21 DIAGNOSIS — K219 Gastro-esophageal reflux disease without esophagitis: Secondary | ICD-10-CM

## 2022-06-21 LAB — HEPATIC FUNCTION PANEL
ALT: 55 U/L — ABNORMAL HIGH (ref 0–35)
AST: 33 U/L (ref 0–37)
Albumin: 4.7 g/dL (ref 3.5–5.2)
Alkaline Phosphatase: 94 U/L (ref 39–117)
Bilirubin, Direct: 0.1 mg/dL (ref 0.0–0.3)
Total Bilirubin: 0.4 mg/dL (ref 0.2–1.2)
Total Protein: 7.5 g/dL (ref 6.0–8.3)

## 2022-06-21 MED ORDER — PANTOPRAZOLE SODIUM 40 MG PO TBEC: 40.0000 mg | DELAYED_RELEASE_TABLET | Freq: Every day | ORAL | 3 refills | Status: DC

## 2022-06-21 MED ORDER — NA SULFATE-K SULFATE-MG SULF 17.5-3.13-1.6 GM/177ML PO SOLN: 1.0000 | Freq: Once | ORAL | 0 refills | Status: AC

## 2022-06-21 NOTE — Patient Instructions (Addendum)
Your provider has requested that you go to the basement level for lab work before leaving today. Press "B" on the elevator. The lab is located at the first door on the left as you exit the elevator.  You will also come back to our lab in 4 weeks and have repeat labs. You do not need an appt for this. They will have your order.  You will be contacted by [facility] in the next 7 days to arrange a Ultrasound elastography.  The number on your caller ID will be [phone], please answer when they call.  If you have not heard from them in 7 days please call [phone] to schedule.    Avoid sugar.   We have sent the following medications to your pharmacy for you to pick up at your convenience: pantoprazole 40 mg daily.   Continue pepcid at bedtime as needed.   Please start a lactose free diet x 1-2 weeks.   You have been scheduled for an endoscopy and colonoscopy. Please follow the written instructions given to you at your visit today. Please pick up your prep supplies at the pharmacy within the next 1-3 days. If you use inhalers (even only as needed), please bring them with you on the day of your procedure.  The [facility] providers would like to encourage you to use [facility] to communicate with providers for non-urgent requests or questions.  Due to long hold times on the telephone, sending your provider a message by [facility] may be a faster and more efficient way to get a response.  Please allow 48 business hours for a response.  Please remember that this is for non-urgent requests.   Due to recent changes in healthcare laws, you may see the results of your imaging and laboratory studies on MyChart before your provider has had a chance to review them.  We understand that in some cases there may be results that are confusing or concerning to you. Not all laboratory results come back in the same time frame and the provider may be waiting for multiple results in order to interpret  others.  Please give [place] 48 hours in order for your provider to thoroughly review all the results before contacting the office for clarification of your results.   Lactose-Free Diet, Adult If you have lactose intolerance, you are not able to digest lactose. Lactose is a natural sugar found mainly in dairy milk and dairy products. A lactose-free diet can help you avoid foods and beverages that contain lactose. What are tips for following this plan? Reading food labels Do not consume foods, beverages, vitamins, minerals, or medicines containing lactose. Read ingredient lists carefully. Look for the words "lactose-free" on labels. Meal planning Use alternatives to dairy milk and foods made with milk products. These include the following: Lactose-free milk. Soy milk with added calcium and vitamin D. Almond milk, coconut milk, rice milk, or other nondairy milk alternatives with added calcium and vitamin D. Note that a lot of these are low in protein. Soy products, such as soy yogurt, soy cheese, soy ice cream, and soy-based sour cream. Other nut milk products, such as almond yogurt, almond cheese, cashew yogurt, cashew cheese, cashew ice cream, coconut yogurt, and coconut ice cream. Medicines, vitamins, and supplements Use lactase enzyme drops or tablets as directed by your health care provider. Make sure you get enough calcium and vitamin D in your diet. A lactose-free eating plan can be lacking in these important nutrients. Take calcium  and vitamin D supplements as directed by your health care provider. Talk with your health care provider about supplements if you are not able to get enough calcium and vitamin D from food. What foods should I eat?  Fruits All fresh, canned, frozen, or dried fruits and fruit juices that are not processed with lactose. Vegetables All fresh, frozen, and canned vegetables without cheese, cream, or butter sauces. Grains Any that are not made with dairy milk or dairy  products. Meats and other proteins Any meat, fish, poultry, and other protein sources that are not made with dairy milk or dairy products. Fats and oils Any that are not made with dairy milk or dairy products. Sweets and desserts Any that are not made with dairy milk or dairy products. Seasonings and condiments Any that are not made with dairy milk or dairy products. Calcium Calcium is found in many foods that contain lactose and is important for bone health. The amount of calcium you need depends on your age: Adults younger than 50 years: 1,000 mg of calcium a day. Adults older than 50 years: 1,200 mg of calcium a day. If you are not getting enough calcium, you may get it from other sources, including: Orange juice that has been fortified with calcium. This means that calcium has been added to the product. There are 300-350 mg of calcium in 1 cup (237 mL) of calcium-fortified orange juice. Soy milk fortified with calcium. There are 300-400 mg of calcium in 1 cup (237 mL) of calcium-fortified soy milk. Rice or almond milk fortified with calcium. There are 300 mg of calcium in 1 cup (237 mL) of calcium-fortified rice or almond milk. Breakfast cereals fortified with calcium. There are 100-1,000 mg of calcium in calcium-fortified breakfast cereals. Spinach, cooked. There are 145 mg of calcium in  cup (90 g) of cooked spinach. Edamame, cooked. There are 130 mg of calcium in  cup (47 g) of cooked edamame. Collard greens, cooked. There are 125 mg of calcium in  cup (85 g) of cooked collard greens. Kale, frozen or cooked. There are 90 mg of calcium in  cup (59 g) of cooked or frozen kale. Almonds. There are 95 mg of calcium in  cup (35 g) of almonds. Broccoli, cooked. There are 60 mg of calcium in 1 cup (156 g) of cooked broccoli. The items listed above may not be a complete list of foods and beverages you can eat. Contact a dietitian for more options. What foods should I avoid? Lactose is  found in dairy milk and dairy products, such as: Yogurt. Cheese. Butter. Margarine. Sour cream. Cream. Whipped toppings and creamers. Ice cream and other dairy-based desserts. Lactose is also found in foods or products made with dairy milk or milk ingredients. To find out whether a food contains dairy milk or a milk ingredient, look at the ingredients list. Avoid foods with the statement "May contain milk" and foods that contain: Milk powder. Whey. Curd. Lactose. Lactoglobulin. The items listed above may not be a complete list of foods and beverages to avoid. Contact a dietitian for more information. Where to find more information [employer] of Diabetes and Digestive and Kidney Diseases: [email or web address] Summary If you are lactose intolerant, it means that you are not able to digest lactose, a natural sugar found in milk and milk products. Following a lactose-free diet can help you manage this condition. Calcium is important for bone health and is found in many foods that contain lactose. Talk with your health  care provider about other sources of calcium. This information is not intended to replace advice given to you by your health care provider. Make sure you discuss any questions you have with your health care provider. Document Revised: 08/17/2020 Document Reviewed: 08/17/2020 Elsevier Patient Education  [place].

## 2022-06-21 NOTE — Progress Notes (Signed)
Lisa Spence    [id number]    [birth year]  Primary Care Physician:McElwee, Scheryl Darter, NP  Referring Physician: Charyl Dancer, NP Lisa Spence,  Lisa Spence [id number]   Chief complaint:  Abnormal LFT, excessive gas, abdominal bloating, GERD  HPI:  3 yr pleasant female here for follow-up visit for elevated transaminases and excessive gas with epigastric discomfort and burping  She has been experiencing worsening acid reflux symptoms, history of burping past few months, was evaluated by Janett Billow in April and was advised to increase pantoprazole to 75m BID and added famotidine at bedtme with no significant improvement.  He is using additional Tums as needed Only difference she noticed is decreased belching at nighttime but she continues to have excessive gas and belching during daytime  Denies any dysphagia or odynophagia.   Never had EGD    Row Labels Latest Ref Rng & Units 06/21/2022   12:35 PM 05/02/2022    7:30 AM 03/20/2022    8:17 AM  Hepatic Function   Section Header. No data exists in this row.      Total Protein   6.0 - 8.3 g/dL 7.5  7.6  6.4   Albumin   3.5 - 5.2 g/dL 4.7  4.8  4.6   AST   0 - 37 U/L 33  32  49   ALT   0 - 35 U/L 55  55  83   Alk Phosphatase   39 - 117 U/L 94  96  84   Total Bilirubin   0.2 - 1.2 mg/dL 0.4  0.4  0.5   Bilirubin, Direct   0.0 - 0.3 mg/dL 0.1  0.0     Denies any antibiotic use in June when she had significant elevation in AST and ALT, which have since trended down.  She was on Augmentin last month for few days  [place] Fatty liver, otherwise unremarkable right upper quadrant ultrasound.  Colonoscopy June 17, 2019 - One 10 mm polyp in the transverse colon, removed with a cold snare. Resected and retrieved. - Mild diverticulosis in the sigmoid colon. - Non-bleeding internal hemorrhoids.   Outpatient Encounter Medications as of 06/21/2022  Medication Sig   amoxicillin-clavulanate  (AUGMENTIN) 875-125 MG tablet Take 1 tablet by mouth 2 (two) times daily.   benzonatate (TESSALON) 100 MG capsule Take 1 capsule (100 mg total) by mouth 3 (three) times daily as needed for cough.   calcium carbonate (OS-CAL) 1250 (500 Ca) MG chewable tablet Chew 1 tablet by mouth daily.   cholecalciferol (VITAMIN D3) 25 MCG (1000 UNIT) tablet Take 1,000 Units by mouth daily.   clonazePAM (KLONOPIN) 0.5 MG tablet Take 1 tablet (0.5 mg total) by mouth 2 (two) times daily as needed for anxiety.   conjugated estrogens (PREMARIN) vaginal cream PLACE 1 APPLICATORFUL VAGINALLY 2 TIMES A WEEK   Efinaconazole 10 % SOLN Apply 1 application. topically daily.   famotidine (PEPCID) 20 MG tablet Take 1 tablet (20 mg total) by mouth at bedtime.   hydrocortisone 2.5 % cream Apply topically daily.   ketoconazole (NIZORAL) 2 % cream Apply topically daily.   Misc Natural Products (FOCUSED MIND PO) Take by mouth. Daily vitamins   Multiple Vitamins-Minerals (ONE A DAY WOMEN 50 PLUS PO) Take 1 tablet by mouth daily.   nystatin cream (MYCOSTATIN) Apply topically 2 (two) times daily.   Omega-3 Fatty Acids (FISH OIL OMEGA-3 PO) Take 1 capsule by mouth  daily.   pantoprazole (PROTONIX) 40 MG tablet Take 1 tablet (40 mg total) by mouth 2 (two) times daily before a meal.   polyethylene glycol (MIRALAX / GLYCOLAX) 17 g packet Take 17 g by mouth as needed.   venlafaxine XR (EFFEXOR XR) 75 MG 24 hr capsule Take 1 capsule (75 mg total) by mouth daily with breakfast.   vitamin C (ASCORBIC ACID) 500 MG tablet Take 500 mg by mouth daily.   No facility-administered encounter medications on file as of 06/21/2022.    Allergies as of 06/21/2022   (No Known Allergies)    Past Medical History:  Diagnosis Date   Allergy    Anxiety    GERD (gastroesophageal reflux disease)    Osteoporosis     Past Surgical History:  Procedure Laterality Date   ABDOMINAL HYSTERECTOMY     COLONOSCOPY     ORIF ANKLE FRACTURE Left 07/09/2017    Procedure: OPEN REDUCTION INTERNAL FIXATION (ORIF) ANKLE FRACTURE;  Surgeon: Nicholes Stairs, MD;  Location: [place];  Service: Orthopedics;  Laterality: Left;  90 mins    Family History  Problem Relation Age of Onset   Heart disease Mother    Heart disease Father    Heart disease Maternal Grandmother    Heart disease Maternal Grandfather    Heart disease Paternal Grandmother    Heart disease Paternal Grandfather    Colon cancer Neg Hx    Esophageal cancer Neg Hx    Rectal cancer Neg Hx    Stomach cancer Neg Hx     Social History   Socioeconomic History   Marital status: Single    Spouse name: Not on file   Number of children: Not on file   Years of education: Not on file   Highest education level: Not on file  Occupational History   Not on file  Tobacco Use   Smoking status: Never   Smokeless tobacco: Never  Vaping Use   Vaping Use: Never used  Substance and Sexual Activity   Alcohol use: No    Comment: no longer drinks, drank 6 glasses wine/week stopped in 2007   Drug use: No   Sexual activity: Not Currently  Other Topics Concern   Not on file  Social History Narrative   Not on file   Social Determinants of Health   Financial Resource Strain: Low Risk  (06/20/2022)   Overall Financial Resource Strain (CARDIA)    Difficulty of Paying Living Expenses: Not hard at all  Food Insecurity: No Food Insecurity (06/20/2022)   Hunger Vital Sign    Worried About Running Out of Food in the Last Year: Never true    Ran Out of Food in the Last Year: Never true  Transportation Needs: No Transportation Needs (06/20/2022)   PRAPARE - Hydrologist (Medical): No    Lack of Transportation (Non-Medical): No  Physical Activity: Sufficiently Active (06/20/2022)   Exercise Vital Sign    Days of Exercise per Week: 4 days    Minutes of Exercise per Session: 60 min  Stress: No Stress Concern Present (06/20/2022)   [place]    Feeling of Stress : Not at all  Social Connections: [place] (06/20/2022)   Social Connection and Isolation Panel [NHANES]    Frequency of Communication with Friends and Family: More than three times a week    Frequency of Social Gatherings with Friends and Family: More than three  times a week    Attends Religious Services: More than 4 times per year    Active Member of Clubs or Organizations: Yes    Attends Archivist Meetings: More than 4 times per year    Marital Status: Married  Human resources officer Violence: Not At Risk (06/20/2022)   Humiliation, Afraid, Rape, and Kick questionnaire    Fear of Current or Ex-Partner: No    Emotionally Abused: No    Physically Abused: No    Sexually Abused: No      Review of systems: All other review of systems negative except as mentioned in the HPI.   Physical Exam: Vitals:   06/21/22 1110  BP: 138/78  Pulse: 76  SpO2: 97%   Body mass index is 28.25 kg/m. Gen:      No acute distress HEENT:  sclera anicteric Abd:      soft, non-tender; no palpable masses, no distension Ext:    No edema Neuro: alert and oriented x 3 Psych: normal mood and affect  Data Reviewed:  Reviewed labs, radiology imaging, old records and pertinent past GI work up   Assessment and Plan/Recommendations:  71 year old very pleasant female with chronic history of GERD with persistent symptoms despite twice daily PPI, excessive belching  Will schedule for EGD for further evaluation, exclude erosive esophagitis Continue pantoprazole 40 mg daily in the morning and Pepcid at bedtime as needed  History of advanced adenomatous colon polyps, due for surveillance colonoscopy, schedule for colonoscopy along with EGD The risks and benefits as well as alternatives of endoscopic procedure(s) have been discussed and reviewed. All questions answered. The patient agrees to proceed.   Elevated transaminases  likely secondary to steatohepatitis Follow-up LFT Will obtain right upper quadrant ultrasound with elastography to assess degree of fibrosis Continue with dietary modifications and exercise Advised patient to avoid simple carbs or sugar  Generalized abdominal bloating and excessive belching could be possibly secondary to lactose intolerance, will do a trial of lactose-free diet for 1 to 2 weeks  The patient was provided an opportunity to ask questions and all were answered. The patient agreed with the plan and demonstrated an understanding of the instructions.  Damaris Hippo , MD    CC: Charyl Dancer, NP

## 2022-06-22 ENCOUNTER — Encounter: Payer: Self-pay | Admitting: Family Medicine

## 2022-06-22 ENCOUNTER — Ambulatory Visit (INDEPENDENT_AMBULATORY_CARE_PROVIDER_SITE_OTHER): Payer: Medicare HMO | Admitting: Family Medicine

## 2022-06-22 ENCOUNTER — Other Ambulatory Visit: Payer: Self-pay

## 2022-06-22 VITALS — BP 138/88 | HR 65 | Temp 98.6°F | Wt 173.4 lb

## 2022-06-22 DIAGNOSIS — R7989 Other specified abnormal findings of blood chemistry: Secondary | ICD-10-CM

## 2022-06-22 DIAGNOSIS — N958 Other specified menopausal and perimenopausal disorders: Secondary | ICD-10-CM

## 2022-06-22 LAB — POCT URINALYSIS DIPSTICK
Bilirubin, UA: NEGATIVE
Blood, UA: NEGATIVE
Glucose, UA: NEGATIVE
Ketones, UA: NEGATIVE
Leukocytes, UA: NEGATIVE
Nitrite, UA: NEGATIVE
Protein, UA: POSITIVE — AB
Spec Grav, UA: 1.025 (ref 1.010–1.025)
Urobilinogen, UA: 0.2 E.U./dL
pH, UA: 6 (ref 5.0–8.0)

## 2022-06-22 MED ORDER — PREMARIN 0.625 MG/GM VA CREA: TOPICAL_CREAM | VAGINAL | 5 refills | Status: DC

## 2022-06-22 NOTE — Progress Notes (Signed)
[facility] [address] [place] [address] Dept: [phone] Dept Fax: [phone]  Office Visit  Subjective:    Patient ID: Lisa Spence, female    DOB: 1950/10/15, 71 y.o..   MRN: [id number]  Chief Complaint  Patient presents with   Hospitalization Follow-up   Follow-up    F/U On urination. Pt stated urgency to urinate. No pain, no burning. No odor.    History of Present Illness:  Patient is in today with recurrent issues of urinary urgency. She notes that her trouble began 2-3 weeks ago,. when she developed fairly sudden onset o urinary dribbling. She was seen on 9/15 by Dr. Grandville Spence who treated her empirically with a course of antibiotics. However, her urine culture did return negative. she notes that the antibiotics did seem to help improve some lower back discomfort she was experiencing. However, now, she is back to having the urgency issue. Lisa Spence has a history of postmenopausal vaginal dryness. She has been managed with Premarin vaginal cream. However, she ran out of this about 6 weeks ago. she denies incontinence, except in extreme situaitons, where she gets a small amount of stress incontinence.  Past Medical History: Patient Active Problem List   Diagnosis Date Noted   Elevated blood pressure reading 05/02/2022   Belching 01/04/2022   Hyperlipidemia 12/06/2021   Elevated LFTs 11/08/2021   Bursitis of right hip 05/27/2021   Osteoporosis 03/10/2020   Hot flashes due to menopause 05/02/2019   Anxiety 04/25/2019   GERD (gastroesophageal reflux disease) 04/25/2019   Constipation 04/25/2019   Vaginal dryness, menopausal 04/25/2019   Left trimalleolar fracture, closed, initial encounter 07/09/2017   Past Surgical History:  Procedure Laterality Date   ABDOMINAL HYSTERECTOMY     COLONOSCOPY     ORIF ANKLE FRACTURE Left 07/09/2017   Procedure: OPEN REDUCTION INTERNAL FIXATION (ORIF) ANKLE FRACTURE;   Surgeon: Lisa Stairs, MD;  Location: [facility];  Service: Orthopedics;  Laterality: Left;  90 mins   Family History  Problem Relation Age of Onset   Heart disease Mother    Heart disease Father    Heart disease Maternal Grandmother    Heart disease Maternal Grandfather    Heart disease Paternal Grandmother    Heart disease Paternal Grandfather    Colon cancer Neg Hx    Esophageal cancer Neg Hx    Rectal cancer Neg Hx    Stomach cancer Neg Hx    Outpatient Medications Prior to Visit  Medication Sig Dispense Refill   calcium carbonate (OS-CAL) 1250 (500 Ca) MG chewable tablet Chew 1 tablet by mouth daily.     cholecalciferol (VITAMIN D3) 25 MCG (1000 UNIT) tablet Take 1,000 Units by mouth daily.     clonazePAM (KLONOPIN) 0.5 MG tablet Take 1 tablet (0.5 mg total) by mouth 2 (two) times daily as needed for anxiety. 60 tablet 0   Efinaconazole 10 % SOLN Apply 1 application. topically daily. 8 mL 2   famotidine (PEPCID) 20 MG tablet Take 1 tablet (20 mg total) by mouth at bedtime. 90 tablet 3   hydrocortisone 2.5 % cream Apply topically daily.     ketoconazole (NIZORAL) 2 % cream Apply topically daily.     Misc Natural Products (FOCUSED MIND PO) Take by mouth. Daily vitamins     Multiple Vitamins-Minerals (ONE A DAY WOMEN 50 PLUS PO) Take 1 tablet by mouth daily.     nystatin cream (MYCOSTATIN) Apply topically 2 (two) times daily.  Omega-3 Fatty Acids (FISH OIL OMEGA-3 PO) Take 1 capsule by mouth daily.     pantoprazole (PROTONIX) 40 MG tablet Take 1 tablet (40 mg total) by mouth daily. 90 tablet 3   polyethylene glycol (MIRALAX / GLYCOLAX) 17 g packet Take 17 g by mouth as needed.     venlafaxine XR (EFFEXOR XR) 75 MG 24 hr capsule Take 1 capsule (75 mg total) by mouth daily with breakfast. 90 capsule 1   vitamin C (ASCORBIC ACID) 500 MG tablet Take 500 mg by mouth daily.     conjugated estrogens (PREMARIN) vaginal cream PLACE 1 APPLICATORFUL VAGINALLY 2 TIMES A WEEK 30 g 5    amoxicillin-clavulanate (AUGMENTIN) 875-125 MG tablet Take 1 tablet by mouth 2 (two) times daily. (Patient not taking: Reported on 06/22/2022) 20 tablet 0   benzonatate (TESSALON) 100 MG capsule Take 1 capsule (100 mg total) by mouth 3 (three) times daily as needed for cough. (Patient not taking: Reported on 06/22/2022) 30 capsule 0   No facility-administered medications prior to visit.   No Known Allergies    Objective:   Today's Vitals   06/22/22 0938  BP: 138/88  Pulse: 65  Temp: 98.6 F (37 C)  TempSrc: Oral  SpO2: 95%  Weight: 173 lb 6.4 oz (78.7 kg)   Body mass index is 27.99 kg/m.   General: Well developed, well nourished. No acute distress. Psych: Alert and oriented. Normal mood and affect.  Health Maintenance Due  Topic Date Due   INFLUENZA VACCINE  04/25/2022   MAMMOGRAM  06/01/2022   COLONOSCOPY (Pts 45-40yr Insurance coverage will need to be confirmed)  06/16/2022     Lab Results: Component Ref Range & Units 09:41 13 d ago 5 mo ago 3 yr ago  Color, UA  yellow  YELLOW  yellow    Clarity, UA  clear  CLEAR  slightly cloudy    Glucose, UA Negative Negative  Negative  Negative    Bilirubin, UA  neg  NEG  negative    Ketones, UA  neg  NEG  negative    Spec Grav, UA 1.010 - 1.025 1.025  >=1.030 Abnormal   1.020    Blood, UA  neg  NEG  negative    pH, UA 5.0 - 8.0 6.0  5.0  6.0    Protein, UA Negative Positive Abnormal   Negative  Negative    Urobilinogen, UA 0.2 or 1.0 E.U./dL 0.2  negative Abnormal   0.2  0.2 R   Nitrite, UA  neg  NEG  negative    Leukocytes, UA Negative Negative  Negative  Negative    Appearance   YELLOW    CLEAR R   Assessment & Plan:   1. Genitourinary syndrome of menopause The urine is not suggestive of a UTI. I suspect she is experiencing genitourinary symptoms related to having gone off of her Premarin cream. I will renew her prescription. I advised her to use this daily for a week and then resume the twice weekly schedule. If her symptoms  are not resolved in 2-3 weeks, she should reach out to me regarding a urology referral.  - POCT Urinalysis Dipstick - conjugated estrogens (PREMARIN) vaginal cream; PLACE 1 APPLICATORFUL VAGINALLY 2 TIMES A WEEK  Dispense: 30 g; Refill: 5  Return if symptoms worsen or fail to improve.   SHaydee Salter MD

## 2022-06-24 ENCOUNTER — Encounter: Payer: Self-pay | Admitting: Gastroenterology

## 2022-06-26 ENCOUNTER — Encounter: Payer: Self-pay | Admitting: Gastroenterology

## 2022-06-30 ENCOUNTER — Ambulatory Visit: Payer: Medicare HMO

## 2022-07-10 ENCOUNTER — Encounter (HOSPITAL_COMMUNITY): Payer: Self-pay

## 2022-07-10 ENCOUNTER — Ambulatory Visit (HOSPITAL_COMMUNITY): Admission: RE | Admit: 2022-07-10 | Payer: Medicare HMO | Source: Ambulatory Visit

## 2022-07-18 ENCOUNTER — Ambulatory Visit (HOSPITAL_COMMUNITY)
Admission: RE | Admit: 2022-07-18 | Discharge: 2022-07-18 | Disposition: A | Discharge status: Home / Self Care | Payer: Medicare HMO | Source: Ambulatory Visit | Attending: Gastroenterology | Admitting: Gastroenterology

## 2022-07-18 DIAGNOSIS — K760 Fatty (change of) liver, not elsewhere classified: Secondary | ICD-10-CM | POA: Diagnosis not present

## 2022-07-18 DIAGNOSIS — R7989 Other specified abnormal findings of blood chemistry: Secondary | ICD-10-CM | POA: Insufficient documentation

## 2022-07-19 ENCOUNTER — Other Ambulatory Visit: Payer: Self-pay | Admitting: Nurse Practitioner

## 2022-07-19 ENCOUNTER — Ambulatory Visit (INDEPENDENT_AMBULATORY_CARE_PROVIDER_SITE_OTHER): Payer: Medicare HMO

## 2022-07-19 VITALS — BP 140/86

## 2022-07-19 DIAGNOSIS — Z1231 Encounter for screening mammogram for malignant neoplasm of breast: Secondary | ICD-10-CM

## 2022-07-19 DIAGNOSIS — Z23 Encounter for immunization: Secondary | ICD-10-CM | POA: Diagnosis not present

## 2022-07-19 NOTE — Progress Notes (Signed)
Per orders of Lauren McElwee, injection of influenza (high dose) given by Armandina Gemma, cma.  Patient tolerated injection well.

## 2022-07-25 ENCOUNTER — Encounter: Payer: Medicare HMO | Admitting: Gastroenterology

## 2022-07-27 ENCOUNTER — Encounter: Payer: Self-pay | Admitting: Gastroenterology

## 2022-07-27 ENCOUNTER — Telehealth: Payer: Self-pay

## 2022-07-27 NOTE — Telephone Encounter (Signed)
Patient sent a message through the patient portal inquiring about her abd u/s results. She requested a call back. I called her as requested. No answer. Left her a voicemail. Advised she does have fatty liver. If you will comment on the results and forward to her through My Chart, please.

## 2022-08-03 ENCOUNTER — Encounter: Payer: Self-pay | Admitting: Gastroenterology

## 2022-08-03 NOTE — Telephone Encounter (Signed)
Patient was sent results in mychart. Thanks

## 2022-08-04 ENCOUNTER — Other Ambulatory Visit (INDEPENDENT_AMBULATORY_CARE_PROVIDER_SITE_OTHER): Payer: Medicare HMO

## 2022-08-04 DIAGNOSIS — R7989 Other specified abnormal findings of blood chemistry: Secondary | ICD-10-CM

## 2022-08-04 LAB — HEPATIC FUNCTION PANEL
ALT: 52 U/L — ABNORMAL HIGH (ref 0–35)
AST: 33 U/L (ref 0–37)
Albumin: 4.8 g/dL (ref 3.5–5.2)
Alkaline Phosphatase: 93 U/L (ref 39–117)
Bilirubin, Direct: 0.1 mg/dL (ref 0.0–0.3)
Total Bilirubin: 0.4 mg/dL (ref 0.2–1.2)
Total Protein: 7.3 g/dL (ref 6.0–8.3)

## 2022-08-09 ENCOUNTER — Telehealth: Payer: Self-pay

## 2022-08-09 ENCOUNTER — Encounter: Payer: Medicare HMO | Admitting: Gastroenterology

## 2022-08-09 DIAGNOSIS — R7989 Other specified abnormal findings of blood chemistry: Secondary | ICD-10-CM

## 2022-08-09 DIAGNOSIS — K219 Gastro-esophageal reflux disease without esophagitis: Secondary | ICD-10-CM

## 2022-08-09 MED ORDER — ONDANSETRON HCL 4 MG PO TABS: 4.0000 mg | ORAL_TABLET | Freq: Three times a day (TID) | ORAL | 1 refills | Status: DC | PRN

## 2022-08-09 NOTE — Telephone Encounter (Signed)
Pt came in to [facility], had issues with her prep. Was rescheduled to 10/24/22 at 8:00am

## 2022-08-11 ENCOUNTER — Ambulatory Visit: Payer: Medicare HMO | Admitting: Podiatry

## 2022-08-11 ENCOUNTER — Telehealth: Payer: Self-pay | Admitting: Podiatry

## 2022-08-11 ENCOUNTER — Other Ambulatory Visit: Payer: Self-pay

## 2022-08-11 DIAGNOSIS — M79675 Pain in left toe(s): Secondary | ICD-10-CM

## 2022-08-11 DIAGNOSIS — L84 Corns and callosities: Secondary | ICD-10-CM | POA: Diagnosis not present

## 2022-08-11 DIAGNOSIS — B351 Tinea unguium: Secondary | ICD-10-CM

## 2022-08-11 DIAGNOSIS — M79674 Pain in right toe(s): Secondary | ICD-10-CM | POA: Diagnosis not present

## 2022-08-11 MED ORDER — EFINACONAZOLE 10 % EX SOLN: 1.0000 "application " | Freq: Every day | CUTANEOUS | 2 refills | Status: DC

## 2022-08-11 NOTE — Telephone Encounter (Signed)
Medication has been resent to correct pharmacy.  

## 2022-08-11 NOTE — Telephone Encounter (Signed)
Pt needing the Jublia Rx sent to [facility].  Pt stated it was sent to another pharmacy in [place] and they dont have her information and they dont take her insurance.  Please advise

## 2022-08-11 NOTE — Progress Notes (Unsigned)
Subjective: Chief Complaint  Patient presents with   Nail Problem    Patient report longs toenails, that are thick and difficult to trim. Patient states she lost her Jublia Rx bottle     71 year old female presents the office today for concerns of thick, elongated toenails that she can has difficulty trimming herself for nail fungus.  She states that she has been very busy recently and she has misplaced her Jublia.  Asking for refill.  No swelling redness or injury to the toenail sites.  No other concerns.   Objective: AAO x3, NAD DP/PT pulses palpable bilaterally, CRT less than 3 seconds Nails are hypertrophic, dystrophic, brittle, discolored, elongated 10. No surrounding redness or drainage. Tenderness nails 1-5 bilaterally. No open lesions or pre-ulcerative lesions are identified today Hyperkeratotic lesions noted medial hallux bilateral.  No underlying ulceration drainage or signs of infection.  Area on the plantar aspect left foot where she used a medicated pad no significant callus today. No pain with calf compression, swelling, warmth, erythema  Assessment: Symptomatic onychomycosis, hyperkeratotic lesions  Plan: -All treatment options discussed with the patient including all alternatives, risks, complications.  -Sharply to the nails x10 without any complications or bleeding.  Discussed topical medication.  Continue Jublia-refill today -Sharply debrided the mild hyperkeratotic lesion x2 without any complications or bleeding -Patient encouraged to call the office with any questions, concerns, change in symptoms.   Trula Slade DPM

## 2022-08-15 ENCOUNTER — Other Ambulatory Visit: Payer: Self-pay | Admitting: Nurse Practitioner

## 2022-08-15 DIAGNOSIS — F419 Anxiety disorder, unspecified: Secondary | ICD-10-CM

## 2022-08-15 NOTE — Telephone Encounter (Signed)
Patient called back and stated that the previous prescription that was sent for the Jublia Rx needs to be cx in order for [facility] can fill it.  Please advise

## 2022-08-16 NOTE — Telephone Encounter (Signed)
Noted, thanks!

## 2022-08-29 ENCOUNTER — Telehealth: Payer: Self-pay | Admitting: Podiatry

## 2022-08-29 NOTE — Telephone Encounter (Signed)
Patient is wanting to speak with Caryl Pina, please call her in the morning, said that there was a mix up at [facility], information received was incorrect, told her that they could not fill, can't call them back , lost number. She will explain in detail when she talks to you.

## 2022-08-29 NOTE — Telephone Encounter (Signed)
Pt called asking about her RX Jubilia cream.   Please advise

## 2022-08-30 ENCOUNTER — Telehealth: Payer: Self-pay | Admitting: Podiatry

## 2022-08-30 NOTE — Telephone Encounter (Signed)
Pt called about the Jublia rx. She got your message but still has questions. Please call her back. She stated she also left you a message yesterday.

## 2022-08-31 NOTE — Telephone Encounter (Signed)
Spoke to the pharmacy on Monday and they said that they called the patient and the patient told them that her medication wasn't write and that she would call her provider. I left a voicemail for the patient confirming that Dr. Jacqualyn Posey sent the emdication to [facility] and they were trying to get it filled for her but she didn't want them to fill it until she spoke with her provider.   I tried to call the patient back again today to see what is going on but no answer and left a vm to call back.

## 2022-08-31 NOTE — Telephone Encounter (Signed)
I have tried contact this patient since Monday and no answer and left messages. If patient calls back please get more detail about her medication as the patient pharmacy has been trying to get it filled for her since 08/11/22. Not sure what the issue is with her medication this time. Spoke to the pharmacy on Monday and they told me that they called the patient and she told them not to fill it but didn't give them a reason why. She just told them she needed to talk to Dr. Jacqualyn Posey.

## 2022-09-04 ENCOUNTER — Other Ambulatory Visit: Payer: Self-pay

## 2022-09-04 DIAGNOSIS — B351 Tinea unguium: Secondary | ICD-10-CM

## 2022-09-04 MED ORDER — EFINACONAZOLE 10 % EX SOLN: 1.0000 "application " | Freq: Every day | CUTANEOUS | 2 refills | Status: DC

## 2022-09-04 NOTE — Telephone Encounter (Signed)
Pt called about the Jublia rx.again. Still has questions on what's the problem in getting it and she's called multiple times and spoke with multiple ppl and still can't get her Rx   Please advise

## 2022-09-05 NOTE — Telephone Encounter (Signed)
This has already been taken care of and I have spoken with the patient as well.

## 2022-09-06 ENCOUNTER — Encounter: Payer: Self-pay | Admitting: Nurse Practitioner

## 2022-09-13 ENCOUNTER — Ambulatory Visit
Admission: RE | Admit: 2022-09-13 | Discharge: 2022-09-13 | Disposition: A | Discharge status: Home / Self Care | Payer: Medicare HMO | Source: Ambulatory Visit | Attending: Nurse Practitioner | Admitting: Nurse Practitioner

## 2022-09-13 DIAGNOSIS — Z1231 Encounter for screening mammogram for malignant neoplasm of breast: Secondary | ICD-10-CM

## 2022-09-14 ENCOUNTER — Ambulatory Visit: Payer: Medicare HMO | Admitting: Gastroenterology

## 2022-09-14 ENCOUNTER — Encounter: Payer: Self-pay | Admitting: *Deleted

## 2022-09-14 VITALS — BP 132/78 | HR 66 | Ht 66.0 in | Wt 170.0 lb

## 2022-09-14 DIAGNOSIS — K760 Fatty (change of) liver, not elsewhere classified: Secondary | ICD-10-CM | POA: Diagnosis not present

## 2022-09-14 DIAGNOSIS — Z1211 Encounter for screening for malignant neoplasm of colon: Secondary | ICD-10-CM

## 2022-09-14 DIAGNOSIS — K219 Gastro-esophageal reflux disease without esophagitis: Secondary | ICD-10-CM

## 2022-09-14 MED ORDER — PANTOPRAZOLE SODIUM 40 MG PO TBEC: 40.0000 mg | DELAYED_RELEASE_TABLET | Freq: Two times a day (BID) | ORAL | 1 refills | Status: DC

## 2022-09-14 NOTE — Patient Instructions (Addendum)
Keep your scheduled endoscopy/colonoscopy appointment.  _______________________________________________________  Please follow up with Dr Silverio Decamp in 3 months. _______________________________________________________  We have sent the following medications to your pharmacy for you to pick up at your convenience: Pantoprazole 40 mg twice daily (increase from previous dosing)  If you are age 69 or older, your body mass index should be between 23-30. Your Body mass index is 27.44 kg/m. If this is out of the aforementioned range listed, please consider follow up with your Primary Care Provider.  If you are age 9 or younger, your body mass index should be between 19-25. Your Body mass index is 27.44 kg/m. If this is out of the aformentioned range listed, please consider follow up with your Primary Care Provider.   ________________________________________________________  The [facility] providers would like to encourage you to use [facility] to communicate with providers for non-urgent requests or questions.  Due to long hold times on the telephone, sending your provider a message by [facility] may be a faster and more efficient way to get a response.  Please allow 48 business hours for a response.  Please remember that this is for non-urgent requests.  _______________________________________________________  Due to recent changes in healthcare laws, you may see the results of your imaging and laboratory studies on MyChart before your provider has had a chance to review them.  We understand that in some cases there may be results that are confusing or concerning to you. Not all laboratory results come back in the same time frame and the provider may be waiting for multiple results in order to interpret others.  Please give [place] 48 hours in order for your provider to thoroughly review all the results before contacting the office for clarification of your results.

## 2022-09-14 NOTE — Progress Notes (Signed)
Lisa Spence    [id number]    [birth year]  Primary Care Physician:McElwee, Scheryl Darter, NP  Referring Physician: Charyl Dancer, NP [place],  [place] [address]   Chief complaint: Epigastric pain, history of colon polyps  HPI:  71 yr pleasant female here for follow-up visit for epigastric pain, h/o colon polyps, elevated transaminases and excessive gas with epigastric discomfort and burping She has noticed decreased abdominal bloating and gas with decreasing milk products but continues to experience acid reflux symptoms despite taking pantoprazole twice daily and famotidine at bedtime.  She has noted slightly decreased belching.  She continues to have intermittent epigastric abdominal pain and diarrhea   Never had EGD   Outpatient Encounter Medications as of 09/14/2022  Medication Sig   calcium carbonate (OS-CAL) 1250 (500 Ca) MG chewable tablet Chew 1 tablet by mouth daily.   cholecalciferol (VITAMIN D3) 25 MCG (1000 UNIT) tablet Take 1,000 Units by mouth daily.   clonazePAM (KLONOPIN) 0.5 MG tablet TAKE 1 TABLET(0.5 MG) BY MOUTH TWICE DAILY AS NEEDED FOR ANXIETY   conjugated estrogens (PREMARIN) vaginal cream PLACE 1 APPLICATORFUL VAGINALLY 2 TIMES A WEEK   Efinaconazole 10 % SOLN Apply 1 application  topically daily.   famotidine (PEPCID) 20 MG tablet Take 1 tablet (20 mg total) by mouth at bedtime.   hydrocortisone 2.5 % cream Apply topically daily.   ketoconazole (NIZORAL) 2 % cream Apply topically daily.   Misc Natural Products (FOCUSED MIND PO) Take by mouth. Daily vitamins   Multiple Vitamins-Minerals (ONE A DAY WOMEN 50 PLUS PO) Take 1 tablet by mouth daily.   nystatin cream (MYCOSTATIN) Apply topically 2 (two) times daily.   Omega-3 Fatty Acids (FISH OIL OMEGA-3 PO) Take 1 capsule by mouth daily.   pantoprazole (PROTONIX) 40 MG tablet Take 1 tablet (40 mg total) by mouth daily.   polyethylene glycol (MIRALAX / GLYCOLAX) 17 g packet Take  17 g by mouth as needed.   venlafaxine XR (EFFEXOR XR) 75 MG 24 hr capsule Take 1 capsule (75 mg total) by mouth daily with breakfast.   ondansetron (ZOFRAN) 4 MG tablet Take 1 tablet (4 mg total) by mouth every 8 (eight) hours as needed for nausea or vomiting. Take 30 minutes prior to taking prep and as needed every 8 hours (Patient not taking: Reported on 09/14/2022)   [DISCONTINUED] vitamin C (ASCORBIC ACID) 500 MG tablet Take 500 mg by mouth daily. (Patient not taking: Reported on 09/14/2022)   No facility-administered encounter medications on file as of 09/14/2022.    Allergies as of 09/14/2022   (No Known Allergies)    Past Medical History:  Diagnosis Date   Allergy    Anxiety    Fatty liver    GERD (gastroesophageal reflux disease)    Internal hemorrhoids    Osteoporosis     Past Surgical History:  Procedure Laterality Date   ABDOMINAL HYSTERECTOMY     COLONOSCOPY     ORIF ANKLE FRACTURE Left 07/09/2017   Procedure: OPEN REDUCTION INTERNAL FIXATION (ORIF) ANKLE FRACTURE;  Surgeon: Nicholes Stairs, MD;  Location: [facility] [facility];  Service: Orthopedics;  Laterality: Left;  90 mins    Family History  Problem Relation Age of Onset   Heart disease Mother    Heart disease Father    Heart disease Maternal Grandmother    Heart disease Maternal Grandfather    Heart disease Paternal Grandmother    Heart disease  Paternal Grandfather    Colon cancer Neg Hx    Esophageal cancer Neg Hx    Rectal cancer Neg Hx    Stomach cancer Neg Hx     Social History   Socioeconomic History   Marital status: Single    Spouse name: Not on file   Number of children: Not on file   Years of education: Not on file   Highest education level: Not on file  Occupational History   Not on file  Tobacco Use   Smoking status: Never   Smokeless tobacco: Never  Vaping Use   Vaping Use: Never used  Substance and Sexual Activity   Alcohol use: No    Comment: no longer drinks, drank 6 glasses  wine/week stopped in 2007   Drug use: No   Sexual activity: Not Currently  Other Topics Concern   Not on file  Social History Narrative   Not on file   Social Determinants of Health   Financial Resource Strain: Low Risk  (06/20/2022)   Overall Financial Resource Strain (CARDIA)    Difficulty of Paying Living Expenses: Not hard at all  Food Insecurity: No Food Insecurity (06/20/2022)   Hunger Vital Sign    Worried About Running Out of Food in the Last Year: Never true    [place] in the Last Year: Never true  Transportation Needs: No Transportation Needs (06/20/2022)   PRAPARE - Hydrologist (Medical): No    Lack of Transportation (Non-Medical): No  Physical Activity: Sufficiently Active (06/20/2022)   Exercise Vital Sign    Days of Exercise per Week: 4 days    Minutes of Exercise per Session: 60 min  Stress: No Stress Concern Present (06/20/2022)   [place]    Feeling of Stress : Not at all  Social Connections: [place] (06/20/2022)   Social Connection and Isolation Panel [NHANES]    Frequency of Communication with Friends and Family: More than three times a week    Frequency of Social Gatherings with Friends and Family: More than three times a week    Attends Religious Services: More than 4 times per year    Active Member of [employer] or Organizations: Yes    Attends Music therapist: More than 4 times per year    Marital Status: Married  Human resources officer Violence: Not At Risk (06/20/2022)   Humiliation, Afraid, Rape, and Kick questionnaire    Fear of Current or Ex-Partner: No    Emotionally Abused: No    Physically Abused: No    Sexually Abused: No      Review of systems: All other review of systems negative except as mentioned in the HPI.   Physical Exam: Vitals:   09/14/22 1051  BP: 132/78  Pulse: 66  SpO2: 99%   Body mass index is 27.44  kg/m. Gen:      No acute distress Neuro: alert and oriented x 3 Psych: normal mood and affect  Data Reviewed:  Reviewed labs, radiology imaging, old records and pertinent past GI work up   Assessment and Plan/Recommendations:  71 year old very pleasant female with chronic history of GERD with persistent symptoms despite twice daily PPI, excessive belching   Will schedule for EGD for further evaluation, exclude erosive esophagitis If negative for erosive esophagitis we will plan to place 48-hour Bravo capsule to evaluate for uncontrolled acid reflux Continue pantoprazole 40 mg daily in the morning  and Pepcid at bedtime as needed   History of advanced adenomatous colon polyps, due for surveillance colonoscopy, schedule for colonoscopy along with EGD The risks and benefits as well as alternatives of endoscopic procedure(s) have been discussed and reviewed. All questions answered. The patient agrees to proceed.    The patient was provided an opportunity to ask questions and all were answered. The patient agreed with the plan and demonstrated an understanding of the instructions.  Damaris Hippo , MD    CC: Charyl Dancer, NP

## 2022-09-19 ENCOUNTER — Encounter: Payer: Self-pay | Admitting: Gastroenterology

## 2022-09-21 ENCOUNTER — Telehealth: Payer: Self-pay

## 2022-09-21 NOTE — Telephone Encounter (Signed)
Patient needing PA for Prolia injection

## 2022-09-28 ENCOUNTER — Other Ambulatory Visit (HOSPITAL_COMMUNITY): Payer: Self-pay

## 2022-09-28 NOTE — Telephone Encounter (Signed)
Pharmacy Patient Advocate Encounter  Insurance verification completed.    The patient is insured through [employer]   Ran test claims for: Prolia 60mg.  Pharmacy benefit copay: $100.00  

## 2022-09-28 NOTE — Telephone Encounter (Signed)
Prolia VOB initiated via [email or web address] 

## 2022-09-29 NOTE — Telephone Encounter (Addendum)
   Patient Advocate Encounter  Prior Authorization for [place]  has been approved.    PA# [id number] Effective dates: 09/29/22 through 09/30/23

## 2022-09-29 NOTE — Telephone Encounter (Signed)
Pt ready for scheduling on or after 09/29/22  Out-of-pocket cost due at time of visit: $301  Primary: [employer] Prolia co-insurance: 20% (approximately $276) Admin fee co-insurance: 20% (approximately $25)  Secondary:  Prolia co-insurance:  Admin fee co-insurance:   Deductible: $0  Prior Auth: approved PA# [id number] Valid: 09/29/22 to 09/30/23  ** This summary of benefits is an estimation of the patient's out-of-pocket cost. Exact cost may vary based on individual plan coverage.

## 2022-10-02 NOTE — Telephone Encounter (Signed)
Noted  

## 2022-10-02 NOTE — Telephone Encounter (Signed)
Spoke with patient went over approval for Prolia and  out of pocket cost due at time of visit. Per patient she is currently out of town but would like to think about the injection some more she will give [place] a call back to schedule an appointment and also schedule an appointment to see Lauren for some other issues.

## 2022-10-12 ENCOUNTER — Encounter: Payer: Medicare HMO | Admitting: Gastroenterology

## 2022-10-19 ENCOUNTER — Ambulatory Visit
Admission: RE | Admit: 2022-10-19 | Discharge: 2022-10-19 | Disposition: A | Discharge status: Home / Self Care | Payer: Medicare HMO | Source: Ambulatory Visit | Attending: Nurse Practitioner | Admitting: Nurse Practitioner

## 2022-10-19 ENCOUNTER — Encounter: Payer: Self-pay | Admitting: Nurse Practitioner

## 2022-10-19 DIAGNOSIS — M810 Age-related osteoporosis without current pathological fracture: Secondary | ICD-10-CM | POA: Diagnosis not present

## 2022-10-19 DIAGNOSIS — Z780 Asymptomatic menopausal state: Secondary | ICD-10-CM

## 2022-10-19 DIAGNOSIS — M8588 Other specified disorders of bone density and structure, other site: Secondary | ICD-10-CM | POA: Diagnosis not present

## 2022-10-20 ENCOUNTER — Encounter: Payer: Self-pay | Admitting: Gastroenterology

## 2022-10-20 NOTE — Progress Notes (Unsigned)
   Established Patient Office Visit  Subjective   Patient ID: Lisa Spence, female    DOB: 10-Aug-1951  Age: 72 y.o. MRN: [id number]  No chief complaint on file.   HPI  Lisa Spence is here to follow-up on her recent Dexa scan. T score was -2.8 on 10/19/22.   {History (Optional):23778}  ROS    Objective:     There were no vitals taken for this visit. {Vitals History (Optional):23777}  Physical Exam   No results found for any visits on 10/24/22.  {Labs (Optional):23779}  The 10-year ASCVD risk score (Arnett DK, et al., 2019) is: 8.9%    Assessment & Plan:   Problem List Items Addressed This Visit   None   No follow-ups on file.    Charyl Dancer, NP

## 2022-10-24 ENCOUNTER — Ambulatory Visit (INDEPENDENT_AMBULATORY_CARE_PROVIDER_SITE_OTHER): Payer: Medicare HMO | Admitting: Nurse Practitioner

## 2022-10-24 ENCOUNTER — Encounter: Payer: Medicare HMO | Admitting: Gastroenterology

## 2022-10-24 ENCOUNTER — Encounter: Payer: Self-pay | Admitting: Nurse Practitioner

## 2022-10-24 VITALS — BP 138/84 | HR 79 | Temp 95.6°F | Ht 66.0 in | Wt 179.2 lb

## 2022-10-24 DIAGNOSIS — R030 Elevated blood-pressure reading, without diagnosis of hypertension: Secondary | ICD-10-CM

## 2022-10-24 DIAGNOSIS — R3912 Poor urinary stream: Secondary | ICD-10-CM

## 2022-10-24 DIAGNOSIS — M810 Age-related osteoporosis without current pathological fracture: Secondary | ICD-10-CM

## 2022-10-24 LAB — POCT URINALYSIS DIP (CLINITEK)
Bilirubin, UA: NEGATIVE
Blood, UA: NEGATIVE
Glucose, UA: NEGATIVE mg/dL
Ketones, POC UA: NEGATIVE mg/dL
Leukocytes, UA: NEGATIVE
Nitrite, UA: NEGATIVE
POC PROTEIN,UA: NEGATIVE
Spec Grav, UA: >=1.03 — AB (ref 1.010–1.025)
Urobilinogen, UA: 0.2 E.U./dL
pH, UA: 5.5 (ref 5.0–8.0)

## 2022-10-24 MED ORDER — VENLAFAXINE HCL ER 75 MG PO CP24: 75.0000 mg | ORAL_CAPSULE | Freq: Every day | ORAL | 1 refills | Status: DC

## 2022-10-24 NOTE — Patient Instructions (Signed)
It was great to see you!  We will reach out to you to schedule your prolia injection.   I have placed a referral to urology, they will call to schedule.   Get a new blood pressure cuff.   Let's follow-up in 3 months, sooner if you have concerns.  If a referral was placed today, you will be contacted for an appointment. Please note that routine referrals can sometimes take up to 3-4 weeks to process. Please call our office if you haven't heard anything after this time frame.  Take care,  Vance Peper, NP

## 2022-10-24 NOTE — Assessment & Plan Note (Addendum)
Chronic, ongoing. Blood pressure 138/84 today.  She brought in her cuff from home which is showing 167/92.  Discussed that this cuff is not accurate and she should exchange it for a different cuff.  Discussed limiting salt in her diet and salts that naturally contain food.

## 2022-10-24 NOTE — Assessment & Plan Note (Addendum)
Chronic, ongoing. She has been having an ongoing weak urinary stream for the past 4 to 5 months.  UA today was negative, along with prior UA in 05/2022.  With ongoing symptoms, will place referral to urology.

## 2022-10-24 NOTE — Assessment & Plan Note (Signed)
Updated DEXA scan on 120/25/24 showed a T-score of -2.8.  It has slightly decreased from her DEXA in 2021 which showed a T-score of -2.5.  She has uncontrolled acid reflux, will start her on Prolia injections.  This was approved from her insurance, will let CMA know to get her scheduled for Prolia injection in the office.  Will recheck DEXA scan in 2 years.  Continue calcium and vitamin D supplement along with exercise.

## 2022-10-31 ENCOUNTER — Telehealth: Payer: Self-pay

## 2022-10-31 NOTE — Telephone Encounter (Signed)
Spoke with patient regarding Prolia injection patient wanting to schedule appointment to come in for injection. Went over co-pay amount patient who verbalized understanding and will pay at time of visit.

## 2022-11-03 NOTE — Progress Notes (Deleted)
Per orders of Vance Peper, pt is here to receive Prolia Injection, pt received Prolia injection in *** SubQ. Pt tolerated Prolia Injection well. Pt will return in 6 months for next Prolia Injection.

## 2022-11-07 ENCOUNTER — Ambulatory Visit: Payer: Medicare HMO

## 2022-11-07 DIAGNOSIS — M810 Age-related osteoporosis without current pathological fracture: Secondary | ICD-10-CM

## 2022-11-08 ENCOUNTER — Ambulatory Visit (INDEPENDENT_AMBULATORY_CARE_PROVIDER_SITE_OTHER): Payer: Medicare HMO

## 2022-11-08 DIAGNOSIS — M810 Age-related osteoporosis without current pathological fracture: Secondary | ICD-10-CM | POA: Diagnosis not present

## 2022-11-08 MED ORDER — DENOSUMAB 60 MG/ML ~~LOC~~ SOSY
60.0000 mg | PREFILLED_SYRINGE | Freq: Once | SUBCUTANEOUS | Status: AC
  Administered 2022-11-08: 60 mg via SUBCUTANEOUS

## 2022-11-08 NOTE — Progress Notes (Signed)
After obtaining consent, and per orders of Lauren Mcelwee, injection of Prolia 11m given SubQ in the Right arm by ASomaliaL. CMA/CPT  Patient instructed to remain in clinic for 20 minutes afterwards, and to report any adverse reaction to me immediately.

## 2022-11-17 ENCOUNTER — Ambulatory Visit: Payer: Medicare HMO | Admitting: Podiatry

## 2022-11-17 VITALS — BP 155/75 | HR 75

## 2022-11-17 DIAGNOSIS — M7661 Achilles tendinitis, right leg: Secondary | ICD-10-CM | POA: Diagnosis not present

## 2022-11-17 DIAGNOSIS — M79675 Pain in left toe(s): Secondary | ICD-10-CM

## 2022-11-17 DIAGNOSIS — M79674 Pain in right toe(s): Secondary | ICD-10-CM

## 2022-11-17 DIAGNOSIS — B351 Tinea unguium: Secondary | ICD-10-CM

## 2022-11-17 DIAGNOSIS — M7662 Achilles tendinitis, left leg: Secondary | ICD-10-CM

## 2022-11-17 NOTE — Progress Notes (Signed)
Subjective: Chief Complaint  Patient presents with   Nail Problem    Routine Foot Care    Callouses    Callus removal      72 year old female presents the office today for concerns of thick, elongated toenails that she can has difficulty trimming herself for nail fungus.  She still been using the [place].  She had some calluses to her feet.  She previously was getting some pain on the Achilles tendon but the pain is resolved.  No injuries that she reports.  No other concerns.    Objective: AAO x3, NAD DP/PT pulses palpable bilaterally, CRT less than 3 seconds Nails are hypertrophic, dystrophic, brittle, discolored, elongated 10. No surrounding redness or drainage. Tenderness nails 1-5 bilaterally.  There is minimal callus formation on the medial first MPJ without any underlying ulceration drainage or signs of infection. There is currently no pain on the course or insertion Achilles tendon.  Tendon appears to be intact.  No edema, erythema. No pain with calf compression, swelling, warmth, erythema  Assessment: Symptomatic onychomycosis, hyperkeratotic lesions, Achilles tendonitis  Plan: -All treatment options discussed with the patient including all alternatives, risks, complications.  -Sharply to the nails x10 without any complications or bleeding.  Discussed topical medication.  Continue Jublia -Sharply debrided the mild hyperkeratotic lesion x2 without any complications or bleeding -Discussed stretching, icing for the Achilles tendon as well as shoes and good arch support. -Patient encouraged to call the office with any questions, concerns, change in symptoms.   Trula Slade DPM

## 2022-11-17 NOTE — Patient Instructions (Signed)

## 2022-11-22 ENCOUNTER — Telehealth: Payer: Self-pay | Admitting: *Deleted

## 2022-11-22 ENCOUNTER — Encounter: Payer: Self-pay | Admitting: *Deleted

## 2022-11-22 ENCOUNTER — Encounter: Payer: Self-pay | Admitting: Gastroenterology

## 2022-11-22 NOTE — Telephone Encounter (Signed)
New instructions made in My Chart with the new dates of colon/egd for 11/28/2022 at [facility] with patient she had several questions. Explained and went over about holding PPI for 7 days prior and went over two day prep and nausea meds again. She will call back if she has any more questions

## 2022-11-28 ENCOUNTER — Ambulatory Visit (AMBULATORY_SURGERY_CENTER): Payer: Medicare HMO | Admitting: Gastroenterology

## 2022-11-28 ENCOUNTER — Encounter: Payer: Self-pay | Admitting: Gastroenterology

## 2022-11-28 ENCOUNTER — Ambulatory Visit: Payer: Medicare HMO | Admitting: Gastroenterology

## 2022-11-28 ENCOUNTER — Encounter: Payer: Medicare HMO | Admitting: Gastroenterology

## 2022-11-28 VITALS — BP 141/87 | HR 69 | Temp 98.9°F | Resp 10 | Ht 66.0 in | Wt 170.0 lb

## 2022-11-28 DIAGNOSIS — K209 Esophagitis, unspecified without bleeding: Secondary | ICD-10-CM

## 2022-11-28 DIAGNOSIS — K317 Polyp of stomach and duodenum: Secondary | ICD-10-CM | POA: Diagnosis not present

## 2022-11-28 DIAGNOSIS — Z09 Encounter for follow-up examination after completed treatment for conditions other than malignant neoplasm: Secondary | ICD-10-CM

## 2022-11-28 DIAGNOSIS — Z86010 Personal history of colonic polyps: Secondary | ICD-10-CM

## 2022-11-28 DIAGNOSIS — K229 Disease of esophagus, unspecified: Secondary | ICD-10-CM | POA: Diagnosis not present

## 2022-11-28 DIAGNOSIS — K760 Fatty (change of) liver, not elsewhere classified: Secondary | ICD-10-CM | POA: Diagnosis not present

## 2022-11-28 DIAGNOSIS — K219 Gastro-esophageal reflux disease without esophagitis: Secondary | ICD-10-CM | POA: Diagnosis not present

## 2022-11-28 DIAGNOSIS — Z1211 Encounter for screening for malignant neoplasm of colon: Secondary | ICD-10-CM

## 2022-11-28 DIAGNOSIS — R69 Illness, unspecified: Secondary | ICD-10-CM | POA: Diagnosis not present

## 2022-11-28 DIAGNOSIS — K2100 Gastro-esophageal reflux disease with esophagitis, without bleeding: Secondary | ICD-10-CM

## 2022-11-28 DIAGNOSIS — K2210 Ulcer of esophagus without bleeding: Secondary | ICD-10-CM | POA: Diagnosis not present

## 2022-11-28 MED ORDER — SODIUM CHLORIDE 0.9 % IV SOLN: 500.0000 mL | INTRAVENOUS | Status: DC

## 2022-11-28 NOTE — Op Note (Signed)
[facility] Patient Name: Lisa Spence Procedure Date: 11/28/2022 10:11 AM MRN: [id number] Endoscopist: Mauri Pole , MD, [id number] Age: 72 Referring MD:  Date of Birth: 1951/08/16 Gender: Female Account #: [id number] Procedure:                Upper GI endoscopy Indications:              Epigastric abdominal pain, Esophageal reflux                            symptoms that persist despite appropriate therapy Medicines:                Monitored Anesthesia Care Procedure:                Pre-Anesthesia Assessment:                           - Prior to the procedure, a History and Physical                            was performed, and patient medications and                            allergies were reviewed. The patient's tolerance of                            previous anesthesia was also reviewed. The risks                            and benefits of the procedure and the sedation                            options and risks were discussed with the patient.                            All questions were answered, and informed consent                            was obtained. Prior Anticoagulants: The patient has                            taken no anticoagulant or antiplatelet agents. ASA                            Grade Assessment: II - A patient with mild systemic                            disease. After reviewing the risks and benefits,                            the patient was deemed in satisfactory condition to                            undergo the procedure.  After obtaining informed consent, the endoscope was                            passed under direct vision. Throughout the                            procedure, the patient's blood pressure, pulse, and                            oxygen saturations were monitored continuously. The                            Olympus Scope [id number] was introduced through the                             mouth, and advanced to the second part of duodenum.                            The upper GI endoscopy was accomplished without                            difficulty. The patient tolerated the procedure                            well. Scope In: Scope Out: Findings:                 LA Grade C (one or more mucosal breaks continuous                            between tops of 2 or more mucosal folds, less than                            75% circumference) esophagitis with no bleeding was                            found 30 to 38 cm from the incisors. Biopsies were                            obtained from the proximal and distal esophagus                            with cold forceps for histology of suspected                            eosinophilic esophagitis.                           The gastroesophageal flap valve was visualized                            endoscopically and classified as Hill Grade II                            (  fold present, opens with respiration).                           Patchy mild inflammation characterized by                            congestion (edema) and erythema was found in the                            entire examined stomach. Biopsies were taken with a                            cold forceps for Helicobacter pylori testing.                           Multiple 5 to 12 mm sessile polyps with no stigmata                            of recent bleeding were found in the gastric fundus                            and in the gastric body. Biopsies were taken with a                            cold forceps for histology.                           The cardia and gastric fundus were normal on                            retroflexion.                           The examined duodenum was normal. Complications:            No immediate complications. Estimated Blood Loss:     Estimated blood loss was minimal. Impression:               - LA Grade C reflux esophagitis with no  bleeding.                           - Gastroesophageal flap valve classified as Hill                            Grade II (fold present, opens with respiration).                           - Gastritis. Biopsied.                           - Multiple gastric polyps. Biopsied.                           - Normal examined duodenum.                           -  Biopsies were taken with a cold forceps for                            evaluation of eosinophilic esophagitis. Recommendation:           - Resume previous diet.                           - Continue present medications.                           - Await pathology results.                           - Follow an antireflux regimen.                           - Schedule esophageal manometry next available                            appointment                           - Return to GI office at appointment to be                            scheduled with Dr.Cirigliano to discuss possible                            TIF. Mauri Pole, MD 11/28/2022 11:15:00 AM This report has been signed electronically.

## 2022-11-28 NOTE — Patient Instructions (Addendum)
HANDOUTS PROVIDED ON: ESOPHAGITIS, GASTRITIS, DIVERTICULOSIS, & HEMORRHOIDS  The biopsies taken today have been sent for pathology.  The results can take 1-3 weeks to receive.  Your next colonoscopy should occur in 5 years.    You may resume your previous diet and medication schedule.  Use Benefiber 1 tablet 2-3 times daily with meals.  Use 1 capful of Miralax daily as needed to prevent constipation.  Both of these can be purchased over the counter.  A nurse from Dr. Woodward Ku office will reach out to you about scheduling the procedures she discussed with you today.  (Esophageal Manometry)   I have scheduled you with Dr. Bryan Lemma on 5/8 @ 2:20 pm to discuss possible TIF.  Thank you for allowing [place] to care for you today!!!   YOU HAD AN ENDOSCOPIC PROCEDURE TODAY AT [facility]:   Refer to the procedure report that was given to you for any specific questions about what was found during the examination.  If the procedure report does not answer your questions, please call your gastroenterologist to clarify.  If you requested that your care partner not be given the details of your procedure findings, then the procedure report has been included in a sealed envelope for you to review at your convenience later.  YOU SHOULD EXPECT: Some feelings of bloating in the abdomen. Passage of more gas than usual.  Walking can help get rid of the air that was put into your GI tract during the procedure and reduce the bloating. If you had a lower endoscopy (such as a colonoscopy or flexible sigmoidoscopy) you may notice spotting of blood in your stool or on the toilet paper. If you underwent a bowel prep for your procedure, you may not have a normal bowel movement for a few days.  Please Note:  You might notice some irritation and congestion in your nose or some drainage.  This is from the oxygen used during your procedure.  There is no need for concern and it should clear up in a day or  so.  SYMPTOMS TO REPORT IMMEDIATELY:  Following lower endoscopy (colonoscopy or flexible sigmoidoscopy):  Excessive amounts of blood in the stool  Significant tenderness or worsening of abdominal pains  Swelling of the abdomen that is new, acute  Fever of 100F or higher  Following upper endoscopy (EGD)  Vomiting of blood or coffee ground material  New chest pain or pain under the shoulder blades  Painful or persistently difficult swallowing  New shortness of breath  Fever of 100F or higher  Black, tarry-looking stools  For urgent or emergent issues, a gastroenterologist can be reached at any hour by calling [phone]. Do not use MyChart messaging for urgent concerns.    DIET:  We do recommend a small meal at first, but then you may proceed to your regular diet.  Drink plenty of fluids but you should avoid alcoholic beverages for 24 hours.  ACTIVITY:  You should plan to take it easy for the rest of today and you should NOT DRIVE or use heavy machinery until tomorrow (because of the sedation medicines used during the test).    FOLLOW UP: Our staff will call the number listed on your records the next business day following your procedure.  We will call around 7:15- 8:00 am to check on you and address any questions or concerns that you may have regarding the information given to you following your procedure. If we do not reach you, we will  leave a message.     If any biopsies were taken you will be contacted by phone or by letter within the next 1-3 weeks.  Please call us at [phone] if you have not heard about the biopsies in 3 weeks.    SIGNATURES/CONFIDENTIALITY: You and/or your care partner have signed paperwork which will be entered into your electronic medical record.  These signatures attest to the fact that that the information above on your After Visit Summary has been reviewed and is understood.  Full responsibility of the confidentiality of this discharge  information lies with you and/or your care-partner.

## 2022-11-28 NOTE — Progress Notes (Unsigned)
[facility] History and Physical   Primary Care Physician:  Charyl Dancer, NP   Reason for Procedure:  GERD, epigastric pain, h/o adenomatous colon polyps  Plan:    EGD +/- 48 hr pH Bravo and colonoscopy with possible interventions as needed     HPI: Lisa Spence is a very pleasant 72 y.o. female here for EGD +/- 48 hr pH Bravo and colonoscopy with possible interventions as needed for evaluation of persistent GERD symptoms, epigastric pain, and h/o adenomatous colon polyps. Please refer to office visit note  09/14/22 for additional details  The risks and benefits as well as alternatives of endoscopic procedure(s) have been discussed and reviewed. All questions answered. The patient agrees to proceed.    Past Medical History:  Diagnosis Date   Allergy    Anxiety    Fatty liver    GERD (gastroesophageal reflux disease)    Internal hemorrhoids    Osteoporosis     Past Surgical History:  Procedure Laterality Date   ABDOMINAL HYSTERECTOMY     COLONOSCOPY     ORIF ANKLE FRACTURE Left 07/09/2017   Procedure: OPEN REDUCTION INTERNAL FIXATION (ORIF) ANKLE FRACTURE;  Surgeon: Nicholes Stairs, MD;  Location: [facility];  Service: Orthopedics;  Laterality: Left;  90 mins    Prior to Admission medications   Medication Sig Start Date End Date Taking? Authorizing Provider  calcium carbonate (OS-CAL) 1250 (500 Ca) MG chewable tablet Chew 1 tablet by mouth daily.   Yes [provider]  cholecalciferol (VITAMIN D3) 25 MCG (1000 UNIT) tablet Take 1,000 Units by mouth daily.   Yes [provider]  clonazePAM (KLONOPIN) 0.5 MG tablet TAKE 1 TABLET(0.5 MG) BY MOUTH TWICE DAILY AS NEEDED FOR ANXIETY 08/16/22  Yes McElwee, Lauren A, NP  conjugated estrogens (PREMARIN) vaginal cream PLACE 1 APPLICATORFUL VAGINALLY 2 TIMES A WEEK 06/22/22  Yes Haydee Salter, MD  Efinaconazole 10 % SOLN Apply 1 application  topically daily. 09/04/22  Yes Trula Slade,  DPM  hydrocortisone 2.5 % cream Apply topically daily. 10/06/21  Yes [provider]  ketoconazole (NIZORAL) 2 % cream Apply topically daily. 10/06/21  Yes [provider]  Misc Natural Products (FOCUSED MIND PO) Take by mouth. Daily vitamins   Yes [provider]  Multiple Vitamins-Minerals (ONE A DAY WOMEN 50 PLUS PO) Take 1 tablet by mouth daily.   Yes [provider]  Omega-3 Fatty Acids (FISH OIL OMEGA-3 PO) Take 1 capsule by mouth daily.   Yes [provider]  ondansetron (ZOFRAN) 4 MG tablet Take 1 tablet (4 mg total) by mouth every 8 (eight) hours as needed for nausea or vomiting. Take 30 minutes prior to taking prep and as needed every 8 hours 08/09/22  Yes Rayli Wiederhold, Venia Minks, MD  polyethylene glycol (MIRALAX / GLYCOLAX) 17 g packet Take 17 g by mouth as needed.   Yes [provider]  venlafaxine XR (EFFEXOR XR) 75 MG 24 hr capsule Take 1 capsule (75 mg total) by mouth daily with breakfast. 10/24/22  Yes McElwee, Lauren A, NP  famotidine (PEPCID) 20 MG tablet Take 1 tablet (20 mg total) by mouth at bedtime. 01/04/22   Zehr, Laban Emperor, PA-C  nystatin cream (MYCOSTATIN) Apply topically 2 (two) times daily. 09/27/21   [provider]  pantoprazole (PROTONIX) 40 MG tablet Take 1 tablet (40 mg total) by mouth 2 (two) times daily. 09/14/22   Mauri Pole, MD    Current Outpatient Medications  Medication Sig Dispense Refill   calcium carbonate (OS-CAL) 1250 (500 Ca) MG chewable tablet Chew 1 tablet by mouth daily.     cholecalciferol (VITAMIN D3) 25 MCG (1000 UNIT) tablet Take 1,000 Units by mouth daily.     clonazePAM (KLONOPIN) 0.5 MG tablet TAKE 1 TABLET(0.5 MG) BY MOUTH TWICE DAILY AS NEEDED FOR ANXIETY 60 tablet 0   conjugated estrogens (PREMARIN) vaginal cream PLACE 1 APPLICATORFUL VAGINALLY 2 TIMES A WEEK 30 g 5   Efinaconazole 10 % SOLN Apply 1 application  topically daily. 8 mL 2   hydrocortisone 2.5 % cream Apply  topically daily.     ketoconazole (NIZORAL) 2 % cream Apply topically daily.     Misc Natural Products (FOCUSED MIND PO) Take by mouth. Daily vitamins     Multiple Vitamins-Minerals (ONE A DAY WOMEN 50 PLUS PO) Take 1 tablet by mouth daily.     Omega-3 Fatty Acids (FISH OIL OMEGA-3 PO) Take 1 capsule by mouth daily.     ondansetron (ZOFRAN) 4 MG tablet Take 1 tablet (4 mg total) by mouth every 8 (eight) hours as needed for nausea or vomiting. Take 30 minutes prior to taking prep and as needed every 8 hours 30 tablet 1   polyethylene glycol (MIRALAX / GLYCOLAX) 17 g packet Take 17 g by mouth as needed.     venlafaxine XR (EFFEXOR XR) 75 MG 24 hr capsule Take 1 capsule (75 mg total) by mouth daily with breakfast. 90 capsule 1   famotidine (PEPCID) 20 MG tablet Take 1 tablet (20 mg total) by mouth at bedtime. 90 tablet 3   nystatin cream (MYCOSTATIN) Apply topically 2 (two) times daily.     pantoprazole (PROTONIX) 40 MG tablet Take 1 tablet (40 mg total) by mouth 2 (two) times daily. 180 tablet 1   Current Facility-Administered Medications  Medication Dose Route Frequency Provider Last Rate Last Admin   0.9 %  sodium chloride infusion  500 mL Intravenous Continuous Montserrat Shek V, MD        Allergies as of 11/28/2022   (No Known Allergies)    Family History  Problem Relation Age of Onset   Heart disease Mother    Heart disease Father    Heart disease Maternal Grandmother    Heart disease Maternal Grandfather    Heart disease Paternal Grandmother    Heart disease Paternal Grandfather    Colon cancer Neg Hx    Esophageal cancer Neg Hx    Rectal cancer Neg Hx    Stomach cancer Neg Hx     Social History   Socioeconomic History   Marital status: Single    Spouse name: Not on file   Number of children: Not on file   Years of education: Not on file   Highest education level: Not on file  Occupational History   Not on file  Tobacco Use   Smoking status: Never   Smokeless  tobacco: Never  Vaping Use   Vaping Use: Never used  Substance and Sexual Activity   Alcohol use: No    Comment: no longer drinks, drank 6 glasses wine/week stopped in 2007   Drug use: No   Sexual activity: Not Currently  Other Topics Concern   Not on file  Social History Narrative   Not on file   Social Determinants of Health   Financial Resource Strain: Low Risk  (06/20/2022)   Overall Financial Resource Strain (CARDIA)    Difficulty of Paying Living Expenses: Not hard  at all  Food Insecurity: No Food Insecurity (06/20/2022)   Hunger Vital Sign    Worried About Running Out of Food in the Last Year: Never true    Ran Out of Food in the Last Year: Never true  Transportation Needs: No Transportation Needs (06/20/2022)   PRAPARE - Hydrologist (Medical): No    Lack of Transportation (Non-Medical): No  Physical Activity: Sufficiently Active (06/20/2022)   Exercise Vital Sign    Days of Exercise per Week: 4 days    Minutes of Exercise per Session: 60 min  Stress: No Stress Concern Present (06/20/2022)   [place]    Feeling of Stress : Not at all  Social Connections: [place] (06/20/2022)   Social Connection and Isolation Panel [NHANES]    Frequency of Communication with Friends and Family: More than three times a week    Frequency of Social Gatherings with Friends and Family: More than three times a week    Attends Religious Services: More than 4 times per year    Active Member of [employer] or Organizations: Yes    Attends Music therapist: More than 4 times per year    Marital Status: Married  Human resources officer Violence: Not At Risk (06/20/2022)   Humiliation, Afraid, Rape, and Kick questionnaire    Fear of Current or Ex-Partner: No    Emotionally Abused: No    Physically Abused: No    Sexually Abused: No    Review of Systems:  All other review of systems  negative except as mentioned in the HPI.  Physical Exam: Vital signs in last 24 hours: Blood Pressure (Abnormal) 148/89   Pulse 79   Temperature 98.9 F (37.2 C)   Respiration 15   Height '5\' 6"'$  (1.676 m)   Weight 170 lb (77.1 kg)   Oxygen Saturation 96%   Body Mass Index 27.44 kg/m  General:   Alert, NAD Lungs:  Clear .   Heart:  Regular rate and rhythm Abdomen:  Soft, nontender and nondistended. Neuro/Psych:  Alert and cooperative. Normal mood and affect. A and O x 3  Reviewed labs, radiology imaging, old records and pertinent past GI work up  Patient is appropriate for planned procedure(s) and anesthesia in an ambulatory setting   K. Denzil Magnuson , MD [phone]

## 2022-11-28 NOTE — Progress Notes (Signed)
Pt's states no medical or surgical changes since previsit or office visit. 

## 2022-11-28 NOTE — Progress Notes (Unsigned)
Called to room to assist during endoscopic procedure.  Patient ID and intended procedure confirmed with present staff. Received instructions for my participation in the procedure from the performing physician.  

## 2022-11-28 NOTE — Progress Notes (Unsigned)
Vss nad trans to pacu °

## 2022-11-28 NOTE — Progress Notes (Signed)
Bravo monitor #3 given to patient with instructions on use.

## 2022-11-28 NOTE — Op Note (Signed)
[facility] Patient Name: Lisa Spence Procedure Date: 11/28/2022 10:27 AM MRN: [id number] Endoscopist: Mauri Pole , MD, [id number] Age: 72 Referring MD:  Date of Birth: 1951-03-15 Gender: Female Account #: [id number] Procedure:                Colonoscopy Indications:              Surveillance: Personal history of adenomatous                            polyps on last colonoscopy 3 years ago, High risk                            colon cancer surveillance: Personal history of                            adenoma (10 mm or greater in size) Medicines:                Monitored Anesthesia Care Procedure:                Pre-Anesthesia Assessment:                           - Prior to the procedure, a History and Physical                            was performed, and patient medications and                            allergies were reviewed. The patient's tolerance of                            previous anesthesia was also reviewed. The risks                            and benefits of the procedure and the sedation                            options and risks were discussed with the patient.                            All questions were answered, and informed consent                            was obtained. Prior Anticoagulants: The patient has                            taken no anticoagulant or antiplatelet agents. ASA                            Grade Assessment: II - A patient with mild systemic                            disease. After reviewing the risks and benefits,  the patient was deemed in satisfactory condition to                            undergo the procedure.                           After obtaining informed consent, the colonoscope                            was passed under direct vision. Throughout the                            procedure, the patient's blood pressure, pulse, and                            oxygen saturations were  monitored continuously. The                            Olympus [id number]) Colonoscope was                            introduced through the anus and advanced to the the                            cecum, identified by appendiceal orifice and                            ileocecal valve. The colonoscopy was performed                            without difficulty. The patient tolerated the                            procedure well. The quality of the bowel                            preparation was good. The ileocecal valve,                            appendiceal orifice, and rectum were photographed. Scope In: 10:48:40 AM Scope Out: 11:02:26 AM Scope Withdrawal Time: 0 hours 10 minutes 20 seconds  Total Procedure Duration: 0 hours 13 minutes 46 seconds  Findings:                 The perianal and digital rectal examinations were                            normal.                           A single (solitary) three mm ulcer was found in the                            rectum. No bleeding was present. No stigmata of  recent bleeding were seen.                           A few small-mouthed diverticula were found in the                            sigmoid colon.                           Non-bleeding external and internal hemorrhoids were                            found during retroflexion. The hemorrhoids were                            medium-sized. Complications:            No immediate complications. Estimated Blood Loss:     Estimated blood loss was minimal. Impression:               - A single (solitary) stercoral ulcer in the rectum.                           - Diverticulosis in the sigmoid colon.                           - Non-bleeding external and internal hemorrhoids.                           - No specimens collected. Recommendation:           - Patient has a contact number available for                            emergencies. The signs and  symptoms of potential                            delayed complications were discussed with the                            patient. Return to normal activities tomorrow.                            Written discharge instructions were provided to the                            patient.                           - Resume previous diet.                           - Continue present medications.                           - Repeat colonoscopy in 5 years for surveillance.                           -  Use Benefiber 1 table 2-3 times daily with meals                           - Use Miralax 1 capful daily as needed to prevent                            constipation Mauri Pole, MD 11/28/2022 11:10:18 AM This report has been signed electronically.

## 2022-11-29 ENCOUNTER — Telehealth: Payer: Self-pay

## 2022-11-29 NOTE — Telephone Encounter (Signed)
  Follow up Call-     11/28/2022    9:38 AM  Call back number  Post procedure Call Back phone  # [phone]  Permission to leave phone message Yes     Patient questions:  Do you have a fever, pain , or abdominal swelling? No. Pain Score  0 *  Have you tolerated food without any problems? yes  Have you been able to return to your normal activities? yes  Do you have any questions about your discharge instructions: Diet   no Medications  no Follow up visit  No.  Do you have questions or concerns about your Care? No.  Actions: * If pain score is 4 or above: No action needed, pain <4.

## 2022-11-30 NOTE — Progress Notes (Signed)
Duplicate encounter

## 2022-12-05 ENCOUNTER — Other Ambulatory Visit: Payer: Self-pay

## 2022-12-05 DIAGNOSIS — K2100 Gastro-esophageal reflux disease with esophagitis, without bleeding: Secondary | ICD-10-CM

## 2022-12-07 ENCOUNTER — Encounter: Payer: Self-pay | Admitting: Gastroenterology

## 2022-12-07 ENCOUNTER — Telehealth: Payer: Self-pay | Admitting: Gastroenterology

## 2022-12-07 ENCOUNTER — Telehealth: Payer: Self-pay | Admitting: Nurse Practitioner

## 2022-12-07 NOTE — Telephone Encounter (Signed)
Caller Name: Reganne  Call back phone #: [phone]  Reason for Call: The referral to [facility] is not scheduling her and she has waited on hold for up to 25 minutes. She wants a different office. Please call when it is changed.

## 2022-12-07 NOTE — Telephone Encounter (Signed)
Called the patient back. No answer. No voicemail. Letter mailed to her on 12/05/22 with information about the esophageal manometry (swallowing test) which is scheduled for 02/21/23. The letter will explain the test and will instruct her to go to [facility] on 02/21/23 arriving at 12:00 noon.

## 2022-12-07 NOTE — Telephone Encounter (Signed)
Inbound call from patient regarding a swallowing test she was supposed have but she said she has not hear from [place] regarding test.Please advise

## 2022-12-07 NOTE — Telephone Encounter (Signed)
Referral sent to internal [facility] in [facility]. This location may have an earlier appt than [facility]

## 2022-12-20 ENCOUNTER — Encounter (HOSPITAL_COMMUNITY)
Admission: RE | Disposition: A | Discharge status: Home / Self Care | Payer: Self-pay | Source: Home / Self Care | Attending: Gastroenterology

## 2022-12-20 ENCOUNTER — Ambulatory Visit (HOSPITAL_COMMUNITY)
Admission: RE | Admit: 2022-12-20 | Discharge: 2022-12-20 | Disposition: A | Discharge status: Home / Self Care | Payer: Medicare HMO | Attending: Gastroenterology | Admitting: Gastroenterology

## 2022-12-20 ENCOUNTER — Ambulatory Visit (INDEPENDENT_AMBULATORY_CARE_PROVIDER_SITE_OTHER): Payer: Medicare HMO | Admitting: Family Medicine

## 2022-12-20 VITALS — BP 128/76 | HR 82 | Temp 98.0°F | Ht 66.0 in | Wt 180.6 lb

## 2022-12-20 DIAGNOSIS — M19042 Primary osteoarthritis, left hand: Secondary | ICD-10-CM | POA: Diagnosis not present

## 2022-12-20 DIAGNOSIS — M19041 Primary osteoarthritis, right hand: Secondary | ICD-10-CM | POA: Diagnosis not present

## 2022-12-20 DIAGNOSIS — R030 Elevated blood-pressure reading, without diagnosis of hypertension: Secondary | ICD-10-CM

## 2022-12-20 DIAGNOSIS — K219 Gastro-esophageal reflux disease without esophagitis: Secondary | ICD-10-CM | POA: Diagnosis not present

## 2022-12-20 DIAGNOSIS — R12 Heartburn: Secondary | ICD-10-CM | POA: Diagnosis not present

## 2022-12-20 HISTORY — PX: ESOPHAGEAL MANOMETRY: SHX5429

## 2022-12-20 SURGERY — MANOMETRY, ESOPHAGUS
Anesthesia: Choice

## 2022-12-20 MED ORDER — LIDOCAINE VISCOUS HCL 2 % MT SOLN
OROMUCOSAL | Status: AC
  Filled 2022-12-20: qty 15

## 2022-12-20 SURGICAL SUPPLY — 2 items
FACESHIELD LNG OPTICON STERILE (SAFETY) IMPLANT
GLOVE BIO SURGEON STRL SZ8 (GLOVE) ×2 IMPLANT

## 2022-12-20 NOTE — Assessment & Plan Note (Signed)
Blood pressure was initially mildly high, but then settled down to normal on repeat. I shared with Lisa Spence information regarding nonpharmacologic approaches to blood pressure and expected benefits of using various approaches. She should consider a DASH diet, sodium restriction, regular exercise and modest weight loss. I would hold off on starting medication for this at the current time.

## 2022-12-20 NOTE — Assessment & Plan Note (Signed)
Discussed nature of hand arthritis. At the current time, we would focus on pain relief. I recommend she consider hot soaks (possibly a paraffin bath) and using Tylenol arthritis for discomfort.

## 2022-12-20 NOTE — Progress Notes (Signed)
[facility] [address] [place] [address] Dept: [phone] Dept Fax: [phone]  Office Visit  Subjective:    Patient ID: Lisa Spence, female    DOB: 09/27/1950, 72 y.o..   MRN: [id number]  Chief Complaint  Patient presents with   Hand Pain    C/o having pain in both hands/finger x 2-3 weeks.  No OTC meds taken. Also having elevated BP.     History of Present Illness:  Patient is in today for noting issues with her blood pressure fluctuating over the past weeks. She did get a home BP cuff and notes it had shown values of 160s/90s. She has had some mildly elevated blood pressures this winter. She is not having any symptoms associated with these.  Lisa Spence also notes she has pain and stiffness in her fingers. She notes some bony changes around the finger joints. She is unsure about how to mange these.  Past Medical History: Patient Active Problem List   Diagnosis Date Noted   Weak urinary stream 10/24/2022   Elevated blood pressure reading 05/02/2022   Belching 01/04/2022   Hyperlipidemia 12/06/2021   Elevated LFTs 11/08/2021   Bursitis of right hip 05/27/2021   Osteoporosis 03/10/2020   Hot flashes due to menopause 05/02/2019   Anxiety 04/25/2019   GERD (gastroesophageal reflux disease) 04/25/2019   Constipation 04/25/2019   Vaginal dryness, menopausal 04/25/2019   Left trimalleolar fracture, closed, initial encounter 07/09/2017   Past Surgical History:  Procedure Laterality Date   ABDOMINAL HYSTERECTOMY     COLONOSCOPY     ORIF ANKLE FRACTURE Left 07/09/2017   Procedure: OPEN REDUCTION INTERNAL FIXATION (ORIF) ANKLE FRACTURE;  Surgeon: Nicholes Stairs, MD;  Location: [facility];  Service: Orthopedics;  Laterality: Left;  90 mins   Family History  Problem Relation Age of Onset   Heart disease Mother    Heart disease Father    Heart disease Maternal Grandmother    Heart disease Maternal Grandfather     Heart disease Paternal Grandmother    Heart disease Paternal Grandfather    Colon cancer Neg Hx    Esophageal cancer Neg Hx    Rectal cancer Neg Hx    Stomach cancer Neg Hx    Outpatient Medications Prior to Visit  Medication Sig Dispense Refill   calcium carbonate (OS-CAL) 1250 (500 Ca) MG chewable tablet Chew 1 tablet by mouth daily.     cholecalciferol (VITAMIN D3) 25 MCG (1000 UNIT) tablet Take 1,000 Units by mouth daily.     clonazePAM (KLONOPIN) 0.5 MG tablet TAKE 1 TABLET(0.5 MG) BY MOUTH TWICE DAILY AS NEEDED FOR ANXIETY 60 tablet 0   conjugated estrogens (PREMARIN) vaginal cream PLACE 1 APPLICATORFUL VAGINALLY 2 TIMES A WEEK 30 g 5   Efinaconazole 10 % SOLN Apply 1 application  topically daily. 8 mL 2   famotidine (PEPCID) 20 MG tablet Take 1 tablet (20 mg total) by mouth at bedtime. 90 tablet 3   hydrocortisone 2.5 % cream Apply topically daily.     ketoconazole (NIZORAL) 2 % cream Apply topically daily.     Misc Natural Products (FOCUSED MIND PO) Take by mouth. Daily vitamins     Multiple Vitamins-Minerals (ONE A DAY WOMEN 50 PLUS PO) Take 1 tablet by mouth daily.     nystatin cream (MYCOSTATIN) Apply topically 2 (two) times daily.     Omega-3 Fatty Acids (FISH OIL OMEGA-3 PO) Take 1 capsule by mouth daily.  pantoprazole (PROTONIX) 40 MG tablet Take 1 tablet (40 mg total) by mouth 2 (two) times daily. 180 tablet 1   polyethylene glycol (MIRALAX / GLYCOLAX) 17 g packet Take 17 g by mouth as needed.     venlafaxine XR (EFFEXOR XR) 75 MG 24 hr capsule Take 1 capsule (75 mg total) by mouth daily with breakfast. 90 capsule 1   ondansetron (ZOFRAN) 4 MG tablet Take 1 tablet (4 mg total) by mouth every 8 (eight) hours as needed for nausea or vomiting. Take 30 minutes prior to taking prep and as needed every 8 hours 30 tablet 1   No facility-administered medications prior to visit.   No Known Allergies   Objective:   Today's Vitals   12/20/22 1330 12/20/22 1358  BP: 136/80  128/76  Pulse: 82   Temp: 98 F (36.7 C)   TempSrc: Temporal   SpO2: 97%   Weight: 180 lb 9.6 oz (81.9 kg)   Height: 5\' 6"  (1.676 m)    Body mass index is 29.15 kg/m.   General: Well developed, well nourished. No acute distress. Extremities: There are bony nodular changes around the DIP and PIP joints bilaterally.    No swelling to the joints is noted. ROM is fairly ewell preserved. The MCP joitns   appear normal. Psych: Alert and oriented. Normal mood and affect.  There are no preventive care reminders to display for this patient.    Assessment & Plan:   Problem List Items Addressed This Visit       Musculoskeletal and Integument   Primary osteoarthritis of both hands    Discussed nature of hand arthritis. At the current time, we would focus on pain relief. I recommend she consider hot soaks (possibly a paraffin bath) and using Tylenol arthritis for discomfort.        Other   Elevated blood pressure reading - Primary    Blood pressure was initially mildly high, but then settled down to normal on repeat. I shared with Lisa Spence information regarding nonpharmacologic approaches to blood pressure and expected benefits of using various approaches. She should consider a DASH diet, sodium restriction, regular exercise and modest weight loss. I would hold off on starting medication for this at the current time.       Return if symptoms worsen or fail to improve.   Lisa Salter, MD

## 2022-12-21 ENCOUNTER — Encounter: Payer: Medicare HMO | Admitting: Urology

## 2022-12-21 ENCOUNTER — Other Ambulatory Visit: Payer: Self-pay | Admitting: Nurse Practitioner

## 2022-12-21 ENCOUNTER — Encounter (HOSPITAL_COMMUNITY): Payer: Self-pay | Admitting: Gastroenterology

## 2022-12-21 DIAGNOSIS — F419 Anxiety disorder, unspecified: Secondary | ICD-10-CM

## 2022-12-21 DIAGNOSIS — R12 Heartburn: Secondary | ICD-10-CM

## 2022-12-21 NOTE — Telephone Encounter (Signed)
Refill request for  Clonazepam 0.5 mg LR 08/16/22,#60, 0 rf LOV 10/24/22 FOV  01/23/23  Please review and advise.  Thanks.  Dm/cma

## 2022-12-27 DIAGNOSIS — L603 Nail dystrophy: Secondary | ICD-10-CM | POA: Diagnosis not present

## 2022-12-27 DIAGNOSIS — L304 Erythema intertrigo: Secondary | ICD-10-CM | POA: Diagnosis not present

## 2022-12-27 DIAGNOSIS — D1801 Hemangioma of skin and subcutaneous tissue: Secondary | ICD-10-CM | POA: Diagnosis not present

## 2022-12-27 DIAGNOSIS — L821 Other seborrheic keratosis: Secondary | ICD-10-CM | POA: Diagnosis not present

## 2022-12-27 DIAGNOSIS — L814 Other melanin hyperpigmentation: Secondary | ICD-10-CM | POA: Diagnosis not present

## 2022-12-27 DIAGNOSIS — D229 Melanocytic nevi, unspecified: Secondary | ICD-10-CM | POA: Diagnosis not present

## 2022-12-27 DIAGNOSIS — L578 Other skin changes due to chronic exposure to nonionizing radiation: Secondary | ICD-10-CM | POA: Diagnosis not present

## 2022-12-28 ENCOUNTER — Encounter: Payer: Medicare HMO | Admitting: Urology

## 2022-12-29 ENCOUNTER — Other Ambulatory Visit: Payer: Self-pay | Admitting: Family Medicine

## 2022-12-29 DIAGNOSIS — K219 Gastro-esophageal reflux disease without esophagitis: Secondary | ICD-10-CM

## 2022-12-29 DIAGNOSIS — N958 Other specified menopausal and perimenopausal disorders: Secondary | ICD-10-CM

## 2023-01-05 ENCOUNTER — Other Ambulatory Visit: Payer: Self-pay | Admitting: Gastroenterology

## 2023-01-17 ENCOUNTER — Ambulatory Visit: Payer: Medicare HMO | Admitting: Urology

## 2023-01-17 ENCOUNTER — Encounter: Payer: Self-pay | Admitting: Urology

## 2023-01-17 VITALS — BP 137/79 | HR 90 | Ht 66.0 in | Wt 172.0 lb

## 2023-01-17 DIAGNOSIS — R3912 Poor urinary stream: Secondary | ICD-10-CM

## 2023-01-17 LAB — URINALYSIS, ROUTINE W REFLEX MICROSCOPIC
Glucose, UA: NEGATIVE
Leukocytes,UA: NEGATIVE
Nitrite, UA: NEGATIVE
Protein,UA: NEGATIVE
RBC, UA: NEGATIVE
Specific Gravity, UA: 1.03 (ref 1.005–1.030)
Urobilinogen, Ur: 0.2 mg/dL (ref 0.2–1.0)
pH, UA: 5 (ref 5.0–7.5)

## 2023-01-17 LAB — BLADDER SCAN AMB NON-IMAGING

## 2023-01-17 NOTE — Progress Notes (Signed)
Assessment: 1. Weak urinary stream      Plan: I discussed with the patient today that her bladder is emptying completely and her urinalysis is entirely negative and that I did not think further evaluation or treatment was warranted given the lack of significant bother.  She will let us know if this changes in the future.  Follow-up as needed  Chief Complaint: week urinary stream  History of Present Illness:  Lisa Spence is a 72 y.o. female who is seen in consultation from [facility], NP for evaluation of LUTS. Patient reports that over the last 4 to 6 months she began noticing a change in her urinary stream with it not being as forceful or strong.  She denies any dysuria increased frequency or urgency.  Urinalysis neg.   Overall there is very little bother associated with the symptoms however it is different and she just wanted to make sure there was nothing to be concerned about. PVR today = 0  Past Medical History:  Past Medical History:  Diagnosis Date   Allergy    Anxiety    Fatty liver    GERD (gastroesophageal reflux disease)    Internal hemorrhoids    Osteoporosis     Past Surgical History:  Past Surgical History:  Procedure Laterality Date   ABDOMINAL HYSTERECTOMY     COLONOSCOPY     ESOPHAGEAL MANOMETRY N/A 12/20/2022   Procedure: ESOPHAGEAL MANOMETRY (EM);  Surgeon: Napoleon Form, MD;  Location: [facility];  Service: Gastroenterology;  Laterality: N/A;   ORIF ANKLE FRACTURE Left 07/09/2017   Procedure: OPEN REDUCTION INTERNAL FIXATION (ORIF) ANKLE FRACTURE;  Surgeon: Yolonda Kida, MD;  Location: [facility] OR;  Service: Orthopedics;  Laterality: Left;  90 mins    Allergies:  No Known Allergies  Family History:  Family History  Problem Relation Age of Onset   Heart disease Mother    Heart disease Father    Heart disease Maternal Grandmother    Heart disease Maternal Grandfather    Heart disease Paternal Grandmother    Heart disease  Paternal Grandfather    Colon cancer Neg Hx    Esophageal cancer Neg Hx    Rectal cancer Neg Hx    Stomach cancer Neg Hx     Social History:  Social History   Tobacco Use   Smoking status: Never   Smokeless tobacco: Never  Vaping Use   Vaping Use: Never used  Substance Use Topics   Alcohol use: No    Comment: no longer drinks, drank 6 glasses wine/week stopped in 2007   Drug use: No    Review of symptoms:  Constitutional:  Negative for unexplained weight loss, night sweats, fever, chills ENT:  Negative for nose bleeds, sinus pain, painful swallowing CV:  Negative for chest pain, shortness of breath, exercise intolerance, palpitations, loss of consciousness Resp:  Negative for cough, wheezing, shortness of breath GI:  Negative for nausea, vomiting, diarrhea, bloody stools GU:  Positives noted in HPI; otherwise negative for gross hematuria, dysuria, urinary incontinence Neuro:  Negative for seizures, poor balance, limb weakness, slurred speech Psych:  Negative for lack of energy, depression, anxiety Endocrine:  Negative for polydipsia, polyuria, symptoms of hypoglycemia (dizziness, hunger, sweating) Hematologic:  Negative for anemia, purpura, petechia, prolonged or excessive bleeding, use of anticoagulants  Allergic:  Negative for difficulty breathing or choking as a result of exposure to anything; no shellfish allergy; no allergic response (rash/itch) to materials, foods  Physical exam: BP 137/79  Pulse 90   Ht  (1.676 m)   Wt 172 lb (78 kg)   BMI 27.76 kg/m  GENERAL APPEARANCE:  Well appearing, well developed, well nourished, NAD   Results: UA neg

## 2023-01-23 ENCOUNTER — Encounter: Payer: Medicare HMO | Admitting: Nurse Practitioner

## 2023-01-31 ENCOUNTER — Encounter: Payer: Self-pay | Admitting: Gastroenterology

## 2023-01-31 ENCOUNTER — Encounter: Payer: Medicare HMO | Admitting: Nurse Practitioner

## 2023-01-31 ENCOUNTER — Ambulatory Visit: Payer: Medicare HMO | Admitting: Gastroenterology

## 2023-01-31 VITALS — BP 128/82 | HR 75 | Ht 66.0 in | Wt 180.0 lb

## 2023-01-31 DIAGNOSIS — K2100 Gastro-esophageal reflux disease with esophagitis, without bleeding: Secondary | ICD-10-CM

## 2023-01-31 DIAGNOSIS — R142 Eructation: Secondary | ICD-10-CM | POA: Diagnosis not present

## 2023-01-31 NOTE — Progress Notes (Signed)
Chief Complaint:    GERD with erosive esophagitis, discussed Transoral Incisionless Fundoplication (TIF)  HPI:     Patient is a 72 y.o. female with a history of HTN, HLD, osteoporosis, GERD with erosive esophagitis, referred to me by Dr. Lavon Paganini for evaluation of possible antireflux intervention with Transoral Incisionless Fundoplication (TIF) with a goal to stop or significantly reduce acid suppression therapy.  Reflux symptoms started in early 2023.  Predominantly burping only.  No heartburn, regurgitation.  Was seen in the GI clinic by Doug Sou again in 12/2021.  Eventually completed EGD in 11/2022 which is notable for erosive esophagitis.  She has since increased her pantoprazole to 40 mg  bid and added famotidine 20 mg qhs (was previously taking on demand) over the last week and has noticed some improvement.     GERD history: -Index symptoms: Belching. No HB. No dysphagia -Exacerbating features: None identified -Medications trialed: Pantoprazole, Famotidine, Tums -Current medications: Pantoprazole 40 mg bid, Pepcid 20 mg qhs -Complications: Erosive Esophagitis  GERD evaluation: -Last EGD: 11/28/2022 as below -Barium esophagram: None -Esophageal Manometry: 11/2022: Normal -pH/Impedance: None -Bravo: None  Endoscopic History: - 11/28/2022: EGD: LA Grade C erosive esophagitis, Hill grade 2 valve.  Mild gastritis (path benign), multiple 5-12 mm fundic gland polyps.    Review of systems:     No chest pain, no SOB, no fevers, no urinary sx   Past Medical History:  Diagnosis Date   Allergy    Anxiety    Fatty liver    GERD (gastroesophageal reflux disease)    Internal hemorrhoids    Osteoporosis     Patient's surgical history, family medical history, social history, medications and allergies were all reviewed in Epic    Current Outpatient Medications  Medication Sig Dispense Refill   calcium carbonate (OS-CAL) 1250 (500 Ca) MG chewable tablet Chew 1 tablet by mouth daily.      cholecalciferol (VITAMIN D3) 25 MCG (1000 UNIT) tablet Take 1,000 Units by mouth daily.     clonazePAM (KLONOPIN) 0.5 MG tablet TAKE 1 TABLET(0.5 MG) BY MOUTH TWICE DAILY AS NEEDED FOR ANXIETY 60 tablet 0   conjugated estrogens (PREMARIN) vaginal cream INSERT 1 APPLICATORFUL VAGINALLY 2 TIMES A WEEK 30 g 5   Efinaconazole 10 % SOLN Apply 1 application  topically daily. 8 mL 2   famotidine (PEPCID) 20 MG tablet TAKE 1 TABLET(20 MG) BY MOUTH AT BEDTIME 90 tablet 3   hydrocortisone 2.5 % cream Apply topically daily.     ketoconazole (NIZORAL) 2 % cream Apply topically daily.     Misc Natural Products (FOCUSED MIND PO) Take by mouth. Daily vitamins     Multiple Vitamins-Minerals (ONE A DAY WOMEN 50 PLUS PO) Take 1 tablet by mouth daily.     nystatin cream (MYCOSTATIN) Apply topically 2 (two) times daily.     Omega-3 Fatty Acids (FISH OIL OMEGA-3 PO) Take 1 capsule by mouth daily.     pantoprazole (PROTONIX) 40 MG tablet TAKE 1 TABLET(40 MG) BY MOUTH DAILY AS NEEDED 90 tablet 0   polyethylene glycol (MIRALAX / GLYCOLAX) 17 g packet Take 17 g by mouth as needed.     venlafaxine XR (EFFEXOR XR) 75 MG 24 hr capsule Take 1 capsule (75 mg total) by mouth daily with breakfast. 90 capsule 1   No current facility-administered medications for this visit.    Physical Exam:     BP 128/82   Pulse 75   Ht 5\' 6"  (1.676 m)   Wt  180 lb (81.6 kg)   BMI 29.05 kg/m   GENERAL:  Pleasant female in NAD PSYCH: : Cooperative, normal affect EMusculoskeletal:  Normal muscle tone, normal strength NEURO: Alert and oriented x 3, no focal neurologic deficits   IMPRESSION and PLAN:    1) GERD with erosive esophagitis 2) Belching 72 year old female with GERD with significant erosive esophagitis on index EGD earlier this year.  We had a long conversation today regarding the pathophysiology of GERD.  We discussed ongoing medical management vs antireflux surgical options, specifically discussing TIF.  Plan for  the following:  - Continue current regimen of pantoprazole 40 mg bid and Pepcid 20 mg qhs - Repeat EGD in June to evaluate for healing of erosive esophagitis - She would like to think about whether or not to proceed with TIF based on recent response to therapy and repeat EGD findings. - We discussed the risks, benefits, alternatives of TIF at length to include postoperative dietary modifications   -Will continue to follow-up with Dr. Lavon Paganini for her other GI issues   The indications, risks, and benefits of EGD were explained to the patient in detail. Risks include but are not limited to bleeding, perforation, adverse reaction to medications, and cardiopulmonary compromise. Sequelae include but are not limited to the possibility of surgery, hospitalization, and mortality. The patient verbalized understanding and wished to proceed. All questions answered, referred to scheduler. Further recommendations pending results of the exam.            Shellia Cleverly ,DO, FACG 01/31/2023, 2:34 PM  Cc: Dr. Lavon Paganini

## 2023-01-31 NOTE — Patient Instructions (Addendum)
You have been scheduled for an endoscopy. Please follow written instructions given to you at your visit today. If you use inhalers (even only as needed), please bring them with you on the day of your procedure. ______________________________________________________  If your blood pressure at your visit was 140/90 or greater, please contact your primary care physician to follow up on this.  _______________________________________________________  If you are age 28 or older, your body mass index should be between 23-30. Your Body mass index is 29.05 kg/m. If this is out of the aforementioned range listed, please consider follow up with your Primary Care Provider.  ________________________________________________________  The [facility] providers would like to encourage you to use [facility] to communicate with providers for non-urgent requests or questions.  Due to long hold times on the telephone, sending your provider a message by [facility] may be a faster and more efficient way to get a response.  Please allow 48 business hours for a response.  Please remember that this is for non-urgent requests.   Due to recent changes in healthcare laws, you may see the results of your imaging and laboratory studies on MyChart before your provider has had a chance to review them.  We understand that in some cases there may be results that are confusing or concerning to you. Not all laboratory results come back in the same time frame and the provider may be waiting for multiple results in order to interpret others.  Please give [place] 48 hours in order for your provider to thoroughly review all the results before contacting the office for clarification of your results.    Thank you for choosing me and [facility].  Doristine Locks, D.O.  _______________________________________________________

## 2023-02-07 ENCOUNTER — Encounter: Payer: Medicare HMO | Admitting: Nurse Practitioner

## 2023-02-13 ENCOUNTER — Ambulatory Visit (INDEPENDENT_AMBULATORY_CARE_PROVIDER_SITE_OTHER): Payer: Medicare HMO | Admitting: Nurse Practitioner

## 2023-02-13 ENCOUNTER — Encounter: Payer: Self-pay | Admitting: Nurse Practitioner

## 2023-02-13 VITALS — BP 130/80 | Temp 97.5°F | Ht 66.0 in | Wt 182.0 lb

## 2023-02-13 DIAGNOSIS — F419 Anxiety disorder, unspecified: Secondary | ICD-10-CM | POA: Diagnosis not present

## 2023-02-13 DIAGNOSIS — N951 Menopausal and female climacteric states: Secondary | ICD-10-CM | POA: Diagnosis not present

## 2023-02-13 DIAGNOSIS — K219 Gastro-esophageal reflux disease without esophagitis: Secondary | ICD-10-CM | POA: Diagnosis not present

## 2023-02-13 DIAGNOSIS — E785 Hyperlipidemia, unspecified: Secondary | ICD-10-CM

## 2023-02-13 DIAGNOSIS — M810 Age-related osteoporosis without current pathological fracture: Secondary | ICD-10-CM | POA: Diagnosis not present

## 2023-02-13 DIAGNOSIS — Z0000 Encounter for general adult medical examination without abnormal findings: Secondary | ICD-10-CM | POA: Insufficient documentation

## 2023-02-13 DIAGNOSIS — R7989 Other specified abnormal findings of blood chemistry: Secondary | ICD-10-CM

## 2023-02-13 DIAGNOSIS — R030 Elevated blood-pressure reading, without diagnosis of hypertension: Secondary | ICD-10-CM

## 2023-02-13 MED ORDER — VENLAFAXINE HCL ER 150 MG PO CP24: 150.0000 mg | ORAL_CAPSULE | Freq: Every day | ORAL | 0 refills | Status: DC

## 2023-02-13 MED ORDER — CLONAZEPAM 0.5 MG PO TABS: 0.5000 mg | ORAL_TABLET | Freq: Two times a day (BID) | ORAL | 0 refills | Status: DC | PRN

## 2023-02-13 NOTE — Progress Notes (Unsigned)
BP 130/80   Temp (!) 97.5 F (36.4 C)   Ht 5\' 6"  (1.676 m)   Wt 182 lb (82.6 kg)   BMI 29.38 kg/m    Subjective:    Patient ID: Lisa Spence, female    DOB: 1951-06-03, 72 y.o.   MRN: [id number]  CC: Chief Complaint  Patient presents with   Annual Exam    Patient is not fasting, night sweats, referral for a therapist, weight concerns    HPI: Lisa Spence is a 72 y.o. female presenting on 02/13/2023 for comprehensive medical examination. Current medical complaints include: anxiety, night sweats  She states that over the last month, her night sweats have worsened. She has been taking the venlafaxine every day. She has the [facility] on and is using a fan at night. She is concerned for maybe an infection or another cause of her night sweats worsening. She denies fevers, cough, and unexplained weight loss.   She states that her anxiety is still ongoing. She has 2 family members who are dealing with cancer. She is interested in talking with a therapist and would like the name of 3 or 4 that she can look up. She also thinks her venlafaxine may need to be increased.   Menopausal Symptoms: yes - hot flashes  Depression and Anxiety Screen done today and results listed below:     02/13/2023    8:33 AM 06/20/2022    9:11 AM 06/20/2022    9:10 AM 12/06/2021    9:22 AM 11/08/2021    9:57 AM  Depression screen PHQ 2/9  Decreased Interest 0 1 0 0 0  Down, Depressed, Hopeless 0 1 0 0 0  PHQ - 2 Score 0 2 0 0 0  Altered sleeping 1   0 1  Tired, decreased energy 0   0 1  Change in appetite 0   0 1  Feeling bad or failure about yourself  0   0 0  Trouble concentrating 0   0 1  Moving slowly or fidgety/restless 0   0 1  Suicidal thoughts 0   0 0  PHQ-9 Score 1   0 5  Difficult doing work/chores Not difficult at all   Not difficult at all Somewhat difficult      02/13/2023    8:34 AM 12/06/2021    9:22 AM 11/08/2021    9:58 AM 05/27/2021   11:08 AM  GAD 7 : Generalized Anxiety Score   Nervous, Anxious, on Edge 1 2 2 2   Control/stop worrying 0 1 1 1   Worry too much - different things 0 0 0 0  Trouble relaxing 0 1 2 1   Restless 0 1 0 0  Easily annoyed or irritable 1 1 1 1   Afraid - awful might happen 0 0 0 0  Total GAD 7 Score 2 6 6 5   Anxiety Difficulty Somewhat difficult  Somewhat difficult Not difficult at all    The patient does not have a history of falls. I did not complete a risk assessment for falls. A plan of care for falls was not documented.   Past Medical History:  Past Medical History:  Diagnosis Date   Allergy    Anxiety    Fatty liver    GERD (gastroesophageal reflux disease)    Internal hemorrhoids    Osteoporosis     Surgical History:  Past Surgical History:  Procedure Laterality Date   ABDOMINAL HYSTERECTOMY     COLONOSCOPY  ESOPHAGEAL MANOMETRY N/A 12/20/2022   Procedure: ESOPHAGEAL MANOMETRY (EM);  Surgeon: Napoleon Form, MD;  Location: [facility];  Service: Gastroenterology;  Laterality: N/A;   ORIF ANKLE FRACTURE Left 07/09/2017   Procedure: OPEN REDUCTION INTERNAL FIXATION (ORIF) ANKLE FRACTURE;  Surgeon: Yolonda Kida, MD;  Location[facility] OR;  Service: Orthopedics;  Laterality: Left;  90 mins    Medications:  Current Outpatient Medications on File Prior to Visit  Medication Sig   calcium carbonate (OS-CAL) 1250 (500 Ca) MG chewable tablet Chew 1 tablet by mouth daily.   cholecalciferol (VITAMIN D3) 25 MCG (1000 UNIT) tablet Take 1,000 Units by mouth daily.   conjugated estrogens (PREMARIN) vaginal cream INSERT 1 APPLICATORFUL VAGINALLY 2 TIMES A WEEK   Efinaconazole 10 % SOLN Apply 1 application  topically daily.   famotidine (PEPCID) 20 MG tablet TAKE 1 TABLET(20 MG) BY MOUTH AT BEDTIME   hydrocortisone 2.5 % cream Apply topically daily.   ketoconazole (NIZORAL) 2 % cream Apply topically daily.   Misc Natural Products (FOCUSED MIND PO) Take by mouth. Daily vitamins   Multiple Vitamins-Minerals (ONE A DAY WOMEN  50 PLUS PO) Take 1 tablet by mouth daily.   nystatin cream (MYCOSTATIN) Apply topically 2 (two) times daily.   Omega-3 Fatty Acids (FISH OIL OMEGA-3 PO) Take 1 capsule by mouth daily.   pantoprazole (PROTONIX) 40 MG tablet TAKE 1 TABLET(40 MG) BY MOUTH DAILY AS NEEDED (Patient taking differently: Take 40 mg by mouth 2 (two) times daily.)   polyethylene glycol (MIRALAX / GLYCOLAX) 17 g packet Take 17 g by mouth as needed.   No current facility-administered medications on file prior to visit.    Allergies:  No Known Allergies  Social History:  Social History   Socioeconomic History   Marital status: Single    Spouse name: Not on file   Number of children: Not on file   Years of education: Not on file   Highest education level: Bachelor's degree (e.g., BA, AB, BS)  Occupational History   Not on file  Tobacco Use   Smoking status: Never   Smokeless tobacco: Never  Vaping Use   Vaping Use: Never used  Substance and Sexual Activity   Alcohol use: No    Comment: no longer drinks, drank 6 glasses wine/week stopped in 2007   Drug use: No   Sexual activity: Not Currently  Other Topics Concern   Not on file  Social History Narrative   Not on file   Social Determinants of Health   Financial Resource Strain: Low Risk  (12/20/2022)   Overall Financial Resource Strain (CARDIA)    Difficulty of Paying Living Expenses: Not hard at all  Food Insecurity: No Food Insecurity (12/20/2022)   Hunger Vital Sign    Worried About Running Out of Food in the Last Year: Never true    Ran Out of Food in the Last Year: Never true  Transportation Needs: No Transportation Needs (12/20/2022)   PRAPARE - Administrator, Civil Service (Medical): No    Lack of Transportation (Non-Medical): No  Physical Activity: Sufficiently Active (12/20/2022)   Exercise Vital Sign    Days of Exercise per Week: 7 days    Minutes of Exercise per Session: 60 min  Stress: Stress Concern Present (12/20/2022)    [employer] of Occupational Health - Occupational Stress Questionnaire    Feeling of Stress : To some extent  Social Connections: Moderately Integrated (12/20/2022)   Social Connection and Isolation Panel [  NHANES]    Frequency of Communication with Friends and Family: More than three times a week    Frequency of Social Gatherings with Friends and Family: Three times a week    Attends Religious Services: More than 4 times per year    Active Member of Clubs or Organizations: Yes    Attends Banker Meetings: More than 4 times per year    Marital Status: Never married  Intimate Partner Violence: Not At Risk (06/20/2022)   Humiliation, Afraid, Rape, and Kick questionnaire    Fear of Current or Ex-Partner: No    Emotionally Abused: No    Physically Abused: No    Sexually Abused: No   Social History   Tobacco Use  Smoking Status Never  Smokeless Tobacco Never   Social History   Substance and Sexual Activity  Alcohol Use No   Comment: no longer drinks, drank 6 glasses wine/week stopped in 2007    Family History:  Family History  Problem Relation Age of Onset   Heart disease Mother    Heart disease Father    Heart disease Maternal Grandmother    Heart disease Maternal Grandfather    Heart disease Paternal Grandmother    Heart disease Paternal Grandfather    Colon cancer Neg Hx    Esophageal cancer Neg Hx    Rectal cancer Neg Hx    Stomach cancer Neg Hx     Past medical history, surgical history, medications, allergies, family history and social history reviewed with patient today and changes made to appropriate areas of the chart.   Review of Systems  Constitutional:  Positive for malaise/fatigue. Negative for fever.       Hot flashes   HENT: Negative.    Eyes: Negative.   Respiratory: Negative.    Cardiovascular: Negative.   Gastrointestinal: Negative.   Genitourinary: Negative.   Musculoskeletal: Negative.   Skin: Negative.   Neurological: Negative.    Psychiatric/Behavioral:  The patient is nervous/anxious.    All other ROS negative except what is listed above and in the HPI.      Objective:    BP 130/80   Temp (!) 97.5 F (36.4 C)   Ht 5\' 6"  (1.676 m)   Wt 182 lb (82.6 kg)   BMI 29.38 kg/m   Wt Readings from Last 3 Encounters:  02/13/23 182 lb (82.6 kg)  01/31/23 180 lb (81.6 kg)  01/17/23 172 lb (78 kg)    Physical Exam Vitals and nursing note reviewed.  Constitutional:      General: She is not in acute distress.    Appearance: Normal appearance.  HENT:     Head: Normocephalic and atraumatic.     Right Ear: Tympanic membrane, ear canal and external ear normal.     Left Ear: Tympanic membrane, ear canal and external ear normal.  Eyes:     Conjunctiva/sclera: Conjunctivae normal.  Cardiovascular:     Rate and Rhythm: Normal rate and regular rhythm.     Pulses: Normal pulses.     Heart sounds: Normal heart sounds.  Pulmonary:     Effort: Pulmonary effort is normal.     Breath sounds: Normal breath sounds.  Abdominal:     Palpations: Abdomen is soft.     Tenderness: There is no abdominal tenderness.  Musculoskeletal:        General: Normal range of motion.     Cervical back: Normal range of motion and neck supple.     Right lower  leg: No edema.     Left lower leg: No edema.  Lymphadenopathy:     Cervical: No cervical adenopathy.  Skin:    General: Skin is warm and dry.  Neurological:     General: No focal deficit present.     Mental Status: She is alert and oriented to person, place, and time.     Coordination: Coordination normal.     Gait: Gait normal.  Psychiatric:        Mood and Affect: Mood normal.        Behavior: Behavior normal.        Thought Content: Thought content normal.        Judgment: Judgment normal.     Results for orders placed or performed in visit on 01/17/23  Urinalysis, Routine w reflex microscopic  Result Value Ref Range   Specific Gravity, UA 1.030 1.005 - 1.030   pH, UA  5.0 5.0 - 7.5   Color, UA Yellow Yellow   Appearance Ur Clear Clear   Leukocytes,UA Negative Negative   Protein,UA Negative Negative/Trace   Glucose, UA Negative Negative   Ketones, UA Trace (A) Negative   RBC, UA Negative Negative   Bilirubin, UA CANCELED    Urobilinogen, Ur 0.2 0.2 - 1.0 mg/dL   Nitrite, UA Negative Negative  BLADDER SCAN AMB NON-IMAGING  Result Value Ref Range   Scan Result 0ml       Assessment & Plan:   Problem List Items Addressed This Visit       Cardiovascular and Mediastinum   Hot flashes Spence to menopause    Chronic, ongoing.  She has had an increase in hot flashes over the last month. Most likely still Spence to menopause. Will increase venlafaxine to 150mg  daily. Discussed other medications including gabapentin, clonidine, and estrogen to help treat symptoms if not improving. Follow-up in 3 months or sooner with concerns.         Digestive   GERD (gastroesophageal reflux disease)    Chronic, stable. She is following with GI who has her on protonix 40mg  BID and pepcid 20mg  daily. Continue collaboration and recommendations from GI.         Musculoskeletal and Integument   Osteoporosis    Updated DEXA scan on 09/19/23 showed a T-score of -2.8.  Continue prolia injections, calcium, vitamin D, and exercise. Will recheck DEXA scan in 2 years.        Other   Anxiety    Chronic, not controlled. Her anxiety has slightly worsened since last visit. Will increase venlafaxine to 150 mg daily. I recommend she also consider seeing a therapist. Gave her the phone number to [facility].       Relevant Medications   clonazePAM (KLONOPIN) 0.5 MG tablet   venlafaxine XR (EFFEXOR-XR) 150 MG 24 hr capsule   Elevated LFTs    She went to GI and had further testing done and was shown that this is most likely Spence to fatty liver.  Fibrosis score was a 0.  Discussed diet and nutrition.  Check CMP today.       Relevant Orders    Comprehensive metabolic panel   Hyperlipidemia    Chronic, improving.  She has changed her diet, decrease the amount she is that she is eating and has started exercising.  Check CMP, CBC, lipid panel today.       Relevant Orders   CBC   Comprehensive metabolic panel   Lipid panel  Routine general medical examination at a health care facility - Primary    Health maintenance reviewed and updated. Discussed nutrition, exercise. Check CMP, CBC today. Follow-up 1 year.          Follow up plan: Return in about 3 months (around 05/16/2023) for anxiety, night sweats.   LABORATORY TESTING:  - Pap smear: not applicable  IMMUNIZATIONS:   - Tdap: Tetanus vaccination status reviewed: last tetanus booster within 10 years. - Influenza: Postponed to flu season - Pneumovax: Up to date - Prevnar: Up to date - HPV: Not applicable - Zostavax vaccine:  discussed today- she will schedule at her pharmacy  SCREENING: -Mammogram: Up to date  - Colonoscopy: Up to date  - Bone Density: Up to date   PATIENT COUNSELING:   Advised to take 1 mg of folate supplement per day if capable of pregnancy.   Sexuality: Discussed sexually transmitted diseases, partner selection, use of condoms, avoidance of unintended pregnancy  and contraceptive alternatives.   Advised to avoid cigarette smoking.  I discussed with the patient that most people either abstain from alcohol or drink within safe limits (<=14/week and <=4 drinks/occasion for males, <=7/weeks and <= 3 drinks/occasion for females) and that the risk for alcohol disorders and other health effects rises proportionally with the number of drinks per week and how often a drinker exceeds daily limits.  Discussed cessation/primary prevention of drug use and availability of treatment for abuse.   Diet: Encouraged to adjust caloric intake to maintain  or achieve ideal body weight, to reduce intake of dietary saturated fat and total fat, to limit sodium intake  by avoiding high sodium foods and not adding table salt, and to maintain adequate dietary potassium and calcium preferably from fresh fruits, vegetables, and low-fat dairy products.    stressed the importance of regular exercise  Injury prevention: Discussed safety belts, safety helmets, smoke detector, smoking near bedding or upholstery.   Dental health: Discussed importance of regular tooth brushing, flossing, and dental visits.    NEXT PREVENTATIVE PHYSICAL Spence IN 1 YEAR. Return in about 3 months (around 05/16/2023) for anxiety, night sweats.

## 2023-02-13 NOTE — Patient Instructions (Addendum)
It was great to see you!  Come back for your labs fasting at your convenience.   I have refilled klonopin.   Therapist for [facility]: [phone]. I don't see a list of the providers.   Let's follow-up in 3 months, sooner if you have concerns.  If a referral was placed today, you will be contacted for an appointment. Please note that routine referrals can sometimes take up to 3-4 weeks to process. Please call our office if you haven't heard anything after this time frame.  Take care,  Rodman Pickle, NP

## 2023-02-14 ENCOUNTER — Telehealth: Payer: Self-pay

## 2023-02-14 NOTE — Assessment & Plan Note (Signed)
Chronic, ongoing.  She has had an increase in hot flashes over the last month. Most likely still due to menopause. Will increase venlafaxine to 150mg  daily. Discussed other medications including gabapentin, clonidine, and estrogen to help treat symptoms if not improving. Follow-up in 3 months or sooner with concerns.

## 2023-02-14 NOTE — Assessment & Plan Note (Signed)
She went to GI and had further testing done and was shown that this is most likely due to fatty liver.  Fibrosis score was a 0.  Discussed diet and nutrition.  Check CMP today.

## 2023-02-14 NOTE — Assessment & Plan Note (Signed)
Chronic, stable. She is following with GI who has her on protonix 40mg  BID and pepcid 20mg  daily. Continue collaboration and recommendations from GI.

## 2023-02-14 NOTE — Assessment & Plan Note (Signed)
Chronic, not controlled. Her anxiety has slightly worsened since last visit. Will increase venlafaxine to 150 mg daily. I recommend she also consider seeing a therapist. Gave her the phone number to [facility].

## 2023-02-14 NOTE — Assessment & Plan Note (Signed)
Health maintenance reviewed and updated. Discussed nutrition, exercise. Check CMP, CBC today. Follow-up 1 year.   

## 2023-02-14 NOTE — Assessment & Plan Note (Signed)
Updated DEXA scan on 09/19/23 showed a T-score of -2.8.  Continue prolia injections, calcium, vitamin D, and exercise. Will recheck DEXA scan in 2 years.

## 2023-02-14 NOTE — Assessment & Plan Note (Signed)
Chronic, improving.  She has changed her diet, decrease the amount she is that she is eating and has started exercising.  Check CMP, CBC, lipid panel today.

## 2023-02-15 ENCOUNTER — Encounter: Payer: Self-pay | Admitting: Nurse Practitioner

## 2023-02-15 ENCOUNTER — Other Ambulatory Visit (INDEPENDENT_AMBULATORY_CARE_PROVIDER_SITE_OTHER): Payer: Medicare HMO

## 2023-02-15 ENCOUNTER — Ambulatory Visit (INDEPENDENT_AMBULATORY_CARE_PROVIDER_SITE_OTHER): Payer: Medicare HMO | Admitting: Nurse Practitioner

## 2023-02-15 VITALS — BP 140/80 | HR 94 | Temp 97.3°F | Ht 66.0 in | Wt 182.6 lb

## 2023-02-15 DIAGNOSIS — R7989 Other specified abnormal findings of blood chemistry: Secondary | ICD-10-CM

## 2023-02-15 DIAGNOSIS — E785 Hyperlipidemia, unspecified: Secondary | ICD-10-CM

## 2023-02-15 DIAGNOSIS — T148XXA Other injury of unspecified body region, initial encounter: Secondary | ICD-10-CM | POA: Diagnosis not present

## 2023-02-15 LAB — LIPID PANEL
Cholesterol: 192 mg/dL (ref 0–200)
HDL: 53.7 mg/dL (ref >39.00)
LDL Cholesterol: 112 mg/dL — ABNORMAL HIGH (ref 0–99)
NonHDL: 138.25
Total CHOL/HDL Ratio: 4
Triglycerides: 129 mg/dL (ref 0.0–149.0)
VLDL: 25.8 mg/dL (ref 0.0–40.0)

## 2023-02-15 LAB — COMPREHENSIVE METABOLIC PANEL
ALT: 67 U/L — ABNORMAL HIGH (ref 0–35)
AST: 41 U/L — ABNORMAL HIGH (ref 0–37)
Albumin: 4.6 g/dL (ref 3.5–5.2)
Alkaline Phosphatase: 58 U/L (ref 39–117)
BUN: 16 mg/dL (ref 6–23)
CO2: 27 mEq/L (ref 19–32)
Calcium: 9.2 mg/dL (ref 8.4–10.5)
Chloride: 105 mEq/L (ref 96–112)
Creatinine, Ser: 0.74 mg/dL (ref 0.40–1.20)
GFR: 82.11 mL/min (ref >60.00)
Glucose, Bld: 91 mg/dL (ref 70–99)
Potassium: 4.3 mEq/L (ref 3.5–5.1)
Sodium: 141 mEq/L (ref 135–145)
Total Bilirubin: 0.4 mg/dL (ref 0.2–1.2)
Total Protein: 6.9 g/dL (ref 6.0–8.3)

## 2023-02-15 LAB — CBC
HCT: 43.5 % (ref 36.0–46.0)
Hemoglobin: 14.4 g/dL (ref 12.0–15.0)
MCHC: 33.1 g/dL (ref 30.0–36.0)
MCV: 90.5 fl (ref 78.0–100.0)
Platelets: 255 10*3/uL (ref 150.0–400.0)
RBC: 4.81 Mil/uL (ref 3.87–5.11)
RDW: 13.2 % (ref 11.5–15.5)
WBC: 8.1 10*3/uL (ref 4.0–10.5)

## 2023-02-15 NOTE — Patient Instructions (Signed)
It was great to see you!  Start tylenol 3 times a day and voltaren gel 4 times a day.  Start using heat and ice for 20 minutes at a time.   Do some gentle stretches with pointing and flexing your foot.   Let's follow-up if your symptoms worsen or don't improve.   Take care,  Rodman Pickle, NP

## 2023-02-15 NOTE — Telephone Encounter (Signed)
Noted  

## 2023-02-15 NOTE — Progress Notes (Signed)
Acute Office Visit  Subjective:     Patient ID: Lisa Spence, female    DOB: [birth year], 72 y.o.   MRN: [id number]  Chief Complaint  Patient presents with   Leg Pain    Right leg pain and back of knee    HPI Patient is in today for pain in the back of her right knee and leg. This started 3 days ago after she had a muscle cramp in her leg. She is active and plays pickleball and walks frequently. She denies injury. She states that the pain has been getting worse over the last few days. Walking, stretching her foot, and twisting her knee makes the pain worse. She has been taking tylenol which is not helping. She has not noticed any pain, swelling, or redness of her skin.   ROS See pertinent positives and negatives per HPI.     Objective:    BP (!) 140/80   Pulse 94   Temp (!) 97.3 F (36.3 C)   Ht 5\' 6"  (1.676 m)   Wt 182 lb 9.6 oz (82.8 kg)   SpO2 96%   BMI 29.47 kg/m    Physical Exam Vitals and nursing note reviewed.  Constitutional:      General: She is not in acute distress.    Appearance: Normal appearance.  HENT:     Head: Normocephalic.  Eyes:     Conjunctiva/sclera: Conjunctivae normal.  Pulmonary:     Effort: Pulmonary effort is normal.  Musculoskeletal:        General: Tenderness (right posterior calf) present. No swelling.     Cervical back: Normal range of motion.  Skin:    General: Skin is warm.     Findings: No erythema.  Neurological:     General: No focal deficit present.     Mental Status: She is alert and oriented to person, place, and time.  Psychiatric:        Mood and Affect: Mood normal.        Behavior: Behavior normal.        Thought Content: Thought content normal.        Judgment: Judgment normal.     Results for orders placed or performed in visit on 02/15/23  Lipid panel  Result Value Ref Range   Cholesterol 192 0 - 200 mg/dL   Triglycerides 409.8 0.0 - 149.0 mg/dL   HDL 11.91 >47.82 mg/dL   VLDL 95.6 0.0 - 21.3 mg/dL    LDL Cholesterol 086 (H) 0 - 99 mg/dL   Total CHOL/HDL Ratio 4    NonHDL 138.25   Comprehensive metabolic panel  Result Value Ref Range   Sodium 141 135 - 145 mEq/L   Potassium 4.3 3.5 - 5.1 mEq/L   Chloride 105 96 - 112 mEq/L   CO2 27 19 - 32 mEq/L   Glucose, Bld 91 70 - 99 mg/dL   BUN 16 6 - 23 mg/dL   Creatinine, Ser 5.78 0.40 - 1.20 mg/dL   Total Bilirubin 0.4 0.2 - 1.2 mg/dL   Alkaline Phosphatase 58 39 - 117 U/L   AST 41 (H) 0 - 37 U/L   ALT 67 (H) 0 - 35 U/L   Total Protein 6.9 6.0 - 8.3 g/dL   Albumin 4.6 3.5 - 5.2 g/dL   GFR 46.96 >29.52 mL/min   Calcium 9.2 8.4 - 10.5 mg/dL  CBC  Result Value Ref Range   WBC 8.1 4.0 - 10.5 K/uL   RBC 4.81  3.87 - 5.11 Mil/uL   Platelets 255.0 150.0 - 400.0 K/uL   Hemoglobin 14.4 12.0 - 15.0 g/dL   HCT 52.8 41.3 - 24.4 %   MCV 90.5 78.0 - 100.0 fl   MCHC 33.1 30.0 - 36.0 g/dL   RDW 01.0 27.2 - 53.6 %        Assessment & Plan:   Problem List Items Addressed This Visit   None Visit Diagnoses     Muscle strain    -  Primary   Start tylenol 1,000mg  TID prn pain. Can alternate ice and heat. Elevated leg while sitting. Do gentle stretches with foot. F/U if not improving.       No orders of the defined types were placed in this encounter.   Return if symptoms worsen or fail to improve.  Gerre Scull, NP

## 2023-02-16 ENCOUNTER — Ambulatory Visit: Payer: Medicare HMO | Admitting: Podiatry

## 2023-02-16 DIAGNOSIS — L84 Corns and callosities: Secondary | ICD-10-CM | POA: Diagnosis not present

## 2023-02-16 DIAGNOSIS — M79675 Pain in left toe(s): Secondary | ICD-10-CM | POA: Diagnosis not present

## 2023-02-16 DIAGNOSIS — M79674 Pain in right toe(s): Secondary | ICD-10-CM

## 2023-02-16 DIAGNOSIS — B351 Tinea unguium: Secondary | ICD-10-CM

## 2023-02-16 NOTE — Progress Notes (Signed)
Subjective: Chief Complaint  Patient presents with   Nail Problem    Nail trim    Callouses    Callus trim      72 year old female presents the office today for concerns of thick, elongated toenails that she can has difficulty trimming herself for nail fungus.  She is still using the medication for nail fungus and it is doing a lot better.   She is asking about getting an x-ray of her ankle in the fuutre. No isuses but just to check. She wants to do it later this year or next visit.   Objective: AAO x3, NAD DP/PT pulses palpable bilaterally, CRT less than 3 seconds Nails are hypertrophic, dystrophic, brittle, discolored, elongated 10. No surrounding redness or drainage. Tenderness nails 1-5 bilaterally.  There is minimal callus formation on the medial first MPJ without any underlying ulceration drainage or signs of infection. There is no pain identified to the left ankle. No pain with calf compression, swelling, warmth, erythema  Assessment: Symptomatic onychomycosis, hyperkeratotic lesions, Achilles tendonitis  Plan: -All treatment options discussed with the patient including all alternatives, risks, complications.  -Sharply to the nails x10 without any complications or bleeding.  Discussed topical medication.  Continue Jublia -Sharply debrided the mild hyperkeratotic lesion x2 without any complications or bleeding -Will get x-ray left ankle next appointment. -Patient encouraged to call the office with any questions, concerns, change in symptoms.   Return in about 3 months (around 05/19/2023) for nail/callus trim, ankle x-ray.  Vivi Barrack DPM

## 2023-02-20 ENCOUNTER — Encounter: Payer: Self-pay | Admitting: Nurse Practitioner

## 2023-02-20 ENCOUNTER — Telehealth: Payer: Self-pay

## 2023-02-20 ENCOUNTER — Ambulatory Visit (INDEPENDENT_AMBULATORY_CARE_PROVIDER_SITE_OTHER): Payer: Medicare HMO | Admitting: Nurse Practitioner

## 2023-02-20 ENCOUNTER — Ambulatory Visit (HOSPITAL_COMMUNITY)
Admission: RE | Admit: 2023-02-20 | Discharge: 2023-02-20 | Disposition: A | Discharge status: Home / Self Care | Payer: Medicare HMO | Source: Ambulatory Visit | Attending: Nurse Practitioner | Admitting: Nurse Practitioner

## 2023-02-20 VITALS — BP 132/84 | HR 83 | Temp 97.7°F | Ht 66.0 in | Wt 182.0 lb

## 2023-02-20 DIAGNOSIS — R2241 Localized swelling, mass and lump, right lower limb: Secondary | ICD-10-CM | POA: Diagnosis not present

## 2023-02-20 MED ORDER — TRAMADOL HCL 50 MG PO TABS: 50.0000 mg | ORAL_TABLET | Freq: Two times a day (BID) | ORAL | 0 refills | Status: AC | PRN

## 2023-02-20 NOTE — Progress Notes (Signed)
Right lower extremity venous study completed.   Preliminary results relayed to Rodman Pickle, NP.  Please see CV Procedures for preliminary results.  Hollister Wessler, RVT  3:22 PM 02/20/23

## 2023-02-20 NOTE — Progress Notes (Signed)
   Acute Office Visit  Subjective:     Patient ID: Lisa Spence, female    DOB: [birth year], 72 y.o.   MRN: [id number]  Chief Complaint  Patient presents with   Joint Swelling    Right ankle swelling with pain    HPI Patient is in today for ongoing right calf pain. She also noticed ankle swelling started last night. She has been taking tylenol and using voltaren gel with minimal relief. She has also been propping her leg up when sitting. She states that her ankle started swelling last night. She has some pain in her ankle and states it is painful when she walks. She denies chest pain and shortness of breath.   ROS See pertinent positives and negatives per HPI.     Objective:    BP 132/84 (BP Location: Right Arm)   Pulse 83   Temp 97.7 F (36.5 C)   Ht 5\' 6"  (1.676 m)   Wt 182 lb (82.6 kg)   SpO2 97%   BMI 29.38 kg/m    Physical Exam Vitals and nursing note reviewed.  Constitutional:      General: She is not in acute distress.    Appearance: Normal appearance.  HENT:     Head: Normocephalic.  Eyes:     Conjunctiva/sclera: Conjunctivae normal.  Pulmonary:     Effort: Pulmonary effort is normal.  Musculoskeletal:        General: Swelling (right ankle) and tenderness (right calf) present.     Cervical back: Normal range of motion.  Skin:    General: Skin is warm.  Neurological:     General: No focal deficit present.     Mental Status: She is alert and oriented to person, place, and time.  Psychiatric:        Mood and Affect: Mood normal.        Behavior: Behavior normal.        Thought Content: Thought content normal.        Judgment: Judgment normal.       Assessment & Plan:   Problem List Items Addressed This Visit       Other   Localized swelling of right lower leg - Primary    She still having ongoing pain to her right calf along with swelling in her right ankle.  She again denies initial injury.  There appears to be some swelling to her calf as  well.  No redness, warmth.  Concern for possible DVT.  Will check stat ultrasound.  Tylenol and Voltaren is not helping with pain, will have her start tramadol 50 mg every 12 hours as needed.  PDMP reviewed.  Continue elevating foot while sitting.  She can also start wearing compression socks to her legs during the day.  Follow-up based on results of ultrasound.      Relevant Orders   VAS [place] LOWER EXTREMITY VENOUS (DVT)    Meds ordered this encounter  Medications   traMADol (ULTRAM) 50 MG tablet    Sig: Take 1 tablet (50 mg total) by mouth every 12 (twelve) hours as needed for up to 5 days for severe pain.    Dispense:  10 tablet    Refill:  0    Return if symptoms worsen or fail to improve, for based on ultrasound results.  Gerre Scull, NP

## 2023-02-20 NOTE — Telephone Encounter (Signed)
[  3:24 PM] McElwee, Lauren Can you let Lisa Spence know her ultrasound was negative for a blood clot, but did show a bakers cyst. This is most likely what is causing her pain and swelling. I recommend wearing the compression socks, I would start ibuprofen 600mg  3 times a day with food and the tramadol I ordered today. The bakers cyst is a colllection of fluid behind her knee . If not better in a few days, then she can go to orthopedics. I know she is going out of town om saturday, so she can go sooner if she wants, but typically this goes away with time.    I called and spoke with patient notified her of above message and to take the Ibuprofen for 1 week. She had no further questions or conerns

## 2023-02-20 NOTE — Assessment & Plan Note (Signed)
She still having ongoing pain to her right calf along with swelling in her right ankle.  She again denies initial injury.  There appears to be some swelling to her calf as well.  No redness, warmth.  Concern for possible DVT.  Will check stat ultrasound.  Tylenol and Voltaren is not helping with pain, will have her start tramadol 50 mg every 12 hours as needed.  PDMP reviewed.  Continue elevating foot while sitting.  She can also start wearing compression socks to her legs during the day.  Follow-up based on results of ultrasound.

## 2023-02-20 NOTE — Patient Instructions (Signed)
It was great to see you!  Start tramadol twice a day as needed for pain. You can still take tylenol as needed.   Keep using ice/heat and elevate your foot.  I have ordered an ultrasound of your leg, they will call to schedule.   Let's follow-up if your symptoms worsen or don't improve  Take care,  Rodman Pickle, NP

## 2023-03-03 ENCOUNTER — Other Ambulatory Visit: Payer: Self-pay | Admitting: Nurse Practitioner

## 2023-03-03 DIAGNOSIS — N958 Other specified menopausal and perimenopausal disorders: Secondary | ICD-10-CM

## 2023-03-05 NOTE — Telephone Encounter (Signed)
Requesting: PREMARIN VAGINAL CREAM 30GM  Last Visit: 02/20/2023 Next Visit: 05/16/2023 Last Refill: 12/29/2022  Please Advise

## 2023-03-17 ENCOUNTER — Encounter: Payer: Self-pay | Admitting: Certified Registered Nurse Anesthetist

## 2023-03-20 ENCOUNTER — Telehealth: Payer: Self-pay | Admitting: Gastroenterology

## 2023-03-20 NOTE — Telephone Encounter (Signed)
error 

## 2023-03-22 ENCOUNTER — Encounter: Payer: Medicare HMO | Admitting: Gastroenterology

## 2023-03-26 ENCOUNTER — Encounter: Payer: Medicare HMO | Admitting: Gastroenterology

## 2023-03-26 DIAGNOSIS — B359 Dermatophytosis, unspecified: Secondary | ICD-10-CM | POA: Diagnosis not present

## 2023-03-26 DIAGNOSIS — L603 Nail dystrophy: Secondary | ICD-10-CM | POA: Diagnosis not present

## 2023-03-26 DIAGNOSIS — B351 Tinea unguium: Secondary | ICD-10-CM | POA: Diagnosis not present

## 2023-03-26 DIAGNOSIS — L659 Nonscarring hair loss, unspecified: Secondary | ICD-10-CM | POA: Diagnosis not present

## 2023-03-26 DIAGNOSIS — L304 Erythema intertrigo: Secondary | ICD-10-CM | POA: Diagnosis not present

## 2023-03-30 DIAGNOSIS — L603 Nail dystrophy: Secondary | ICD-10-CM | POA: Diagnosis not present

## 2023-03-30 DIAGNOSIS — L659 Nonscarring hair loss, unspecified: Secondary | ICD-10-CM | POA: Diagnosis not present

## 2023-04-03 ENCOUNTER — Telehealth: Payer: Self-pay | Admitting: Gastroenterology

## 2023-04-03 NOTE — Telephone Encounter (Signed)
Spoke with patient at length. Pt has a history of fatty liver. Liver work up completed last year and [place] as well. Pt is wanting to follow up on this to make sure nothing has changed. Pt was seen by dermatology and they performed lab results (requesting results for your review), pt is concerned about her persistently elevated LFTs at this time. Pt denies any alcohol use, no herbal supplements or any new medications. Pt also wanted me to mention that she has lost her leg and arm hair in the last 60 days, she has been advised to f/u with dermatology as scheduled. Pt wanted to know if we could run a full panel of labs, I advised pt that she had complete liver work up last year and I would send this to you to see what you recommended for further evaluation of persistent LFTs. Pt is scheduled for EGD with Dr. Barron Alvine later this month. Please advise, thanks.

## 2023-04-03 NOTE — Telephone Encounter (Signed)
Inbound call patient , she is having a Endoscopy on 7/24 and she is requesting to have some blood work done before that, she said she has some health conditions and want to make sure everything is good. Please call patient once order is sent (if).Thanks

## 2023-04-03 NOTE — Telephone Encounter (Signed)
Results reviewed by Dr. Lavon Paganini, mild elevation of LFTs likely due to fatty liver. Exercise and steady/consistent weight loss of 1-2 lbs per week. Routine office visit, non-urgent. Pt scheduled for next available appt with Dr. Lavon Paganini on 08/14/23 at 8:50 am. Recommendations sent to patient via MyChart.

## 2023-04-03 NOTE — Telephone Encounter (Signed)
I will review labs once available.  Please schedule office follow-up visit with me next available appointment.  Thank you

## 2023-04-04 NOTE — Telephone Encounter (Signed)
Called and spoke with patient regarding recommendations as outlined below. Pt is aware of her follow up appt with Dr. Lavon Paganini. Pt verbalized understanding and had no concerns at the end of the call.

## 2023-04-16 ENCOUNTER — Other Ambulatory Visit: Payer: Self-pay

## 2023-04-16 ENCOUNTER — Telehealth: Payer: Self-pay | Admitting: Gastroenterology

## 2023-04-16 NOTE — Telephone Encounter (Signed)
Called and spoke with patient, she is aware that we will proceed as scheduled on 04/18/23.

## 2023-04-16 NOTE — Telephone Encounter (Signed)
Returned call to patient. Pt states that her appt for EGD had to be rescheduled and she did not have her instructions with her. Per EGD instructions patient is supposed to hold NSAIDs 1 week prior to procedures. Pt states that on Friday, 04/13/23 she had to take Ibuprofen 400 mg for her back pain. Pt has not had any NSAIDs since. Can she still proceed with EGD on 04/19/23 or should she reschedule?

## 2023-04-16 NOTE — Telephone Encounter (Signed)
PT is calling to speak to someone regarding lab order as well as discuss EGD questions. Please advise.

## 2023-04-17 ENCOUNTER — Other Ambulatory Visit: Payer: Self-pay | Admitting: Nurse Practitioner

## 2023-04-17 DIAGNOSIS — L659 Nonscarring hair loss, unspecified: Secondary | ICD-10-CM | POA: Diagnosis not present

## 2023-04-17 DIAGNOSIS — L304 Erythema intertrigo: Secondary | ICD-10-CM | POA: Diagnosis not present

## 2023-04-18 ENCOUNTER — Encounter: Payer: Self-pay | Admitting: Gastroenterology

## 2023-04-18 ENCOUNTER — Ambulatory Visit (AMBULATORY_SURGERY_CENTER): Payer: Medicare HMO | Admitting: Gastroenterology

## 2023-04-18 ENCOUNTER — Other Ambulatory Visit: Payer: Self-pay | Admitting: Nurse Practitioner

## 2023-04-18 VITALS — BP 111/67 | HR 58 | Temp 97.5°F | Resp 12 | Ht 66.0 in | Wt 180.0 lb

## 2023-04-18 DIAGNOSIS — K760 Fatty (change of) liver, not elsewhere classified: Secondary | ICD-10-CM | POA: Diagnosis not present

## 2023-04-18 DIAGNOSIS — F419 Anxiety disorder, unspecified: Secondary | ICD-10-CM | POA: Diagnosis not present

## 2023-04-18 DIAGNOSIS — K317 Polyp of stomach and duodenum: Secondary | ICD-10-CM | POA: Diagnosis not present

## 2023-04-18 DIAGNOSIS — D131 Benign neoplasm of stomach: Secondary | ICD-10-CM

## 2023-04-18 DIAGNOSIS — K2100 Gastro-esophageal reflux disease with esophagitis, without bleeding: Secondary | ICD-10-CM | POA: Diagnosis not present

## 2023-04-18 MED ORDER — VENLAFAXINE HCL ER 150 MG PO CP24: 150.0000 mg | ORAL_CAPSULE | Freq: Every day | ORAL | 0 refills | Status: DC

## 2023-04-18 MED ORDER — SODIUM CHLORIDE 0.9 % IV SOLN
500.0000 mL | INTRAVENOUS | Status: DC
Start: 2023-04-18 — End: 2023-04-18

## 2023-04-18 NOTE — Progress Notes (Signed)
Pt's states no medical or surgical changes since previsit or office visit. 

## 2023-04-18 NOTE — Patient Instructions (Signed)
YOU HAD AN ENDOSCOPIC PROCEDURE TODAY AT THE [facility]:   Refer to the procedure report that was given to you for any specific questions about what was found during the examination.  If the procedure report does not answer your questions, please call your gastroenterologist to clarify.  If you requested that your care partner not be given the details of your procedure findings, then the procedure report has been included in a sealed envelope for you to review at your convenience later.  YOU SHOULD EXPECT: Some feelings of bloating in the abdomen. Passage of more gas than usual.  Walking can help get rid of the air that was put into your GI tract during the procedure and reduce the bloating. If you had a lower endoscopy (such as a colonoscopy or flexible sigmoidoscopy) you may notice spotting of blood in your stool or on the toilet paper. If you underwent a bowel prep for your procedure, you may not have a normal bowel movement for a few days.  Please Note:  You might notice some irritation and congestion in your nose or some drainage.  This is from the oxygen used during your procedure.  There is no need for concern and it should clear up in a day or so.  SYMPTOMS TO REPORT IMMEDIATELY:  Following upper endoscopy (EGD)  Vomiting of blood or coffee ground material  New chest pain or pain under the shoulder blades  Painful or persistently difficult swallowing  New shortness of breath  Fever of 100F or higher  Black, tarry-looking stools  For urgent or emergent issues, a gastroenterologist can be reached at any hour by calling [phone]. Do not use MyChart messaging for urgent concerns.    DIET:  We do recommend a small meal at first, but then you may proceed to your regular diet.  Drink plenty of fluids but you should avoid alcoholic beverages for 24 hours.  ACTIVITY:  You should plan to take it easy for the rest of today and you should NOT DRIVE or use heavy machinery until  tomorrow (because of the sedation medicines used during the test).    FOLLOW UP: Our staff will call the number listed on your records the next business day following your procedure.  We will call around 7:15- 8:00 am to check on you and address any questions or concerns that you may have regarding the information given to you following your procedure. If we do not reach you, we will leave a message.     SIGNATURES/CONFIDENTIALITY: You and/or your care partner have signed paperwork which will be entered into your electronic medical record.  These signatures attest to the fact that that the information above on your After Visit Summary has been reviewed and is understood.  Full responsibility of the confidentiality of this discharge information lies with you and/or your care-partner. 

## 2023-04-18 NOTE — Progress Notes (Signed)
GASTROENTEROLOGY PROCEDURE H&P NOTE   Primary Care Physician: Gerre Scull, NP    Reason for Procedure:   GERD w/ erosive esophagitis, pre-operative evaluation  Plan:    EGD  Patient is appropriate for endoscopic procedure(s) in the ambulatory ([facility]) setting.  The nature of the procedure, as well as the risks, benefits, and alternatives were carefully and thoroughly reviewed with the patient. Ample time for discussion and questions allowed. The patient understood, was satisfied, and agreed to proceed.     HPI: Lisa Spence is a 72 y.o. female who presents for EGD to evaluate for improvement of known erosive esophagitis along with pre-operative assessment for antireflux surgery. Currently treated with Protonix 40 mg BID and Pepcid 20 mg at bedtime.     Endoscopic History: - 11/28/2022: EGD: LA Grade C erosive esophagitis, Hill grade 2 valve.  Mild gastritis (path benign), multiple 5-12 mm fundic gland polyps.   Past Medical History:  Diagnosis Date   Allergy    Anxiety    Fatty liver    GERD (gastroesophageal reflux disease)    Internal hemorrhoids    Osteoporosis     Past Surgical History:  Procedure Laterality Date   ABDOMINAL HYSTERECTOMY     COLONOSCOPY     ESOPHAGEAL MANOMETRY N/A 12/20/2022   Procedure: ESOPHAGEAL MANOMETRY (EM);  Surgeon: Napoleon Form, MD;  Location: [facility];  Service: Gastroenterology;  Laterality: N/A;   ORIF ANKLE FRACTURE Left 07/09/2017   Procedure: OPEN REDUCTION INTERNAL FIXATION (ORIF) ANKLE FRACTURE;  Surgeon: Yolonda Kida, MD;  Location: [facility] OR;  Service: Orthopedics;  Laterality: Left;  90 mins    Prior to Admission medications   Medication Sig Start Date End Date Taking? Authorizing Provider  calcium carbonate (OS-CAL) 1250 (500 Ca) MG chewable tablet Chew 1 tablet by mouth daily.    [provider]  cholecalciferol (VITAMIN D3) 25 MCG (1000 UNIT) tablet Take 1,000 Units by mouth daily.     [provider]  clonazePAM (KLONOPIN) 0.5 MG tablet Take 1 tablet (0.5 mg total) by mouth 2 (two) times daily as needed for anxiety. 02/13/23   McElwee, Lauren A, NP  Efinaconazole 10 % SOLN Apply 1 application  topically daily. 09/04/22   Vivi Barrack, DPM  famotidine (PEPCID) 20 MG tablet TAKE 1 TABLET(20 MG) BY MOUTH AT BEDTIME 01/05/23   Nandigam, Eleonore Chiquito, MD  hydrocortisone 2.5 % cream Apply topically daily. 10/06/21   [provider]  ketoconazole (NIZORAL) 2 % cream Apply topically daily. 10/06/21   [provider]  Misc Natural Products (FOCUSED MIND PO) Take by mouth. Daily vitamins    [provider]  Multiple Vitamins-Minerals (ONE A DAY WOMEN 50 PLUS PO) Take 1 tablet by mouth daily.    [provider]  nystatin cream (MYCOSTATIN) Apply topically 2 (two) times daily. 09/27/21   [provider]  Omega-3 Fatty Acids (FISH OIL OMEGA-3 PO) Take 1 capsule by mouth daily.    [provider]  pantoprazole (PROTONIX) 40 MG tablet TAKE 1 TABLET(40 MG) BY MOUTH DAILY AS NEEDED Patient taking differently: Take 40 mg by mouth 2 (two) times daily. 12/29/22   McElwee, Lauren A, NP  polyethylene glycol (MIRALAX / GLYCOLAX) 17 g packet Take 17 g by mouth as needed.    [provider]  PREMARIN vaginal cream INSERT 1 APPLICATORFUL VAGINALLY 2 TIMES A WEEK 03/05/23   McElwee, Lauren A, NP  venlafaxine XR (EFFEXOR-XR) 150 MG 24 hr capsule Take  1 capsule (150 mg total) by mouth daily with breakfast. 02/13/23   McElwee, Jake Church, NP    Current Outpatient Medications  Medication Sig Dispense Refill   calcium carbonate (OS-CAL) 1250 (500 Ca) MG chewable tablet Chew 1 tablet by mouth daily.     cholecalciferol (VITAMIN D3) 25 MCG (1000 UNIT) tablet Take 1,000 Units by mouth daily.     clonazePAM (KLONOPIN) 0.5 MG tablet Take 1 tablet (0.5 mg total) by mouth 2 (two) times daily as needed for anxiety. 60 tablet 0   Efinaconazole 10 % SOLN  Apply 1 application  topically daily. 8 mL 2   famotidine (PEPCID) 20 MG tablet TAKE 1 TABLET(20 MG) BY MOUTH AT BEDTIME 90 tablet 3   hydrocortisone 2.5 % cream Apply topically daily.     ketoconazole (NIZORAL) 2 % cream Apply topically daily.     Misc Natural Products (FOCUSED MIND PO) Take by mouth. Daily vitamins     Multiple Vitamins-Minerals (ONE A DAY WOMEN 50 PLUS PO) Take 1 tablet by mouth daily.     nystatin cream (MYCOSTATIN) Apply topically 2 (two) times daily.     Omega-3 Fatty Acids (FISH OIL OMEGA-3 PO) Take 1 capsule by mouth daily.     pantoprazole (PROTONIX) 40 MG tablet TAKE 1 TABLET(40 MG) BY MOUTH DAILY AS NEEDED (Patient taking differently: Take 40 mg by mouth 2 (two) times daily.) 90 tablet 0   polyethylene glycol (MIRALAX / GLYCOLAX) 17 g packet Take 17 g by mouth as needed.     PREMARIN vaginal cream INSERT 1 APPLICATORFUL VAGINALLY 2 TIMES A WEEK 30 g 5   venlafaxine XR (EFFEXOR-XR) 150 MG 24 hr capsule Take 1 capsule (150 mg total) by mouth daily with breakfast. 90 capsule 0   No current facility-administered medications for this visit.    Allergies as of 04/18/2023   (No Known Allergies)    Family History  Problem Relation Age of Onset   Heart disease Mother    Heart disease Father    Heart disease Maternal Grandmother    Heart disease Maternal Grandfather    Heart disease Paternal Grandmother    Heart disease Paternal Grandfather    Colon cancer Neg Hx    Esophageal cancer Neg Hx    Rectal cancer Neg Hx    Stomach cancer Neg Hx     Social History   Socioeconomic History   Marital status: Single    Spouse name: Not on file   Number of children: Not on file   Years of education: Not on file   Highest education level: Bachelor's degree (e.g., BA, AB, BS)  Occupational History   Not on file  Tobacco Use   Smoking status: Never   Smokeless tobacco: Never  Vaping Use   Vaping status: Never Used  Substance and Sexual Activity   Alcohol use: No     Comment: no longer drinks, drank 6 glasses wine/week stopped in 2007   Drug use: No   Sexual activity: Not Currently  Other Topics Concern   Not on file  Social History Narrative   Not on file   Social Determinants of Health   Financial Resource Strain: Low Risk  (12/20/2022)   Overall Financial Resource Strain (CARDIA)    Difficulty of Paying Living Expenses: Not hard at all  Food Insecurity: No Food Insecurity (12/20/2022)   Hunger Vital Sign    Worried About Running Out of Food in the Last Year: Never true    Ran  Out of Food in the Last Year: Never true  Transportation Needs: No Transportation Needs (12/20/2022)   PRAPARE - Administrator, Civil Service (Medical): No    Lack of Transportation (Non-Medical): No  Physical Activity: Sufficiently Active (12/20/2022)   Exercise Vital Sign    Days of Exercise per Week: 7 days    Minutes of Exercise per Session: 60 min  Stress: Stress Concern Present (12/20/2022)   [employer] of Occupational Health - Occupational Stress Questionnaire    Feeling of Stress : To some extent  Social Connections: Moderately Integrated (12/20/2022)   Social Connection and Isolation Panel [NHANES]    Frequency of Communication with Friends and Family: More than three times a week    Frequency of Social Gatherings with Friends and Family: Three times a week    Attends Religious Services: More than 4 times per year    Active Member of Clubs or Organizations: Yes    Attends Banker Meetings: More than 4 times per year    Marital Status: Never married  Intimate Partner Violence: Not At Risk (06/20/2022)   Humiliation, Afraid, Rape, and Kick questionnaire    Fear of Current or Ex-Partner: No    Emotionally Abused: No    Physically Abused: No    Sexually Abused: No    Physical Exam: Vital signs in last 24 hours: @There  were no vitals taken for this visit. GEN: NAD EYE: Sclerae anicteric ENT: MMM CV: Non-tachycardic Pulm:  CTA b/l GI: Soft, NT/ND NEURO:  Alert & Oriented x 3   Doristine Locks, DO [facility]   04/18/2023 8:40 AM

## 2023-04-18 NOTE — Op Note (Signed)
[facility] Patient Name: Lisa Spence Procedure Date: 04/18/2023 9:03 AM MRN: [id number] Endoscopist: Doristine Locks , MD, [id number] Age: 72 Referring MD:  Date of Birth: 11/07/1950 Gender: Female Account #: [id number] Procedure:                Upper GI endoscopy Indications:              Follow-up of reflux esophagitis, Preoperative                            assessment                           72 y.o. female who presents for EGD to evaluate for                            improvement of known LA Grade C erosive esophagitis                            seen on EGD in 11/2022 along with pre-operative                            assessment for possible antireflux surgery.                            Currently treated with Protonix 40 mg BID and                            Pepcid 20 mg at bedtime with good clinical response. Medicines:                Monitored Anesthesia Care Procedure:                Pre-Anesthesia Assessment:                           - Prior to the procedure, a History and Physical                            was performed, and patient medications and                            allergies were reviewed. The patient's tolerance of                            previous anesthesia was also reviewed. The risks                            and benefits of the procedure and the sedation                            options and risks were discussed with the patient.                            All questions were answered, and informed consent  was obtained. Prior Anticoagulants: The patient has                            taken no anticoagulant or antiplatelet agents. ASA                            Grade Assessment: II - A patient with mild systemic                            disease. After reviewing the risks and benefits,                            the patient was deemed in satisfactory condition to                            undergo the  procedure.                           After obtaining informed consent, the endoscope was                            passed under direct vision. Throughout the                            procedure, the patient's blood pressure, pulse, and                            oxygen saturations were monitored continuously. The                            Olympus scope [id number] was introduced through the                            mouth, and advanced to the area of papilla. The                            upper GI endoscopy was accomplished without                            difficulty. The patient tolerated the procedure                            well. Scope In: Scope Out: Findings:                 The examined esophagus was normal.                           The Z-line was regular and was found 39 cm from the                            incisors. The previously noted erosive esophagitis                            has since healed  completely.                           The gastroesophageal flap valve was visualized                            endoscopically and classified as Hill Grade II                            (fold present, opens with respiration).                           Multiple small sessile polyps with no bleeding were                            found in the gastric fundus and in the gastric                            body. These were previously biopsied and consistent                            with benign fundic gland polyps.                           The incisura, gastric antrum and pylorus were                            normal.                           The examined duodenum was normal. Complications:            No immediate complications. Estimated Blood Loss:     Estimated blood loss: none. Impression:               - Normal esophagus.                           - Z-line regular, 39 cm from the incisors.                           - Gastroesophageal flap valve classified as Hill                             Grade II (fold present, opens with respiration).                           - Multiple benign fundic gland polyps.                           - Normal incisura, antrum and pylorus.                           - Normal examined duodenum.                           - No specimens collected. Recommendation:           -  Patient has a contact number available for                            emergencies. The signs and symptoms of potential                            delayed complications were discussed with the                            patient. Return to normal activities tomorrow.                            Written discharge instructions were provided to the                            patient.                           - Resume previous diet.                           - Continue present medications.                           - Transoral Incisionless Fundoplication would be a                            very reasonable option for reflux management and                            the possibility to eliminate or reduce the need for                            ongoing acid suppression therapy.                           - If considering Transoral Incisionless                            Fundoplication (TIF), can follow-up with me in the                            GI clinic. Otherwise, can continue routine GI care                            with Dr. Lavon Paganini. Doristine Locks, MD 04/18/2023 9:21:44 AM

## 2023-04-18 NOTE — Progress Notes (Signed)
Vss nad trans to pacu 

## 2023-04-19 ENCOUNTER — Telehealth: Payer: Self-pay

## 2023-04-19 NOTE — Telephone Encounter (Signed)
  Follow up Call-     04/18/2023    8:44 AM 11/28/2022    9:38 AM  Call back number  Post procedure Call Back phone  # [phone]  Permission to leave phone message Yes Yes     Patient questions:  Do you have a fever, pain , or abdominal swelling? No. Pain Score  0 *  Have you tolerated food without any problems? Yes.    Have you been able to return to your normal activities? Yes.    Do you have any questions about your discharge instructions: Diet   No. Medications  No. Follow up visit  No.  Do you have questions or concerns about your Care? No.  Actions: * If pain score is 4 or above: No action needed, pain <4.

## 2023-04-23 ENCOUNTER — Telehealth: Payer: Self-pay | Admitting: Nurse Practitioner

## 2023-04-23 NOTE — Telephone Encounter (Signed)
error 

## 2023-04-24 ENCOUNTER — Encounter: Payer: Self-pay | Admitting: Family Medicine

## 2023-04-24 ENCOUNTER — Telehealth: Payer: Self-pay | Admitting: Nurse Practitioner

## 2023-04-24 ENCOUNTER — Ambulatory Visit (INDEPENDENT_AMBULATORY_CARE_PROVIDER_SITE_OTHER): Payer: Medicare HMO | Admitting: Family Medicine

## 2023-04-24 VITALS — BP 118/70 | HR 82 | Temp 97.6°F | Ht 66.0 in | Wt 174.6 lb

## 2023-04-24 DIAGNOSIS — U071 COVID-19: Secondary | ICD-10-CM

## 2023-04-24 LAB — POC COVID19 BINAXNOW: SARS Coronavirus 2 Ag: POSITIVE — AB

## 2023-04-24 MED ORDER — HYDROCODONE BIT-HOMATROP MBR 5-1.5 MG/5ML PO SOLN
5.0000 mL | Freq: Three times a day (TID) | ORAL | 0 refills | Status: DC | PRN
Start: 2023-04-24 — End: 2023-08-17

## 2023-04-24 MED ORDER — BENZONATATE 100 MG PO CAPS
100.0000 mg | ORAL_CAPSULE | Freq: Two times a day (BID) | ORAL | 0 refills | Status: DC | PRN
Start: 2023-04-24 — End: 2023-08-17

## 2023-04-24 MED ORDER — NIRMATRELVIR/RITONAVIR (PAXLOVID)TABLET: 3.0000 | ORAL_TABLET | Freq: Two times a day (BID) | ORAL | 0 refills | Status: AC

## 2023-04-24 NOTE — Telephone Encounter (Signed)
Pts pharmacy called and told her they won't have the Paxlovid until tomorrow is it ok for her to wait and start it then?

## 2023-04-24 NOTE — Assessment & Plan Note (Signed)
Reviewed home care instructions for COVID, including rest, pushing fluids, and OTC medications as needed for symptom relief. Recommend hot tea with honey for sore throat symptoms. I will prescribe Paxlovid. I will also provide some cough medicine. Advised self-isolation at home until 24 hours after fever resolved and symptoms improve. Continue to wear a mask around others for an additional 5 days. If symptoms, esp, dyspnea develops/worsens, recommend in-person evaluation at either an urgent care or the emergency room.

## 2023-04-24 NOTE — Progress Notes (Signed)
[facility] [address] [place] [address] Dept: [phone] Dept Fax: [phone]  Office Visit  Subjective:    Patient ID: Lisa Spence, female    DOB: 06/24/1951, 72 y.o..   MRN: [id number]  Chief Complaint  Patient presents with   Cough    C/o having low grade fever, cough, chills, watery eyes, ear pain x 4 days.  Has taken Robintusion and Tylenol.  Negative home Covid test.   History of Present Illness:  Patient is in today complaining of a 4-day history of cough, chills, low-grade fever (100.4 F), nasal congestion, fatigue and body aches. She had performed a home COVID test twice. The second test, was partially positive, so she was unsure what to do. She has been using Robitussin and Tylenol for her symptoms.  Past Medical History: Patient Active Problem List   Diagnosis Date Noted   COVID-19 04/24/2023   Localized swelling of right lower leg 02/20/2023   Routine general medical examination at a health care facility 02/13/2023   Heartburn 12/21/2022   Primary osteoarthritis of both hands 12/20/2022   Weak urinary stream 10/24/2022   Elevated blood pressure reading 05/02/2022   Belching 01/04/2022   Hyperlipidemia 12/06/2021   Elevated LFTs 11/08/2021   Bursitis of right hip 05/27/2021   Osteoporosis 03/10/2020   Hot flashes Spence to menopause 05/02/2019   Anxiety 04/25/2019   GERD (gastroesophageal reflux disease) 04/25/2019   Constipation 04/25/2019   Vaginal dryness, menopausal 04/25/2019   Left trimalleolar fracture, closed, initial encounter 07/09/2017   Past Surgical History:  Procedure Laterality Date   ABDOMINAL HYSTERECTOMY     COLONOSCOPY     ESOPHAGEAL MANOMETRY N/A 12/20/2022   Procedure: ESOPHAGEAL MANOMETRY (EM);  Surgeon: Napoleon Form, MD;  Location: [facility];  Service: Gastroenterology;  Laterality: N/A;   ORIF ANKLE FRACTURE Left 07/09/2017   Procedure: OPEN REDUCTION  INTERNAL FIXATION (ORIF) ANKLE FRACTURE;  Surgeon: Yolonda Kida, MD;  Location: [facility] OR;  Service: Orthopedics;  Laterality: Left;  90 mins   Family History  Problem Relation Age of Onset   Heart disease Mother    Heart disease Father    Heart disease Maternal Grandmother    Heart disease Maternal Grandfather    Heart disease Paternal Grandmother    Heart disease Paternal Grandfather    Colon cancer Neg Hx    Esophageal cancer Neg Hx    Rectal cancer Neg Hx    Stomach cancer Neg Hx    Outpatient Medications Prior to Visit  Medication Sig Dispense Refill   calcium carbonate (OS-CAL) 1250 (500 Ca) MG chewable tablet Chew 1 tablet by mouth daily.     cholecalciferol (VITAMIN D3) 25 MCG (1000 UNIT) tablet Take 1,000 Units by mouth daily.     clonazePAM (KLONOPIN) 0.5 MG tablet Take 1 tablet (0.5 mg total) by mouth 2 (two) times daily as needed for anxiety. 60 tablet 0   Efinaconazole 10 % SOLN Apply 1 application  topically daily. 8 mL 2   famotidine (PEPCID) 20 MG tablet TAKE 1 TABLET(20 MG) BY MOUTH AT BEDTIME 90 tablet 3   hydrocortisone 2.5 % cream Apply topically daily.     ketoconazole (NIZORAL) 2 % cream Apply topically daily.     Misc Natural Products (FOCUSED MIND PO) Take by mouth. Daily vitamins     Multiple Vitamins-Minerals (ONE A DAY WOMEN 50 PLUS PO) Take 1 tablet by mouth daily.     nystatin cream (MYCOSTATIN)  Apply topically 2 (two) times daily.     Omega-3 Fatty Acids (FISH OIL OMEGA-3 PO) Take 1 capsule by mouth daily.     pantoprazole (PROTONIX) 40 MG tablet TAKE 1 TABLET(40 MG) BY MOUTH DAILY AS NEEDED (Patient taking differently: Take 40 mg by mouth 2 (two) times daily.) 90 tablet 0   polyethylene glycol (MIRALAX / GLYCOLAX) 17 g packet Take 17 g by mouth as needed.     PREMARIN vaginal cream INSERT 1 APPLICATORFUL VAGINALLY 2 TIMES A WEEK 30 g 5   venlafaxine XR (EFFEXOR-XR) 150 MG 24 hr capsule Take 1 capsule (150 mg total) by mouth daily with breakfast. 90  capsule 0   No facility-administered medications prior to visit.   No Known Allergies   Objective:   Today's Vitals   04/24/23 0803  BP: 118/70  Pulse: 82  Temp: 97.6 F (36.4 C)  TempSrc: Temporal  SpO2: 97%  Weight: 174 lb 9.6 oz (79.2 kg)  Height: 5\' 6"  (1.676 m)   Body mass index is 28.18 kg/m.   General: Well developed, well nourished. No acute distress. HEENT: Normocephalic, non-traumatic. PERRL, EOMI. Conjunctiva clear. External ears normal. EAC and TMs   normal bilaterally. Nose with mild congestion and rhinorrhea. Mucous membranes moist. Oropharynx clear. Good   dentition. Neck: Supple. No lymphadenopathy. No thyromegaly. Lungs: Clear to auscultation bilaterally. No wheezing, rales or rhonchi. Psych: Alert and oriented. Normal mood and affect.  Health Maintenance Spence  Topic Date Spence   Zoster Vaccines- Shingrix (1 of 2) Never done   Medicare Annual Wellness (AWV)  06/21/2023   Lab Results: POCT Covid: Neg.    Assessment & Plan:   Problem List Items Addressed This Visit       Other   COVID-19 - Primary    Reviewed home care instructions for COVID, including rest, pushing fluids, and OTC medications as needed for symptom relief. Recommend hot tea with honey for sore throat symptoms. I will prescribe Paxlovid. I will also provide some cough medicine. Advised self-isolation at home until 24 hours after fever resolved and symptoms improve. Continue to wear a mask around others for an additional 5 days. If symptoms, esp, dyspnea develops/worsens, recommend in-person evaluation at either an urgent care or the emergency room.       Relevant Medications   nirmatrelvir/ritonavir (PAXLOVID) 20 x 150 MG & 10 x 100MG  TABS   benzonatate (TESSALON) 100 MG capsule   HYDROcodone bit-homatropine (HYCODAN) 5-1.5 MG/5ML syrup   Other Relevant Orders   POC COVID-19 (Completed)    Return if symptoms worsen or fail to improve.   Loyola Mast, MD

## 2023-04-24 NOTE — Telephone Encounter (Signed)
Left detailed VM that I had spoke to pharmacy and they do have her medication ready for pick up. Dm/cma

## 2023-04-25 ENCOUNTER — Encounter (INDEPENDENT_AMBULATORY_CARE_PROVIDER_SITE_OTHER): Payer: Self-pay

## 2023-05-08 ENCOUNTER — Ambulatory Visit: Payer: Medicare HMO

## 2023-05-09 ENCOUNTER — Ambulatory Visit: Payer: Medicare HMO

## 2023-05-09 VITALS — BP 138/78

## 2023-05-09 DIAGNOSIS — M810 Age-related osteoporosis without current pathological fracture: Secondary | ICD-10-CM | POA: Diagnosis not present

## 2023-05-09 MED ORDER — DENOSUMAB 60 MG/ML ~~LOC~~ SOSY
60.0000 mg | PREFILLED_SYRINGE | Freq: Once | SUBCUTANEOUS | Status: AC
Start: 2023-05-09 — End: 2023-05-09
  Administered 2023-05-09: 60 mg via SUBCUTANEOUS

## 2023-05-09 NOTE — Progress Notes (Signed)
After obtaining consent, and per orders of Dr. Rodman Pickle, injection of Prolia 60mg  given SubQ right arm by Pamala Hurry [place]. Patient instructed to remain in clinic for 20 minutes afterwards, and to report any adverse reaction to me immediately. Pt tolerated well.   Medication patient-supplied. NDC 43329-518-84 [id number] EXP 03/24/2025

## 2023-05-10 ENCOUNTER — Encounter (INDEPENDENT_AMBULATORY_CARE_PROVIDER_SITE_OTHER): Payer: Self-pay

## 2023-05-16 ENCOUNTER — Ambulatory Visit: Payer: Medicare HMO | Admitting: Nurse Practitioner

## 2023-05-18 ENCOUNTER — Ambulatory Visit: Payer: Medicare HMO | Admitting: Podiatry

## 2023-05-18 ENCOUNTER — Ambulatory Visit: Payer: Medicare HMO

## 2023-05-18 DIAGNOSIS — L84 Corns and callosities: Secondary | ICD-10-CM

## 2023-05-18 DIAGNOSIS — M79675 Pain in left toe(s): Secondary | ICD-10-CM

## 2023-05-18 DIAGNOSIS — M79674 Pain in right toe(s): Secondary | ICD-10-CM

## 2023-05-18 DIAGNOSIS — B351 Tinea unguium: Secondary | ICD-10-CM

## 2023-05-18 DIAGNOSIS — M7751 Other enthesopathy of right foot: Secondary | ICD-10-CM

## 2023-05-18 NOTE — Progress Notes (Signed)
Subjective: Chief Complaint  Patient presents with   Nail Problem    Routine Foot Care-nail trim      72 year old female presents the office today for concerns of thick, elongated toenails that she can has difficulty trimming herself for nail fungus.  She thinks that she has a new spot of the callus on the bottom of her left foot.  She does not need to get an ankle x-ray today as her ankle is doing well.  Objective: AAO x3, NAD DP/PT pulses palpable bilaterally, CRT less than 3 seconds Nails are hypertrophic, dystrophic, brittle, discolored, elongated 10. No surrounding redness or drainage. Tenderness nails 1-5 bilaterally.  There is minimal callus formation on the medial first MPJ bilaterally without any underlying ulceration drainage or signs of infection.  Minimal hyperkeratotic lesion submetatarsal 4 left foot without any underlying ulceration drainage or any signs of infection. There is no pain identified to the left ankle. No edema.  No pain with calf compression, swelling, warmth, erythema  Assessment: Symptomatic onychomycosis, hyperkeratotic lesions, Achilles tendonitis  Plan: -All treatment options discussed with the patient including all alternatives, risks, complications.  -Sharply to the nails x10 without any complications or bleeding.  Discussed topical medication.  Continue Jublia -Sharply debrided the mild hyperkeratotic lesion x3 without any complications or bleeding as a courtesy as they are minimal.  Discussed moisturizer.  She can use a pumice stone as well to help. -Patient encouraged to call the office with any questions, concerns, change in symptoms.   Return in about 3 months (around 08/18/2023) for nail/callus trim.  Vivi Barrack DPM

## 2023-05-31 DIAGNOSIS — L659 Nonscarring hair loss, unspecified: Secondary | ICD-10-CM | POA: Diagnosis not present

## 2023-05-31 DIAGNOSIS — L304 Erythema intertrigo: Secondary | ICD-10-CM | POA: Diagnosis not present

## 2023-06-12 ENCOUNTER — Telehealth: Payer: Self-pay | Admitting: Gastroenterology

## 2023-06-12 DIAGNOSIS — K219 Gastro-esophageal reflux disease without esophagitis: Secondary | ICD-10-CM

## 2023-06-12 MED ORDER — FAMOTIDINE 20 MG PO TABS: 20.0000 mg | ORAL_TABLET | Freq: Every day | ORAL | 1 refills | Status: DC

## 2023-06-12 MED ORDER — PANTOPRAZOLE SODIUM 40 MG PO TBEC: 40.0000 mg | DELAYED_RELEASE_TABLET | Freq: Two times a day (BID) | ORAL | 1 refills | Status: DC

## 2023-06-12 NOTE — Telephone Encounter (Signed)
Lisa Spence patient. Saw Dr Barron Alvine most recently for Endo and consideration of TIF. Patient says she was advised to take pantoprazole 40 mg twice daily, famotidine 20 mg every night (as seen in Dr Frankey Shown last office note 01/31/23) but was never given an update prescription (had some medication still on hand at home). At endo, Dr Barron Alvine noted that patient would be a good candidate for TIF if she wishes and she would follow all her other GI concerns with Dr Lavon Paganini. Patient is not interested in TIF at this time as she is doing well on current medications. Will refill medication under Dr Lavon Paganini, her primary GI dr.

## 2023-06-12 NOTE — Telephone Encounter (Signed)
Left message for patient to call back  

## 2023-06-12 NOTE — Telephone Encounter (Signed)
Inbound call from patient stating that Dr. Barron Alvine needs to update some new  prescriptions for her. Requesting a call to discuss. Please advise.

## 2023-06-18 ENCOUNTER — Ambulatory Visit (INDEPENDENT_AMBULATORY_CARE_PROVIDER_SITE_OTHER): Payer: Medicare HMO | Admitting: Nurse Practitioner

## 2023-06-18 ENCOUNTER — Other Ambulatory Visit: Payer: Self-pay | Admitting: Nurse Practitioner

## 2023-06-18 ENCOUNTER — Encounter: Payer: Self-pay | Admitting: Nurse Practitioner

## 2023-06-18 VITALS — BP 132/78 | HR 90 | Temp 97.1°F | Wt 175.0 lb

## 2023-06-18 DIAGNOSIS — K2100 Gastro-esophageal reflux disease with esophagitis, without bleeding: Secondary | ICD-10-CM | POA: Diagnosis not present

## 2023-06-18 DIAGNOSIS — R0602 Shortness of breath: Secondary | ICD-10-CM

## 2023-06-18 DIAGNOSIS — N951 Menopausal and female climacteric states: Secondary | ICD-10-CM

## 2023-06-18 DIAGNOSIS — Z23 Encounter for immunization: Secondary | ICD-10-CM

## 2023-06-18 DIAGNOSIS — F419 Anxiety disorder, unspecified: Secondary | ICD-10-CM

## 2023-06-18 MED ORDER — ALBUTEROL SULFATE HFA 108 (90 BASE) MCG/ACT IN AERS: 2.0000 | INHALATION_SPRAY | Freq: Four times a day (QID) | RESPIRATORY_TRACT | 1 refills | Status: DC | PRN

## 2023-06-18 MED ORDER — CLONIDINE HCL 0.1 MG PO TABS: 0.1000 mg | ORAL_TABLET | Freq: Every day | ORAL | 1 refills | Status: DC

## 2023-06-18 NOTE — Assessment & Plan Note (Signed)
Chronic, stable. She is following with GI who has her on protonix 40mg  BID and pepcid 20mg  daily. Continue collaboration and recommendations from GI.

## 2023-06-18 NOTE — Assessment & Plan Note (Signed)
She has been experiencing episodes where it is hard to take a deep breath over the past few years.  She denies wheezing, allergies.  Will have her start an albuterol inhaler every 4-6 hours as needed.  This may be related to anxiety as well, she can try to take a Klonopin and see if this helps as well.

## 2023-06-18 NOTE — Progress Notes (Signed)
Established Patient Office Visit  Subjective   Patient ID: Lisa Spence, female    DOB: 12/01/1950  Age: 72 y.o. MRN: [id number]  Chief Complaint  Patient presents with   Anxiety and Night Sweats    Follow up, no concerns    HPI  Lisa Spence is here to follow-up on GERD, hot flashes, and anxiety.   She is still following with GI. She is currently taking pepcid 20mg  daily and pantoprazole 40mg  BID. She states that her reflux is doing well. She denies any symptoms.   She is taking venlafaxine 150mg  daily for both anxiety and menopausal hot flashes. She states that her anxiety is doing well, however she is still having frequent hot flashes and night sweats are still occurring both day and night. She states that she has been having issues with intertrigo. She is following with dermatology and is taking diflucan currently.   She has been experiencing episodes of unable to feel like she can take a deep breath. She will take a few shorter breaths and then she will be able to take a deep breath. She states that this has been going on for years. She denies chest pain. She states that it did not start with any illness. She is wondering if it may be related to her anxiety.      06/18/2023    8:51 AM 02/13/2023    8:33 AM 06/20/2022    9:11 AM 06/20/2022    9:10 AM 12/06/2021    9:22 AM  Depression screen PHQ 2/9  Decreased Interest 0 0 1 0 0  Down, Depressed, Hopeless 0 0 1 0 0  PHQ - 2 Score 0 0 2 0 0  Altered sleeping 1 1   0  Tired, decreased energy 0 0   0  Change in appetite 0 0   0  Feeling bad or failure about yourself  0 0   0  Trouble concentrating 0 0   0  Moving slowly or fidgety/restless 0 0   0  Suicidal thoughts 0 0   0  PHQ-9 Score 1 1   0  Difficult doing work/chores Not difficult at all Not difficult at all   Not difficult at all      06/18/2023    8:51 AM 02/13/2023    8:34 AM 12/06/2021    9:22 AM 11/08/2021    9:58 AM  GAD 7 : Generalized Anxiety Score   Nervous, Anxious, on Edge 1 1 2 2   Control/stop worrying 0 0 1 1  Worry too much - different things 0 0 0 0  Trouble relaxing 0 0 1 2  Restless 0 0 1 0  Easily annoyed or irritable 1 1 1 1   Afraid - awful might happen 0 0 0 0  Total GAD 7 Score 2 2 6 6   Anxiety Difficulty Not difficult at all Somewhat difficult  Somewhat difficult      ROS See pertinent positives and negatives per HPI.    Objective:     BP 132/78 (BP Location: Left Arm, Cuff Size: Large)   Pulse 90   Temp (!) 97.1 F (36.2 C)   Wt 175 lb (79.4 kg)   SpO2 98%   BMI 28.25 kg/m  BP Readings from Last 3 Encounters:  06/18/23 132/78  05/09/23 138/78  04/24/23 118/70   Wt Readings from Last 3 Encounters:  06/18/23 175 lb (79.4 kg)  04/24/23 174 lb 9.6 oz (79.2 kg)  04/18/23  180 lb (81.6 kg)      Physical Exam Vitals and nursing note reviewed.  Constitutional:      General: She is not in acute distress.    Appearance: Normal appearance.  HENT:     Head: Normocephalic.     Right Ear: Tympanic membrane, ear canal and external ear normal.     Left Ear: Tympanic membrane, ear canal and external ear normal.  Eyes:     Conjunctiva/sclera: Conjunctivae normal.  Cardiovascular:     Rate and Rhythm: Normal rate and regular rhythm.     Pulses: Normal pulses.     Heart sounds: Normal heart sounds.  Pulmonary:     Effort: Pulmonary effort is normal.     Breath sounds: Normal breath sounds.  Musculoskeletal:     Cervical back: Normal range of motion.  Skin:    General: Skin is warm.  Neurological:     General: No focal deficit present.     Mental Status: She is alert and oriented to person, place, and time.  Psychiatric:        Mood and Affect: Mood normal.        Behavior: Behavior normal.        Thought Content: Thought content normal.        Judgment: Judgment normal.    The 10-year ASCVD risk score (Arnett DK, et al., 2019) is: 10%    Assessment & Plan:   Problem List Items Addressed  This Visit       Cardiovascular and Mediastinum   Hot flashes due to menopause - Primary    Chronic, not controlled.  She is still experiencing frequent hot flashes and sweating.  Will have her start clonidine 0.1 mg daily.  Continue wearing layered clothing and using fans as needed.  Follow-up in 2 to 3 months.      Relevant Medications   cloNIDine (CATAPRES) 0.1 MG tablet     Digestive   GERD (gastroesophageal reflux disease)    Chronic, stable. She is following with GI who has her on protonix 40mg  BID and pepcid 20mg  daily. Continue collaboration and recommendations from GI.         Other   Anxiety    Chronic, stable.  Continue venlafaxine 150 mg daily.  Follow-up with any concerns.      Shortness of breath    She has been experiencing episodes where it is hard to take a deep breath over the past few years.  She denies wheezing, allergies.  Will have her start an albuterol inhaler every 4-6 hours as needed.  This may be related to anxiety as well, she can try to take a Klonopin and see if this helps as well.      Other Visit Diagnoses     Immunization due       Flu vaccine given today   Relevant Orders   Flu Vaccine Trivalent High Dose (Fluad) (Completed)       Return in about 2 months (around 08/18/2023) for 2-3 months hot flashes.    Lisa Scull, NP

## 2023-06-18 NOTE — Patient Instructions (Signed)
It was great to see you!  Start clonidine 1 tablet daily to help with hot flashes.   Start albuterol inhaler every 4-6 hours as needed for shortness of breath.   Let's follow-up in 2-3 months, sooner if you have concerns.  If a referral was placed today, you will be contacted for an appointment. Please note that routine referrals can sometimes take up to 3-4 weeks to process. Please call our office if you haven't heard anything after this time frame.  Take care,  Rodman Pickle, NP

## 2023-06-18 NOTE — Assessment & Plan Note (Signed)
Chronic, not controlled.  She is still experiencing frequent hot flashes and sweating.  Will have her start clonidine 0.1 mg daily.  Continue wearing layered clothing and using fans as needed.  Follow-up in 2 to 3 months.

## 2023-06-18 NOTE — Assessment & Plan Note (Signed)
Chronic, stable.  Continue venlafaxine 150 mg daily.  Follow-up with any concerns.

## 2023-06-19 DIAGNOSIS — H5213 Myopia, bilateral: Secondary | ICD-10-CM | POA: Diagnosis not present

## 2023-06-19 DIAGNOSIS — Z0100 Encounter for examination of eyes and vision without abnormal findings: Secondary | ICD-10-CM | POA: Diagnosis not present

## 2023-06-25 ENCOUNTER — Ambulatory Visit (INDEPENDENT_AMBULATORY_CARE_PROVIDER_SITE_OTHER): Payer: Medicare HMO

## 2023-06-25 DIAGNOSIS — Z0000 Encounter for general adult medical examination without abnormal findings: Secondary | ICD-10-CM

## 2023-06-25 NOTE — Patient Instructions (Signed)
Ms. Mikulski , Thank you for taking time to come for your Medicare Wellness Visit. I appreciate your ongoing commitment to your health goals. Please review the following plan we discussed and let me know if I can assist you in the future.   Referrals/Orders/Follow-Ups/Clinician Recommendations: none  This is a list of the screening recommended for you and due dates:  Health Maintenance  Topic Date Due   Zoster (Shingles) Vaccine (1 of 2) Never done   Mammogram  09/14/2023   Medicare Annual Wellness Visit  06/24/2024   Colon Cancer Screening  11/28/2027   DTaP/Tdap/Td vaccine (2 - Td or Tdap) 11/09/2031   Pneumonia Vaccine  Completed   Flu Shot  Completed   DEXA scan (bone density measurement)  Completed   Hepatitis C Screening  Completed   HPV Vaccine  Aged Out   COVID-19 Vaccine  Discontinued    Advanced directives: (ACP Link)Information on Advanced Care Planning can be found at [facility] Advance Health Care Directives Advance Health Care Directives ([email or web address]   Next Medicare Annual Wellness Visit scheduled for next year: Yes  Insert Preventive Care attachment Insert FALL PREVENTION attachment if needed

## 2023-06-25 NOTE — Progress Notes (Signed)
Subjective:   Lisa Spence is a 72 y.o. female who presents for Medicare Annual (Subsequent) preventive examination.  Visit Complete: Virtual  I connected with  Laqueta Due on 06/25/23 by a audio enabled telemedicine application and verified that I am speaking with the correct person using two identifiers.  Patient Location: Home  Provider Location: Office/Clinic  I discussed the limitations of evaluation and management by telemedicine. The patient expressed understanding and agreed to proceed.  Patient Medicare AWV questionnaire was completed by the patient on 06/24/2023; I have confirmed that all information answered by patient is correct and no changes since this date.  Because this visit was a virtual/telehealth visit, some criteria may be missing or patient reported. Any vitals not documented were not able to be obtained and vitals that have been documented are patient reported.   Cardiac Risk Factors include: dyslipidemia     Objective:    Today's Vitals   There is no height or weight on file to calculate BMI.     06/25/2023    8:49 AM 06/20/2022    9:14 AM 05/17/2021   12:18 PM 07/11/2017    8:00 AM 07/05/2017   10:09 AM 06/23/2017   10:44 PM  Advanced Directives  Does Patient Have a Medical Advance Directive? No Yes No No No No  Type of Special educational needs teacher of [place];Living will      Copy of Healthcare Power of Attorney in Chart?  No - copy requested      Would patient like information on creating a medical advance directive?   No - Patient declined No - Patient declined No - Patient declined     Current Medications (verified) Outpatient Encounter Medications as of 06/25/2023  Medication Sig   albuterol (VENTOLIN HFA) 108 (90 Base) MCG/ACT inhaler Inhale 2 puffs into the lungs every 6 (six) hours as needed for wheezing or shortness of breath.   bimatoprost (LATISSE) 0.03 % ophthalmic solution 1 Application daily.   calcium carbonate (OS-CAL)  1250 (500 Ca) MG chewable tablet Chew 1 tablet by mouth daily.   cholecalciferol (VITAMIN D3) 25 MCG (1000 UNIT) tablet Take 1,000 Units by mouth daily.   clonazePAM (KLONOPIN) 0.5 MG tablet Take 1 tablet (0.5 mg total) by mouth 2 (two) times daily as needed for anxiety.   cloNIDine (CATAPRES) 0.1 MG tablet Take 1 tablet (0.1 mg total) by mouth daily.   Efinaconazole 10 % SOLN Apply 1 application  topically daily.   famotidine (PEPCID) 20 MG tablet Take 1 tablet (20 mg total) by mouth at bedtime.   hydrocortisone 2.5 % cream Apply topically daily.   ketoconazole (NIZORAL) 2 % cream Apply topically daily.   Misc Natural Products (FOCUSED MIND PO) Take by mouth. Daily vitamins   Multiple Vitamins-Minerals (ONE A DAY WOMEN 50 PLUS PO) Take 1 tablet by mouth daily.   nystatin cream (MYCOSTATIN) Apply topically 2 (two) times daily.   pantoprazole (PROTONIX) 40 MG tablet Take 1 tablet (40 mg total) by mouth 2 (two) times daily.   polyethylene glycol (MIRALAX / GLYCOLAX) 17 g packet Take 17 g by mouth as needed.   PREMARIN vaginal cream INSERT 1 APPLICATORFUL VAGINALLY 2 TIMES A WEEK   venlafaxine XR (EFFEXOR-XR) 150 MG 24 hr capsule Take 1 capsule (150 mg total) by mouth daily with breakfast.   benzonatate (TESSALON) 100 MG capsule Take 1 capsule (100 mg total) by mouth 2 (two) times daily as needed for cough. (Patient not taking: Reported  on 06/18/2023)   HYDROcodone bit-homatropine (HYCODAN) 5-1.5 MG/5ML syrup Take 5 mLs by mouth every 8 (eight) hours as needed for cough. (Patient not taking: Reported on 06/18/2023)   Omega-3 Fatty Acids (FISH OIL OMEGA-3 PO) Take 1 capsule by mouth daily. (Patient not taking: Reported on 06/25/2023)   No facility-administered encounter medications on file as of 06/25/2023.    Allergies (verified) Patient has no known allergies.   History: Past Medical History:  Diagnosis Date   Allergy    Anxiety    Fatty liver    GERD (gastroesophageal reflux disease)     Internal hemorrhoids    Osteoporosis    Past Surgical History:  Procedure Laterality Date   ABDOMINAL HYSTERECTOMY     COLONOSCOPY     ESOPHAGEAL MANOMETRY N/A 12/20/2022   Procedure: ESOPHAGEAL MANOMETRY (EM);  Surgeon: Napoleon Form, MD;  Location: [facility];  Service: Gastroenterology;  Laterality: N/A;   ORIF ANKLE FRACTURE Left 07/09/2017   Procedure: OPEN REDUCTION INTERNAL FIXATION (ORIF) ANKLE FRACTURE;  Surgeon: Yolonda Kida, MD;  Location: [facility];  Service: Orthopedics;  Laterality: Left;  90 mins   Family History  Problem Relation Age of Onset   Heart disease Mother    Heart disease Father    Heart disease Maternal Grandmother    Heart disease Maternal Grandfather    Heart disease Paternal Grandmother    Heart disease Paternal Grandfather    Colon cancer Neg Hx    Esophageal cancer Neg Hx    Rectal cancer Neg Hx    Stomach cancer Neg Hx    Social History   Socioeconomic History   Marital status: Single    Spouse name: Not on file   Number of children: Not on file   Years of education: Not on file   Highest education level: Bachelor's degree (e.g., BA, AB, BS)  Occupational History   Not on file  Tobacco Use   Smoking status: Never   Smokeless tobacco: Never  Vaping Use   Vaping status: Never Used  Substance and Sexual Activity   Alcohol use: No    Comment: no longer drinks, drank 6 glasses wine/week stopped in 2007   Drug use: No   Sexual activity: Not Currently  Other Topics Concern   Not on file  Social History Narrative   Not on file   Social Determinants of Health   Financial Resource Strain: Low Risk  (06/24/2023)   Overall Financial Resource Strain (CARDIA)    Difficulty of Paying Living Expenses: Not hard at all  Food Insecurity: No Food Insecurity (06/24/2023)   Hunger Vital Sign    Worried About Running Out of Food in the Last Year: Never true    Ran Out of Food in the Last Year: Never true  Transportation Needs: No  Transportation Needs (06/24/2023)   PRAPARE - Administrator, Civil Service (Medical): No    Lack of Transportation (Non-Medical): No  Physical Activity: Sufficiently Active (06/24/2023)   Exercise Vital Sign    Days of Exercise per Week: 7 days    Minutes of Exercise per Session: 40 min  Stress: Stress Concern Present (06/24/2023)   [employer] of Occupational Health - Occupational Stress Questionnaire    Feeling of Stress : To some extent  Social Connections: Moderately Integrated (06/24/2023)   Social Connection and Isolation Panel [NHANES]    Frequency of Communication with Friends and Family: More than three times a week    Frequency of Social  Gatherings with Friends and Family: More than three times a week    Attends Religious Services: More than 4 times per year    Active Member of Clubs or Organizations: Yes    Attends Engineer, structural: More than 4 times per year    Marital Status: Never married    Tobacco Counseling Counseling given: Not Answered   Clinical Intake:  Pre-visit preparation completed: Yes  Pain : No/denies pain     Nutritional Risks: None Diabetes: No  How often do you need to have someone help you when you read instructions, pamphlets, or other written materials from your doctor or pharmacy?: 1 - Never  Interpreter Needed?: No  Information entered by :: NAllen LPN   Activities of Daily Living    06/24/2023    1:44 PM  In your present state of health, do you have any difficulty performing the following activities:  Hearing? 0  Vision? 0  Difficulty concentrating or making decisions? 0  Walking or climbing stairs? 0  Dressing or bathing? 0  Doing errands, shopping? 0  Preparing Food and eating ? N  Using the Toilet? N  In the past six months, have you accidently leaked urine? N  Do you have problems with loss of bowel control? N  Managing your Medications? N  Managing your Finances? N  Housekeeping or managing  your Housekeeping? N    Patient Care Team: Gerre Scull, NP as PCP - General (Internal Medicine)  Indicate any recent Medical Services you may have received from other than [facility] providers in the past year (date may be approximate).     Assessment:   This is a routine wellness examination for [place].  Hearing/Vision screen Hearing Screening - Comments:: Denies hearing issues Vision Screening - Comments:: Regular eye exams, [facility]   Goals Addressed             This Visit's Progress    Patient Stated       06/25/2023, wants overall wellness       Depression Screen    06/25/2023    8:50 AM 06/18/2023    8:51 AM 02/13/2023    8:33 AM 06/20/2022    9:11 AM 06/20/2022    9:10 AM 12/06/2021    9:22 AM 11/08/2021    9:57 AM  PHQ 2/9 Scores  PHQ - 2 Score 0 0 0 2 0 0 0  PHQ- 9 Score  1 1   0 5    Fall Risk    06/24/2023    1:44 PM 06/18/2023    8:07 AM 02/13/2023    8:28 AM 12/20/2022    1:40 PM 10/24/2022   10:30 AM  Fall Risk   Falls in the past year? 0 0 0 0 0  Number falls in past yr: 0 0 0 0   Injury with Fall? 0 0 0 0   Risk for fall due to : Medication side effect No Fall Risks No Fall Risks No Fall Risks No Fall Risks  Follow up Falls prevention discussed;Falls evaluation completed Falls evaluation completed Falls evaluation completed Falls evaluation completed Falls evaluation completed    MEDICARE RISK AT HOME: Medicare Risk at Home Any stairs in or around the home?: Yes If so, are there any without handrails?: No Home free of loose throw rugs in walkways, pet beds, electrical cords, etc?: Yes Adequate lighting in your home to reduce risk of falls?: Yes Life alert?: No Use of a cane, walker or  w/c?: No Grab bars in the bathroom?: No Shower chair or bench in shower?: No Elevated toilet seat or a handicapped toilet?: No  TIMED UP AND GO:  Was the test performed?  No    Cognitive Function:        06/25/2023    8:50 AM 06/20/2022    9:16 AM  05/17/2021   11:03 AM  6CIT Screen  What Year? 0 points 0 points 0 points  What month? 0 points 0 points 0 points  What time? 0 points 0 points 0 points  Count back from 20 0 points 0 points 0 points  Months in reverse 0 points 0 points 0 points  Repeat phrase 0 points 0 points 0 points  Total Score 0 points 0 points 0 points    Immunizations Immunization History  Administered Date(s) Administered   Fluad Quad(high Dose 65+) 07/23/2019, 08/11/2020, 09/01/2021, 07/19/2022   Fluad Trivalent(High Dose 65+) 06/18/2023   PFIZER Comirnaty(Gray Top)Covid-19 Tri-Sucrose Vaccine 12/25/2019, 01/23/2020, 10/12/2020   Pneumococcal Conjugate-13 07/23/2019   Pneumococcal Polysaccharide-23 08/11/2020   Tdap 11/08/2021    TDAP status: Up to date  Flu Vaccine status: Up to date  Pneumococcal vaccine status: Up to date  Covid-19 vaccine status: Information provided on how to obtain vaccines.   Qualifies for Shingles Vaccine? Yes   Zostavax completed No   Shingrix Completed?: No.    Education has been provided regarding the importance of this vaccine. Patient has been advised to call insurance company to determine out of pocket expense if they have not yet received this vaccine. Advised may also receive vaccine at local pharmacy or Health Dept. Verbalized acceptance and understanding.  Screening Tests Health Maintenance  Topic Date Due   Zoster Vaccines- Shingrix (1 of 2) Never done   MAMMOGRAM  09/14/2023   Medicare Annual Wellness (AWV)  06/24/2024   Colonoscopy  11/28/2027   DTaP/Tdap/Td (2 - Td or Tdap) 11/09/2031   Pneumonia Vaccine 67+ Years old  Completed   INFLUENZA VACCINE  Completed   DEXA SCAN  Completed   Hepatitis C Screening  Completed   HPV VACCINES  Aged Out   COVID-19 Vaccine  Discontinued    Health Maintenance  Health Maintenance Due  Topic Date Due   Zoster Vaccines- Shingrix (1 of 2) Never done    Colorectal cancer screening: Type of screening: Colonoscopy.  Completed 11/28/2022. Repeat every 5 years  Mammogram status: Completed 09/13/2022. Repeat every year  Bone Density status: Completed 10/19/2022.   Lung Cancer Screening: (Low Dose CT Chest recommended if Age 84-80 years, 20 pack-year currently smoking OR have quit w/in 15years.) does not qualify.   Lung Cancer Screening Referral: no  Additional Screening:  Hepatitis C Screening: does qualify; Completed 04/25/2019  Vision Screening: Recommended annual ophthalmology exams for early detection of glaucoma and other disorders of the eye. Is the patient up to date with their annual eye exam?  Yes  Who is the provider or what is the name of the office in which the patient attends annual eye exams? [facility] If pt is not established with a provider, would they like to be referred to a provider to establish care? No .   Dental Screening: Recommended annual dental exams for proper oral hygiene  Diabetic Foot Exam: n/a  Community Resource Referral / Chronic Care Management: CRR required this visit?  No   CCM required this visit?  No     Plan:     I have personally reviewed and noted  the following in the patient's chart:   Medical and social history Use of alcohol, tobacco or illicit drugs  Current medications and supplements including opioid prescriptions. Patient is not currently taking opioid prescriptions. Functional ability and status Nutritional status Physical activity Advanced directives List of other physicians Hospitalizations, surgeries, and ER visits in previous 12 months Vitals Screenings to include cognitive, depression, and falls Referrals and appointments  In addition, I have reviewed and discussed with patient certain preventive protocols, quality metrics, and best practice recommendations. A written personalized care plan for preventive services as well as general preventive health recommendations were provided to patient.     Barb Merino,  LPN   1/61/0960   After Visit Summary: (MyChart) Due to this being a telephonic visit, the after visit summary with patients personalized plan was offered to patient via MyChart   Nurse Notes: none

## 2023-07-20 ENCOUNTER — Other Ambulatory Visit: Payer: Self-pay | Admitting: Nurse Practitioner

## 2023-07-20 NOTE — Telephone Encounter (Signed)
Requesting: VENLAFAXINE ER 150MG  CAPSULES  Last Visit: 06/18/2023 Next Visit: 06/27/2024 Last Refill: 04/18/2023  Please Advise

## 2023-08-04 DIAGNOSIS — S0081XA Abrasion of other part of head, initial encounter: Secondary | ICD-10-CM | POA: Diagnosis not present

## 2023-08-04 DIAGNOSIS — S0083XA Contusion of other part of head, initial encounter: Secondary | ICD-10-CM | POA: Diagnosis not present

## 2023-08-08 ENCOUNTER — Other Ambulatory Visit: Payer: Self-pay | Admitting: Nurse Practitioner

## 2023-08-13 ENCOUNTER — Other Ambulatory Visit: Payer: Self-pay | Admitting: Nurse Practitioner

## 2023-08-16 ENCOUNTER — Ambulatory Visit: Payer: Medicare HMO | Admitting: Nurse Practitioner

## 2023-08-16 ENCOUNTER — Telehealth: Payer: Self-pay | Admitting: Nurse Practitioner

## 2023-08-16 DIAGNOSIS — F419 Anxiety disorder, unspecified: Secondary | ICD-10-CM

## 2023-08-16 MED ORDER — CLONAZEPAM 0.5 MG PO TABS
0.5000 mg | ORAL_TABLET | Freq: Two times a day (BID) | ORAL | 0 refills | Status: DC | PRN
Start: 2023-08-16 — End: 2023-10-17

## 2023-08-16 NOTE — Addendum Note (Signed)
Addended by: Rodman Pickle A on: 08/16/2023 10:46 AM   Modules accepted: Orders

## 2023-08-16 NOTE — Telephone Encounter (Signed)
LVM that Rx approved and sent to pharmacy. 

## 2023-08-16 NOTE — Telephone Encounter (Signed)
Pt has rescheduled an appt for 12/5. She only has 2 pills left. Can she get enough to get her by until then?  clonazePAM (KLONOPIN) 0.5 MG tablet [[id number]]   [id number] - [place], [place] - [address] AT [facility] OF [address] [place] [address] Phone: [phone]  Fax: [phone]

## 2023-08-17 ENCOUNTER — Encounter: Payer: Self-pay | Admitting: Gastroenterology

## 2023-08-17 ENCOUNTER — Ambulatory Visit: Payer: Medicare HMO | Admitting: Gastroenterology

## 2023-08-17 VITALS — BP 130/80 | HR 75 | Ht 66.0 in | Wt 174.0 lb

## 2023-08-17 DIAGNOSIS — E785 Hyperlipidemia, unspecified: Secondary | ICD-10-CM

## 2023-08-17 DIAGNOSIS — K760 Fatty (change of) liver, not elsewhere classified: Secondary | ICD-10-CM

## 2023-08-17 DIAGNOSIS — K219 Gastro-esophageal reflux disease without esophagitis: Secondary | ICD-10-CM | POA: Diagnosis not present

## 2023-08-17 DIAGNOSIS — F32A Depression, unspecified: Secondary | ICD-10-CM

## 2023-08-17 DIAGNOSIS — K7581 Nonalcoholic steatohepatitis (NASH): Secondary | ICD-10-CM

## 2023-08-17 DIAGNOSIS — K5904 Chronic idiopathic constipation: Secondary | ICD-10-CM

## 2023-08-17 MED ORDER — PANTOPRAZOLE SODIUM 40 MG PO TBEC: 40.0000 mg | DELAYED_RELEASE_TABLET | Freq: Two times a day (BID) | ORAL | 3 refills | Status: AC

## 2023-08-17 MED ORDER — FAMOTIDINE 20 MG PO TABS: 20.0000 mg | ORAL_TABLET | Freq: Every day | ORAL | 3 refills | Status: DC

## 2023-08-17 MED ORDER — POLYETHYLENE GLYCOL 3350 17 G PO PACK: 17.0000 g | PACK | ORAL | 3 refills | Status: AC | PRN

## 2023-08-17 NOTE — Progress Notes (Signed)
Lisa Spence    [id number]    [birth year]  Primary Care Physician:McElwee, Jake Church, NP  Referring Physician: Gerre Scull, NP [address] [place],  [place] [address]   Chief complaint: GERD, fatty liver  Discussed the use of AI scribe software for clinical note transcription with the patient, who gave verbal consent to proceed.  History of Present Illness   The patient, with a known history of fatty liver disease, presented for a routine follow-up. She reported a fluctuation in liver tests, with values in the forties and sixties, higher than the desired twenties and thirties. The patient also mentioned an active lifestyle, including playing softball and walking two miles daily. She reported an early eating schedule, with the last meal typically around three or four in the afternoon, and a diet rich in whole grains, beans, and fish, with a recent reduction in cheese consumption.  The patient also has a history of gastroesophageal reflux disease (GERD), managed with pantoprazole and famotidine. She reported an improvement in symptoms with the current regimen, but still experiences occasional belching and a peculiar cough. The patient expressed a desire to avoid surgical intervention for GERD and hopes to discontinue the pantoprazole in the spring.  In addition to the above, the patient has been on Effexor, an antidepressant, for less than a year due to significant stress related to family members' health issues. She plans to discontinue this medication in the spring. The patient also reported a recent fall while playing softball, resulting in bruising but no significant injury. She has been managing her weight and reported a weight loss since the last visit.          Outpatient Encounter Medications as of 08/17/2023  Medication Sig   albuterol (VENTOLIN HFA) 108 (90 Base) MCG/ACT inhaler INHALE 2 PUFFS INTO THE LUNGS EVERY 6 HOURS AS NEEDED FOR WHEEZING OR  SHORTNESS OF BREATH   calcium carbonate (OS-CAL) 1250 (500 Ca) MG chewable tablet Chew 1 tablet by mouth daily.   cholecalciferol (VITAMIN D3) 25 MCG (1000 UNIT) tablet Take 1,000 Units by mouth daily.   clonazePAM (KLONOPIN) 0.5 MG tablet Take 1 tablet (0.5 mg total) by mouth 2 (two) times daily as needed for anxiety.   cloNIDine (CATAPRES) 0.1 MG tablet TAKE 1 TABLET(0.1 MG) BY MOUTH DAILY   Efinaconazole 10 % SOLN Apply 1 application  topically daily.   hydrocortisone 2.5 % cream Apply topically daily.   ketoconazole (NIZORAL) 2 % cream Apply topically daily.   Misc Natural Products (FOCUSED MIND PO) Take by mouth. Daily vitamins   Multiple Vitamins-Minerals (ONE A DAY WOMEN 50 PLUS PO) Take 1 tablet by mouth daily.   nystatin cream (MYCOSTATIN) Apply topically 2 (two) times daily.   Omega-3 Fatty Acids (FISH OIL OMEGA-3 PO) Take 1 capsule by mouth daily.   PREMARIN vaginal cream INSERT 1 APPLICATORFUL VAGINALLY 2 TIMES A WEEK   venlafaxine XR (EFFEXOR-XR) 150 MG 24 hr capsule TAKE 1 CAPSULE(150 MG) BY MOUTH DAILY WITH BREAKFAST   [DISCONTINUED] famotidine (PEPCID) 20 MG tablet Take 1 tablet (20 mg total) by mouth at bedtime.   [DISCONTINUED] pantoprazole (PROTONIX) 40 MG tablet Take 1 tablet (40 mg total) by mouth 2 (two) times daily.   [DISCONTINUED] polyethylene glycol (MIRALAX / GLYCOLAX) 17 g packet Take 17 g by mouth as needed.   famotidine (PEPCID) 20 MG tablet Take 1 tablet (20 mg total) by mouth at bedtime.   pantoprazole (  PROTONIX) 40 MG tablet Take 1 tablet (40 mg total) by mouth 2 (two) times daily.   polyethylene glycol (MIRALAX / GLYCOLAX) 17 g packet Take 17 g by mouth as needed.   [DISCONTINUED] benzonatate (TESSALON) 100 MG capsule Take 1 capsule (100 mg total) by mouth 2 (two) times daily as needed for cough. (Patient not taking: Reported on 06/18/2023)   [DISCONTINUED] bimatoprost (LATISSE) 0.03 % ophthalmic solution 1 Application daily.   [DISCONTINUED] HYDROcodone  bit-homatropine (HYCODAN) 5-1.5 MG/5ML syrup Take 5 mLs by mouth every 8 (eight) hours as needed for cough. (Patient not taking: Reported on 06/18/2023)   No facility-administered encounter medications on file as of 08/17/2023.    Allergies as of 08/17/2023   (No Known Allergies)    Past Medical History:  Diagnosis Date   Allergy    Anxiety    Fatty liver    GERD (gastroesophageal reflux disease)    Internal hemorrhoids    Osteoporosis     Past Surgical History:  Procedure Laterality Date   ABDOMINAL HYSTERECTOMY     COLONOSCOPY     ESOPHAGEAL MANOMETRY N/A 12/20/2022   Procedure: ESOPHAGEAL MANOMETRY (EM);  Surgeon: Napoleon Form, MD;  Location: [facility];  Service: Gastroenterology;  Laterality: N/A;   ORIF ANKLE FRACTURE Left 07/09/2017   Procedure: OPEN REDUCTION INTERNAL FIXATION (ORIF) ANKLE FRACTURE;  Surgeon: Yolonda Kida, MD;  Location: [facility] OR;  Service: Orthopedics;  Laterality: Left;  90 mins    Family History  Problem Relation Age of Onset   Heart disease Mother    Heart disease Father    Heart disease Maternal Grandmother    Heart disease Maternal Grandfather    Heart disease Paternal Grandmother    Heart disease Paternal Grandfather    Colon cancer Neg Hx    Esophageal cancer Neg Hx    Rectal cancer Neg Hx    Stomach cancer Neg Hx     Social History   Socioeconomic History   Marital status: Single    Spouse name: Not on file   Number of children: Not on file   Years of education: Not on file   Highest education level: Bachelor's degree (e.g., BA, AB, BS)  Occupational History   Not on file  Tobacco Use   Smoking status: Never   Smokeless tobacco: Never  Vaping Use   Vaping status: Never Used  Substance and Sexual Activity   Alcohol use: No    Comment: no longer drinks, drank 6 glasses wine/week stopped in 2007   Drug use: No   Sexual activity: Not Currently  Other Topics Concern   Not on file  Social History Narrative    Not on file   Social Determinants of Health   Financial Resource Strain: Low Risk  (08/16/2023)   Overall Financial Resource Strain (CARDIA)    Difficulty of Paying Living Expenses: Not hard at all  Food Insecurity: No Food Insecurity (08/16/2023)   Hunger Vital Sign    Worried About Running Out of Food in the Last Year: Never true    Ran Out of Food in the Last Year: Never true  Transportation Needs: No Transportation Needs (08/16/2023)   PRAPARE - Administrator, Civil Service (Medical): No    Lack of Transportation (Non-Medical): No  Physical Activity: Sufficiently Active (08/16/2023)   Exercise Vital Sign    Days of Exercise per Week: 7 days    Minutes of Exercise per Session: 50 min  Stress: Stress Concern Present (  08/16/2023)   [place] [employer] - Occupational Stress Questionnaire    Feeling of Stress : To some extent  Social Connections: Moderately Integrated (08/16/2023)   Social Connection and Isolation Panel [NHANES]    Frequency of Communication with Friends and Family: More than three times a week    Frequency of Social Gatherings with Friends and Family: Three times a week    Attends Religious Services: More than 4 times per year    Active Member of Clubs or Organizations: Yes    Attends Banker Meetings: More than 4 times per year    Marital Status: Never married  Intimate Partner Violence: Not At Risk (06/25/2023)   Humiliation, Afraid, Rape, and Kick questionnaire    Fear of Current or Ex-Partner: No    Emotionally Abused: No    Physically Abused: No    Sexually Abused: No      Review of systems: All other review of systems negative except as mentioned in the HPI.   Physical Exam: Vitals:   08/17/23 0851  BP: 130/80  Pulse: 75   Body mass index is 28.08 kg/m. Gen:      No acute distress HEENT:  sclera anicteric Abd:      soft, non-tender; no palpable masses, no distension Ext:    No edema Neuro:  alert and oriented x 3 Psych: normal mood and affect  Data Reviewed:  Reviewed labs, radiology imaging, old records and pertinent past GI work up  Results   LABS    Latest Ref Rng & Units 02/15/2023    8:15 AM 08/04/2022    7:28 AM 06/21/2022   12:35 PM  Hepatic Function  Total Protein 6.0 - 8.3 g/dL 6.9  7.3  7.5   Albumin 3.5 - 5.2 g/dL 4.6  4.8  4.7   AST 0 - 37 U/L 41  33  33   ALT 0 - 35 U/L 67  52  55   Alk Phosphatase 39 - 117 U/L 58  93  94   Total Bilirubin 0.2 - 1.2 mg/dL 0.4  0.4  0.4   Bilirubin, Direct 0.0 - 0.3 mg/dL  0.1  0.1      RADIOLOGY Ultrasound: No fibrosis (06/2022)  DIAGNOSTIC Elastography: 2.3 (06/2022) Colonoscopy: Normal (11/2022)       Assessment and Plan/Recommendations:      Fatty Liver Disease Persistent mild elevation of liver enzymes (AST, ALT) in the 40s-60s. No evidence of fibrosis on previous elastography. Patient is adhering to a healthy diet and regular exercise regimen. -Continue current lifestyle modifications. -Check liver function tests in the next few weeks. -Plan for repeat ultrasound in spring/summer 2025.  Hyperlipidemia Borderline high LDL cholesterol. -Continue current diet and exercise regimen.  Gastroesophageal Reflux Disease (GERD) Patient reports improvement in symptoms with current regimen of Pantoprazole twice daily and Pepcid at bedtime as needed. -Continue Pantoprazole twice daily, 30-60 minutes before meals. -Continue Pepcid at bedtime as needed. -Consider additional dose of Pantoprazole or Pepcid on days with heavier meals or increased symptoms.  Depression Patient reports benefit from current regimen of Effexor. -Continue Effexor as prescribed.  Colon Cancer Screening Last colonoscopy was in March 2024, next due in 2027. -No further action needed at this time.  General Health Maintenance -Continue current exercise regimen and diet. -Continue Miralax as needed for bowel regularity. -Continue Prolia  for osteoporosis prevention. -Continue daily multivitamin to counter potential decreased absorption from Pantoprazole. -Check liver function tests in the next few weeks. -  Plan for repeat ultrasound in spring/summer 2025.       The patient was provided an opportunity to ask questions and all were answered. The patient agreed with the plan and demonstrated an understanding of the instructions.  Iona Beard , MD    CC: Gerre Scull, NP

## 2023-08-17 NOTE — Patient Instructions (Addendum)
VISIT SUMMARY:  During your visit today, we reviewed your ongoing health conditions, including fatty liver disease, GERD, and depression. We discussed your current lifestyle and diet, and you reported maintaining an active lifestyle and a healthy diet. We also reviewed your medication regimen and made plans for future follow-ups and tests.  YOUR PLAN:  -FATTY LIVER DISEASE: Fatty liver disease is a condition where fat builds up in the liver. Your liver enzyme levels are slightly elevated, but you are following a healthy diet and exercise routine. We will check your liver function tests in the next few weeks and plan for a repeat ultrasound in spring/summer 2025.  -HYPERLIPIDEMIA: Hyperlipidemia means you have higher levels of fats (lipids) in your blood, particularly LDL cholesterol. Continue with your current diet and exercise regimen to manage this condition.  -GASTROESOPHAGEAL REFLUX DISEASE (GERD): GERD is a condition where stomach acid frequently flows back into the tube connecting your mouth and stomach. You are experiencing improvement with your current medications, Pantoprazole and Pepcid. Continue taking Pantoprazole twice daily before meals and Pepcid at bedtime as needed. You may take an additional dose on days with heavier meals or increased symptoms.  -DEPRESSION: Depression is a mood disorder that causes persistent feelings of sadness and loss of interest. You are currently benefiting from Effexor. Continue taking Effexor as prescribed.  -COLON CANCER SCREENING: Your last colonoscopy was in March 2024, and the next one is due in 2029. No further action is needed at this time.  -GENERAL HEALTH MAINTENANCE: Continue your current exercise regimen and diet. Use Miralax as needed for bowel regularity and continue taking Prolia for osteoporosis prevention. Keep taking a daily multivitamin to counter potential decreased absorption from Pantoprazole. We will check your liver function tests in  the next few weeks and plan for a repeat ultrasound in spring/summer 2025.  INSTRUCTIONS:  Please schedule an appointment for liver function tests in the next few weeks. We will also plan for a repeat ultrasound in spring/summer 2025.  Your provider has requested that you go to the basement level for lab work before leaving today. Press "B" on the elevator. The lab is located at the first door on the left as you exit the elevator.  _______________________________________________________  If your blood pressure at your visit was 140/90 or greater, please contact your primary care physician to follow up on this.  _______________________________________________________  If you are age 67 or older, your body mass index should be between 23-30. Your Body mass index is 28.08 kg/m. If this is out of the aforementioned range listed, please consider follow up with your Primary Care Provider.  If you are age 38 or younger, your body mass index should be between 19-25. Your Body mass index is 28.08 kg/m. If this is out of the aformentioned range listed, please consider follow up with your Primary Care Provider.   ________________________________________________________  The [facility] providers would like to encourage you to use [facility] to communicate with providers for non-urgent requests or questions.  Due to long hold times on the telephone, sending your provider a message by [facility] may be a faster and more efficient way to get a response.  Please allow 48 business hours for a response.  Please remember that this is for non-urgent requests.  _______________________________________________________  Due to recent changes in healthcare laws, you may see the results of your imaging and laboratory studies on MyChart before your provider has had a chance to review them.  We understand that in some cases  there may be results that are confusing or concerning to you. Not all laboratory results come back in  the same time frame and the provider may be waiting for multiple results in order to interpret others.  Please give [place] 48 hours in order for your provider to thoroughly review all the results before contacting the office for clarification of your results.

## 2023-08-20 ENCOUNTER — Telehealth: Payer: Self-pay | Admitting: Nurse Practitioner

## 2023-08-20 NOTE — Telephone Encounter (Signed)
I called and spoke with pharmacy tech at [facility] and patient's Rx of Clonidine is ready for pick up.  I called and left patient a voicemail that Rx filled and ready for pick up at [facility].

## 2023-08-20 NOTE — Telephone Encounter (Signed)
Prescription Request  08/20/2023  LOV: 06/18/2023  What is the name of the medication or equipment? cloNIDine (CATAPRES) 0.1 MG tablet [[id number]]   Have you contacted your pharmacy to request a refill? No   Which pharmacy would you like this sent to?    [id number] [place], [place] - [address] AT [facility] [address] [place] [address] Phone: [phone] Fax: [phone]  Patient notified that their request is being sent to the clinical staff for review and that they should receive a response within 2 business days.   Please advise at Mobile [phone] (mobile)

## 2023-08-20 NOTE — Telephone Encounter (Signed)
Pt is stating the pharmacy let her know this script is not ready for pick up. Please advise pt, she will be out tomorrow 08/21/23. Pt at   [phone] [facility])

## 2023-08-20 NOTE — Telephone Encounter (Signed)
I called and spoke with patient and notified her that Rx was refilled on 08-15-23 and she said that she did not receive medication when she went to pharmacy to pick up other prescriptions on Saturday. I told patient that I will call the pharmacy to check on this and call her back.

## 2023-08-20 NOTE — Telephone Encounter (Signed)
I called and spoke with Edson Snowball at [facility] pharmacy regarding patient's medication and she confused it with Clonazepam Rx. Edson Snowball said that Rx was not received at pharmacy on 08-15-23 and had to give her the verbal order for the Clonidine.   I called patient afterwards and notified her of Rx refill and should be ready at the pharmacy now and to keep follow up appointment on 08/30/23

## 2023-08-21 ENCOUNTER — Ambulatory Visit: Payer: Medicare HMO | Admitting: Podiatry

## 2023-08-21 DIAGNOSIS — B351 Tinea unguium: Secondary | ICD-10-CM | POA: Diagnosis not present

## 2023-08-21 DIAGNOSIS — M79675 Pain in left toe(s): Secondary | ICD-10-CM | POA: Diagnosis not present

## 2023-08-21 DIAGNOSIS — M79674 Pain in right toe(s): Secondary | ICD-10-CM

## 2023-08-21 NOTE — Progress Notes (Signed)
Subjective: No chief complaint on file.    72 year old female presents the office today for concerns of thick, elongated toenails that she can has difficulty trimming herself for nail fungus.  She gets some pain to the 3rd through 5th toes in the left foot on the area the hammertoes at times.  No open lesions.  No injuries.  No other concerns.  Objective: AAO x3, NAD DP/PT pulses palpable bilaterally, CRT less than 3 seconds Nails are hypertrophic, dystrophic, brittle, discolored, elongated 10. No surrounding redness or drainage. Tenderness nails 1-5 bilaterally.  There is minimal callus formation on the medial first MPJ bilaterally without any underlying ulceration drainage or signs of infection.  Minimal hyperkeratotic lesion submetatarsal 4 left foot without any underlying ulceration drainage or any signs of infection. Hammertoe contractures noted to digits in the left third toe is rigid, fourth toe with semirigid.  No significant pain on exam today. There is no pain identified to the left ankle. No edema.  No pain with calf compression, swelling, warmth, erythema  Assessment: Symptomatic onychomycosis, hyperkeratotic lesions, hammertoes  Plan: -All treatment options discussed with the patient including all alternatives, risks, complications.  -Sharply to the nails x10 without any complications or bleeding.  Discussed topical medication.  Continue Jublia -Sharply debrided the mild hyperkeratotic lesion x3 without any complications or bleeding as a courtesy as they are minimal.  Discussed moisturizer.  She can use a pumice stone as well to help. -Discussed exercises to help with the toes, good support to help prevent /slow worsening, progression of the hammertoes. -Patient encouraged to call the office with any questions, concerns, change in symptoms.   Return in about 3 months (around 11/21/2023).  Vivi Barrack DPM

## 2023-08-28 ENCOUNTER — Encounter: Payer: Self-pay | Admitting: Gastroenterology

## 2023-08-30 ENCOUNTER — Ambulatory Visit: Payer: Medicare HMO | Admitting: Nurse Practitioner

## 2023-08-30 ENCOUNTER — Encounter: Payer: Self-pay | Admitting: Nurse Practitioner

## 2023-08-30 VITALS — BP 110/70 | HR 83 | Temp 97.8°F | Ht 66.0 in | Wt 176.8 lb

## 2023-08-30 DIAGNOSIS — R0602 Shortness of breath: Secondary | ICD-10-CM

## 2023-08-30 DIAGNOSIS — K5904 Chronic idiopathic constipation: Secondary | ICD-10-CM

## 2023-08-30 DIAGNOSIS — K2100 Gastro-esophageal reflux disease with esophagitis, without bleeding: Secondary | ICD-10-CM

## 2023-08-30 DIAGNOSIS — N951 Menopausal and female climacteric states: Secondary | ICD-10-CM | POA: Diagnosis not present

## 2023-08-30 NOTE — Assessment & Plan Note (Signed)
She reports a persistent morning cough without other concerning symptoms. We will start Flonase and an over-the-counter allergy pill daily. Continue albuterol inhaler as needed.

## 2023-08-30 NOTE — Patient Instructions (Signed)
It was great to see you!  Start the flonase and allergy pill daily  Keep taking the clonidine   Let's follow-up in 3 months, sooner if you have concerns.  If a referral was placed today, you will be contacted for an appointment. Please note that routine referrals can sometimes take up to 3-4 weeks to process. Please call our office if you haven't heard anything after this time frame.  Take care,  Rodman Pickle, NP

## 2023-08-30 NOTE — Assessment & Plan Note (Signed)
Chronic, stable. Continue pantoprazole 40mg  daily and pepcid as prescribed by GI.

## 2023-08-30 NOTE — Assessment & Plan Note (Signed)
Chronic, stable. After 45-50 days of Clonidine use, she has noted improvement in hot flashes and no adverse effects. We will continue Clonidine 0.1mg  daily and reassess in 3 months.

## 2023-08-30 NOTE — Assessment & Plan Note (Signed)
She reports some relief from constipation using Miralax three times a week and occasional stool softeners, alongside considering dietary changes. We will increase Miralax to every other day and stool softener to twice a week for a month.

## 2023-08-30 NOTE — Progress Notes (Signed)
Established Patient Office Visit  Subjective   Patient ID: Lisa Spence, female    DOB: September 27, 1950  Age: 72 y.o. MRN: [id number]  Chief Complaint  Patient presents with   Medication Management    Follow up    HPI  Discussed the use of AI scribe software for clinical note transcription with the patient, who gave verbal consent to proceed.  History of Present Illness   The patient, with a history of hot flashes, acid reflux, and constipation, presents for a routine follow-up. The patient has been taking clonidine for hot flashes and reports an improvement in symptoms after about 45 days of treatment. The patient also reports a recent fall while playing softball, resulting in minor injuries but no significant complications.  The patient's acid reflux is being managed with pantoprazole and famotidine, and she reports some improvement. However, the patient also mentions a persistent cough and a sensation of tightness in her throat, particularly in the morning. The patient is unsure if this is related to her acid reflux or possibly allergies.  Regarding constipation, the patient is currently taking Miralax three times a week and reports some relief. However, the patient is considering increasing the frequency of Miralax and adding a stool softener to her regimen due to occasional difficulty with bowel movements and associated stress. She exercises regularly and drinks plenty of water.      ROS See pertinent positives and negatives per HPI.    Objective:     BP 110/70 (BP Location: Right Arm, Cuff Size: Normal)   Pulse 83   Temp 97.8 F (36.6 C)   Ht 5\' 6"  (1.676 m)   Wt 176 lb 12.8 oz (80.2 kg)   SpO2 98%   BMI 28.54 kg/m  BP Readings from Last 3 Encounters:  08/30/23 110/70  08/17/23 130/80  06/18/23 132/78   Wt Readings from Last 3 Encounters:  08/30/23 176 lb 12.8 oz (80.2 kg)  08/17/23 174 lb (78.9 kg)  06/18/23 175 lb (79.4 kg)      Physical Exam Vitals and  nursing note reviewed.  Constitutional:      General: She is not in acute distress.    Appearance: Normal appearance.  HENT:     Head: Normocephalic.  Eyes:     Conjunctiva/sclera: Conjunctivae normal.  Cardiovascular:     Rate and Rhythm: Normal rate and regular rhythm.     Pulses: Normal pulses.     Heart sounds: Normal heart sounds.  Pulmonary:     Effort: Pulmonary effort is normal.     Breath sounds: Normal breath sounds.  Musculoskeletal:     Cervical back: Normal range of motion.  Skin:    General: Skin is warm.  Neurological:     General: No focal deficit present.     Mental Status: She is alert and oriented to person, place, and time.  Psychiatric:        Mood and Affect: Mood normal.        Behavior: Behavior normal.        Thought Content: Thought content normal.        Judgment: Judgment normal.    The 10-year ASCVD risk score (Arnett DK, et al., 2019) is: 7.1%    Assessment & Plan:   Problem List Items Addressed This Visit       Cardiovascular and Mediastinum   Hot flashes due to menopause - Primary    Chronic, stable. After 45-50 days of Clonidine use, she has noted  improvement in hot flashes and no adverse effects. We will continue Clonidine 0.1mg  daily and reassess in 3 months.        Digestive   GERD (gastroesophageal reflux disease)    Chronic, stable. Continue pantoprazole 40mg  daily and pepcid as prescribed by GI.        Other   Constipation    She reports some relief from constipation using Miralax three times a week and occasional stool softeners, alongside considering dietary changes. We will increase Miralax to every other day and stool softener to twice a week for a month.      Shortness of breath    She reports a persistent morning cough without other concerning symptoms. We will start Flonase and an over-the-counter allergy pill daily. Continue albuterol inhaler as needed.        Return in about 3 months (around 11/28/2023) for hot  flashes, constipation.    Gerre Scull, NP

## 2023-09-04 DIAGNOSIS — Z8249 Family history of ischemic heart disease and other diseases of the circulatory system: Secondary | ICD-10-CM | POA: Diagnosis not present

## 2023-09-04 DIAGNOSIS — L309 Dermatitis, unspecified: Secondary | ICD-10-CM | POA: Diagnosis not present

## 2023-09-04 DIAGNOSIS — N952 Postmenopausal atrophic vaginitis: Secondary | ICD-10-CM | POA: Diagnosis not present

## 2023-09-04 DIAGNOSIS — M1990 Unspecified osteoarthritis, unspecified site: Secondary | ICD-10-CM | POA: Diagnosis not present

## 2023-09-04 DIAGNOSIS — K760 Fatty (change of) liver, not elsewhere classified: Secondary | ICD-10-CM | POA: Diagnosis not present

## 2023-09-04 DIAGNOSIS — F419 Anxiety disorder, unspecified: Secondary | ICD-10-CM | POA: Diagnosis not present

## 2023-09-04 DIAGNOSIS — M810 Age-related osteoporosis without current pathological fracture: Secondary | ICD-10-CM | POA: Diagnosis not present

## 2023-09-04 DIAGNOSIS — K219 Gastro-esophageal reflux disease without esophagitis: Secondary | ICD-10-CM | POA: Diagnosis not present

## 2023-09-04 DIAGNOSIS — B351 Tinea unguium: Secondary | ICD-10-CM | POA: Diagnosis not present

## 2023-09-04 DIAGNOSIS — I10 Essential (primary) hypertension: Secondary | ICD-10-CM | POA: Diagnosis not present

## 2023-09-04 DIAGNOSIS — F325 Major depressive disorder, single episode, in full remission: Secondary | ICD-10-CM | POA: Diagnosis not present

## 2023-09-04 DIAGNOSIS — E785 Hyperlipidemia, unspecified: Secondary | ICD-10-CM | POA: Diagnosis not present

## 2023-09-12 ENCOUNTER — Telehealth: Payer: Self-pay

## 2023-09-12 DIAGNOSIS — M810 Age-related osteoporosis without current pathological fracture: Secondary | ICD-10-CM

## 2023-09-12 MED ORDER — DENOSUMAB 60 MG/ML ~~LOC~~ SOSY
60.0000 mg | PREFILLED_SYRINGE | Freq: Once | SUBCUTANEOUS | Status: AC
  Administered 2023-11-21: 60 mg via SUBCUTANEOUS

## 2023-09-12 NOTE — Telephone Encounter (Signed)
PCP wants patient to continue Prolia injections for Age-related Osteoporosis. Dx code: M81.0  ASSESSMENT AND PLAN (03/31/2020 - Luana Shu, MD): Age-related osteoporosis without current pathological fracture - pt is taking daily calcium and Vit D supplements, exercising regularly  - pt is agreeable to medication and would like to look into Prolia. If not covered, pt would be agreeable to boniva - will ask lead RN to look into Prolia and then f/u with pt  ASSESSMENT AND PLAN (02/13/2023 - Rodman Pickle, DNP): Osteoporosis       Updated DEXA scan on 09/19/23 showed a T-score of -2.8.  Continue prolia injections, calcium, vitamin D, and exercise. Will recheck DEXA scan in 2 years.

## 2023-09-21 ENCOUNTER — Encounter: Payer: Self-pay | Admitting: Family

## 2023-09-21 ENCOUNTER — Ambulatory Visit (INDEPENDENT_AMBULATORY_CARE_PROVIDER_SITE_OTHER): Payer: Medicare HMO | Admitting: Family

## 2023-09-21 VITALS — BP 130/78 | HR 94 | Temp 99.0°F | Wt 174.0 lb

## 2023-09-21 DIAGNOSIS — J069 Acute upper respiratory infection, unspecified: Secondary | ICD-10-CM

## 2023-09-21 DIAGNOSIS — J029 Acute pharyngitis, unspecified: Secondary | ICD-10-CM | POA: Diagnosis not present

## 2023-09-21 DIAGNOSIS — R059 Cough, unspecified: Secondary | ICD-10-CM

## 2023-09-21 LAB — POCT INFLUENZA A/B
Influenza A, POC: NEGATIVE
Influenza B, POC: NEGATIVE

## 2023-09-21 LAB — POC COVID19 BINAXNOW: SARS Coronavirus 2 Ag: NEGATIVE

## 2023-09-21 LAB — POCT RAPID STREP A (OFFICE): Rapid Strep A Screen: NEGATIVE

## 2023-09-21 MED ORDER — BENZONATATE 200 MG PO CAPS: 200.0000 mg | ORAL_CAPSULE | Freq: Three times a day (TID) | ORAL | 0 refills | Status: DC | PRN

## 2023-09-21 MED ORDER — PREDNISONE 10 MG (21) PO TBPK: ORAL_TABLET | ORAL | 0 refills | Status: DC

## 2023-09-21 NOTE — Progress Notes (Signed)
Acute Office Visit  Subjective:     Patient ID: Lisa Spence, female    DOB: 06/03/1951, 72 y.o.   MRN: [id number]  Chief Complaint  Patient presents with  . Sore Throat    Sore throat,cough, sneezing fever (100) and right ear pain x Monday Tested neg for covid at home    HPI Patient is in today with complaints of sore throat, cough, congestion, sneezing, right ear pain x 4 days.  She has tested for COVID that was negative.  Denies any sick contacts.  Has been taking over-the-counter medication without much relief.  Review of Systems  Constitutional:  Positive for fever. Negative for chills.  HENT:  Positive for congestion.   Respiratory:  Positive for cough.   Gastrointestinal: Negative.   Musculoskeletal: Negative.   Neurological: Negative.   Psychiatric/Behavioral: Negative.    All other systems reviewed and are negative. Past Medical History:  Diagnosis Date  . Allergy   . Anxiety   . Fatty liver   . GERD (gastroesophageal reflux disease)   . Internal hemorrhoids   . Osteoporosis     Social History   Socioeconomic History  . Marital status: Single    Spouse name: Not on file  . Number of children: Not on file  . Years of education: Not on file  . Highest education level: Bachelor's degree (e.g., BA, AB, BS)  Occupational History  . Not on file  Tobacco Use  . Smoking status: Never  . Smokeless tobacco: Never  Vaping Use  . Vaping status: Never Used  Substance and Sexual Activity  . Alcohol use: No    Comment: no longer drinks, drank 6 glasses wine/week stopped in 2007  . Drug use: No  . Sexual activity: Not Currently  Other Topics Concern  . Not on file  Social History Narrative  . Not on file   Social Drivers of Health   Financial Resource Strain: Low Risk  (08/16/2023)   Overall Financial Resource Strain (CARDIA)   . Difficulty of Paying Living Expenses: Not hard at all  Food Insecurity: No Food Insecurity (08/16/2023)   Hunger Vital Sign    . Worried About Programme researcher, broadcasting/film/video in the Last Year: Never true   . Ran Out of Food in the Last Year: Never true  Transportation Needs: No Transportation Needs (08/16/2023)   PRAPARE - Transportation   . Lack of Transportation (Medical): No   . Lack of Transportation (Non-Medical): No  Physical Activity: Sufficiently Active (08/16/2023)   Exercise Vital Sign   . Days of Exercise per Week: 7 days   . Minutes of Exercise per Session: 50 min  Stress: Stress Concern Present (08/16/2023)   [employer] of Occupational Health - Occupational Stress Questionnaire   . Feeling of Stress : To some extent  Social Connections: Moderately Integrated (08/16/2023)   Social Connection and Isolation Panel [NHANES]   . Frequency of Communication with Friends and Family: More than three times a week   . Frequency of Social Gatherings with Friends and Family: Three times a week   . Attends Religious Services: More than 4 times per year   . Active Member of Clubs or Organizations: Yes   . Attends Banker Meetings: More than 4 times per year   . Marital Status: Never married  Intimate Partner Violence: Not At Risk (06/25/2023)   Humiliation, Afraid, Rape, and Kick questionnaire   . Fear of Current or Ex-Partner: No   . Emotionally  Abused: No   . Physically Abused: No   . Sexually Abused: No    Past Surgical History:  Procedure Laterality Date  . ABDOMINAL HYSTERECTOMY    . COLONOSCOPY    . ESOPHAGEAL MANOMETRY N/A 12/20/2022   Procedure: ESOPHAGEAL MANOMETRY (EM);  Surgeon: Napoleon Form, MD;  Location: [facility];  Service: Gastroenterology;  Laterality: N/A;  . ORIF ANKLE FRACTURE Left 07/09/2017   Procedure: OPEN REDUCTION INTERNAL FIXATION (ORIF) ANKLE FRACTURE;  Surgeon: Yolonda Kida, MD;  Location: [facility];  Service: Orthopedics;  Laterality: Left;  90 mins    Family History  Problem Relation Age of Onset  . Heart disease Mother   . Heart disease Father    . Heart disease Maternal Grandmother   . Heart disease Maternal Grandfather   . Heart disease Paternal Grandmother   . Heart disease Paternal Grandfather   . Colon cancer Neg Hx   . Esophageal cancer Neg Hx   . Rectal cancer Neg Hx   . Stomach cancer Neg Hx     No Known Allergies  Current Outpatient Medications on File Prior to Visit  Medication Sig Dispense Refill  . albuterol (VENTOLIN HFA) 108 (90 Base) MCG/ACT inhaler INHALE 2 PUFFS INTO THE LUNGS EVERY 6 HOURS AS NEEDED FOR WHEEZING OR SHORTNESS OF BREATH 6.7 g 2  . calcium carbonate (OS-CAL) 1250 (500 Ca) MG chewable tablet Chew 1 tablet by mouth daily.    . cholecalciferol (VITAMIN D3) 25 MCG (1000 UNIT) tablet Take 1,000 Units by mouth daily.    . clonazePAM (KLONOPIN) 0.5 MG tablet Take 1 tablet (0.5 mg total) by mouth 2 (two) times daily as needed for anxiety. 60 tablet 0  . cloNIDine (CATAPRES) 0.1 MG tablet TAKE 1 TABLET(0.1 MG) BY MOUTH DAILY 90 tablet 1  . Efinaconazole 10 % SOLN Apply 1 application  topically daily. 8 mL 2  . famotidine (PEPCID) 20 MG tablet Take 1 tablet (20 mg total) by mouth at bedtime. 90 tablet 3  . hydrocortisone 2.5 % cream Apply topically daily.    Marland Kitchen ketoconazole (NIZORAL) 2 % cream Apply topically daily.    . Misc Natural Products (FOCUSED MIND PO) Take by mouth. Daily vitamins    . Multiple Vitamins-Minerals (ONE A DAY WOMEN 50 PLUS PO) Take 1 tablet by mouth daily.    Marland Kitchen nystatin cream (MYCOSTATIN) Apply topically 2 (two) times daily.    . Omega-3 Fatty Acids (FISH OIL OMEGA-3 PO) Take 1 capsule by mouth daily.    . pantoprazole (PROTONIX) 40 MG tablet Take 1 tablet (40 mg total) by mouth 2 (two) times daily. 180 tablet 3  . polyethylene glycol (MIRALAX / GLYCOLAX) 17 g packet Take 17 g by mouth as needed. 14 each 3  . PREMARIN vaginal cream INSERT 1 APPLICATORFUL VAGINALLY 2 TIMES A WEEK 30 g 5  . venlafaxine XR (EFFEXOR-XR) 150 MG 24 hr capsule TAKE 1 CAPSULE(150 MG) BY MOUTH DAILY WITH  BREAKFAST 90 capsule 0   Current Facility-Administered Medications on File Prior to Visit  Medication Dose Route Frequency Provider Last Rate Last Admin  . [START ON 11/09/2023] denosumab (PROLIA) injection 60 mg  60 mg Subcutaneous Once McElwee, Lauren A, NP        BP 130/78   Pulse 94   Temp 99 F (37.2 C) (Temporal)   Wt 174 lb (78.9 kg)   SpO2 98%   BMI 28.08 kg/m chart      Objective:    BP 130/78  Pulse 94   Temp 99 F (37.2 C) (Temporal)   Wt 174 lb (78.9 kg)   SpO2 98%   BMI 28.08 kg/m    Physical Exam Vitals and nursing note reviewed.  Constitutional:      Appearance: She is well-developed.  HENT:     Right Ear: Tympanic membrane normal.     Left Ear: Tympanic membrane and ear canal normal.     Mouth/Throat:     Mouth: Mucous membranes are moist.  Cardiovascular:     Rate and Rhythm: Normal rate and regular rhythm.  Pulmonary:     Effort: Pulmonary effort is normal.     Breath sounds: Normal breath sounds.  Musculoskeletal:     Cervical back: Normal range of motion and neck supple.  Skin:    General: Skin is warm and dry.  Neurological:     General: No focal deficit present.     Mental Status: She is alert and oriented to person, place, and time.  Psychiatric:        Mood and Affect: Mood normal.        Behavior: Behavior normal.   No results found for any visits on 09/21/23.      Assessment & Plan:   Problem List Items Addressed This Visit   None Visit Diagnoses       Upper respiratory tract infection, unspecified type    -  Primary     Sore throat       Relevant Orders   POCT rapid strep A     Cough, unspecified type       Relevant Orders   POC COVID-19 BinaxNow   POCT Influenza A/B       Meds ordered this encounter  Medications  . predniSONE (STERAPRED UNI-PAK 21 TAB) 10 MG (21) TBPK tablet    Sig: As directed    Dispense:  21 tablet    Refill:  0  . benzonatate (TESSALON) 200 MG capsule    Sig: Take 1 capsule (200 mg  total) by mouth 3 (three) times daily as needed for cough.    Dispense:  30 capsule    Refill:  0   Call the office if symptoms worsen or persist.  Rest.  Drink plenty of fluids. No follow-ups on file.  Eulis Foster, FNP

## 2023-10-01 ENCOUNTER — Telehealth: Payer: Self-pay

## 2023-10-01 ENCOUNTER — Ambulatory Visit: Payer: Self-pay | Admitting: Nurse Practitioner

## 2023-10-01 NOTE — Telephone Encounter (Signed)
 Copied from CRM [id number]. Topic: Clinical - Pink Word Triage >> Oct 01, 2023  2:34 PM Burnard DEL wrote: Reason for Triage: Patient was treated for cold symptoms x2 weeks ago, is not feeling better, now additionally has pain in ears. Has appointment for 1/8 with PCP, would like someone to call today with advice on medications to take or treatment.   Chief Complaint: Sinus congestion Symptoms: Sinus congestion, ear pain, sore throat  Frequency: Constant  Pertinent Negatives: Patient denies fever Disposition: [] ED /[] Urgent Care (no appt availability in office) / [x] Appointment(In office/virtual)/ []  [facility] [place] [facility]/ [] Home Care/ [] Refused Recommended Disposition /[] [facility]/ []  Follow-up with PCP Additional Notes: Patient report she has been experiencing congestion, sore throat, and right ear pain for the last 2 weeks. She reports that she finished taking Prednisone  and today her symptoms began to worsen. She denies any current fever. She states she has an appointment on 10/03/23 and wanted to know if her PCP Lauren could call her in a prescription for her symptoms.     Reason for Disposition  Earache  Answer Assessment - Initial Assessment Questions 1. LOCATION: Where does it hurt?      Ear pain, sore throat  2. ONSET: When did the sinus pain start?  (e.g., hours, days)      2 weeks  3. SEVERITY: How bad is the pain?   (Scale 1-10; mild, moderate or severe)   - MILD (1-3): doesn't interfere with normal activities    - MODERATE (4-7): interferes with normal activities (e.g., work or school) or awakens from sleep   - SEVERE (8-10): excruciating pain and patient unable to do any normal activities        5/10 4. RECURRENT SYMPTOM: Have you ever had sinus problems before? If Yes, ask: When was the last time? and What happened that time?      Yes 5. NASAL CONGESTION: Is the nose blocked? If Yes, ask: Can you open it or must you breathe through your mouth?      Yes 6. NASAL DISCHARGE: Do you have discharge from your nose? If so ask, What color?     Clear drainage  7. FEVER: Do you have a fever? If Yes, ask: What is it, how was it measured, and when did it start?      No 8. OTHER SYMPTOMS: Do you have any other symptoms? (e.g., sore throat, cough, earache, difficulty breathing)     Right ear pain  Protocols used: Sinus Pain or Congestion-A-AH

## 2023-10-01 NOTE — Telephone Encounter (Signed)
 Copied from CRM [id number]. Topic: Clinical - Medical Advice >> Oct 01, 2023  8:33 AM Powell B wrote: Reason for CRM: Patient was treated for cold symptoms x2 weeks ago, is not feeling better, now additionally has pain in ears. Has appointment for 1/8 with PCP, would like someone to call today with advice on medications to take or treatment.

## 2023-10-02 NOTE — Telephone Encounter (Signed)
 Called patient; no answer left brief VM.

## 2023-10-03 ENCOUNTER — Ambulatory Visit: Payer: Medicare HMO | Admitting: Nurse Practitioner

## 2023-10-03 ENCOUNTER — Encounter: Payer: Self-pay | Admitting: Nurse Practitioner

## 2023-10-03 VITALS — BP 122/80 | HR 80 | Temp 98.3°F | Ht 66.0 in | Wt 176.0 lb

## 2023-10-03 DIAGNOSIS — J0100 Acute maxillary sinusitis, unspecified: Secondary | ICD-10-CM

## 2023-10-03 MED ORDER — AMOXICILLIN-POT CLAVULANATE 875-125 MG PO TABS: 1.0000 | ORAL_TABLET | Freq: Two times a day (BID) | ORAL | 0 refills | Status: DC

## 2023-10-03 MED ORDER — FLUCONAZOLE 150 MG PO TABS
150.0000 mg | ORAL_TABLET | Freq: Once | ORAL | 0 refills | Status: AC
Start: 2023-10-03 — End: 2023-10-03

## 2023-10-03 NOTE — Progress Notes (Signed)
 Acute Office Visit  Subjective:     Patient ID: Lisa Spence, female    DOB: 07-06-1951, 73 y.o.   MRN: [id number]  Chief Complaint  Patient presents with   Head Congestion    With right side facial pain, cough, right ear pain    HPI Patient is in today for congestion, sinus pain, ear pain for 2 weeks. She was treated 2  weeks ago with prednisone  and tessalon .   UPPER RESPIRATORY TRACT INFECTION  Fever: no Cough: yes Shortness of breath: yes - better Wheezing: no Chest pain: no Chest tightness: no Chest congestion: no Nasal congestion: yes Runny nose: no Post nasal drip: yes Sneezing: no Sore throat: yes Swollen glands: no Sinus pressure: yes Headache: yes Face pain: yes Toothache: no Ear pain: yes right Ear pressure: no bilateral Eyes red/itching:no Eye drainage/crusting: yes - right Vomiting: no Rash: no Fatigue: yes Sick contacts: no Strep contacts: no  Context: worse Recurrent sinusitis: no Relief with OTC cold/cough medications: no  Treatments attempted: prednisone , tessalon    ROS See pertinent positives and negatives per HPI.     Objective:    BP 122/80 (BP Location: Right Arm, Patient Position: Sitting, Cuff Size: Normal)   Pulse 80   Temp 98.3 F (36.8 C) (Oral)   Ht 5' 6 (1.676 m)   Wt 176 lb (79.8 kg)   SpO2 98%   BMI 28.41 kg/m    Physical Exam Vitals and nursing note reviewed.  Constitutional:      General: She is not in acute distress.    Appearance: Normal appearance.  HENT:     Head: Normocephalic.     Right Ear: Tympanic membrane, ear canal and external ear normal.     Left Ear: Tympanic membrane, ear canal and external ear normal.     Nose:     Right Sinus: Maxillary sinus tenderness present. No frontal sinus tenderness.     Left Sinus: No maxillary sinus tenderness or frontal sinus tenderness.  Eyes:     Conjunctiva/sclera: Conjunctivae normal.  Cardiovascular:     Rate and Rhythm: Normal rate and regular  rhythm.     Pulses: Normal pulses.     Heart sounds: Normal heart sounds.  Pulmonary:     Effort: Pulmonary effort is normal.     Breath sounds: Normal breath sounds.  Musculoskeletal:     Cervical back: Normal range of motion and neck supple. No tenderness.  Lymphadenopathy:     Cervical: No cervical adenopathy.  Skin:    General: Skin is warm.  Neurological:     General: No focal deficit present.     Mental Status: She is alert and oriented to person, place, and time.  Psychiatric:        Mood and Affect: Mood normal.        Behavior: Behavior normal.        Thought Content: Thought content normal.        Judgment: Judgment normal.       Assessment & Plan:   Problem List Items Addressed This Visit   None Visit Diagnoses       Acute non-recurrent maxillary sinusitis    -  Primary   Start augmentin  twice a day for 10 days. Diflcuan 150mg  x1 after to prevent yeast infection. Encourage fluids, restart flonase .   Relevant Medications   amoxicillin -clavulanate (AUGMENTIN ) 875-125 MG tablet   fluconazole  (DIFLUCAN ) 150 MG tablet       Meds ordered this encounter  Medications   amoxicillin -clavulanate (AUGMENTIN ) 875-125 MG tablet    Sig: Take 1 tablet by mouth 2 (two) times daily.    Dispense:  20 tablet    Refill:  0   fluconazole  (DIFLUCAN ) 150 MG tablet    Sig: Take 1 tablet (150 mg total) by mouth once for 1 dose. After finishing antibiotic    Dispense:  1 tablet    Refill:  0    Return if symptoms worsen or fail to improve.  Tinnie DELENA Harada, NP

## 2023-10-03 NOTE — Patient Instructions (Signed)
 It was great to see you!  Start augmentin  twice a day for 10 days  I have sent in a diflucan  as well if you were to get a yeast infection   Re-start your flonase  daily.   Let's follow-up if your symptoms worsen or don't improve  Take care,  Tinnie Harada, NP

## 2023-10-09 ENCOUNTER — Telehealth: Payer: Self-pay

## 2023-10-09 ENCOUNTER — Other Ambulatory Visit (HOSPITAL_COMMUNITY): Payer: Self-pay

## 2023-10-09 NOTE — Telephone Encounter (Signed)
 Prolia VOB initiated via [email or web address]  Next Prolia inj DUE: 11/05/22

## 2023-10-10 ENCOUNTER — Other Ambulatory Visit (HOSPITAL_COMMUNITY): Payer: Self-pay

## 2023-10-10 NOTE — Telephone Encounter (Signed)
 Lisa Spence

## 2023-10-11 NOTE — Telephone Encounter (Signed)
Pharmacy Patient Advocate Encounter   Received notification from  [facility] Portal that prior authorization for PROLIA is required/requested.   Insurance verification completed.   The patient is insured through [employer] .   Per test claim: PA required; PA submitted to above mentioned insurance via [employer]/confirmation #/EOC  [id number] Status is pending

## 2023-10-12 ENCOUNTER — Other Ambulatory Visit (HOSPITAL_COMMUNITY): Payer: Self-pay

## 2023-10-12 NOTE — Telephone Encounter (Signed)
Pt ready for scheduling for PROLIA on or after : 11/06/23  Out-of-pocket cost due at time of visit: $356  Number of injection/visits approved: 2  Primary: [employer] Prolia co-insurance: 20% Admin fee co-insurance: 20%  Secondary: --- Prolia co-insurance:  Admin fee co-insurance:   Medical Benefit Details: Date Benefits were checked: 10/09/22 Deductible: NO/ Coinsurance: 20%/ Admin Fee: 20%  Prior Auth: APPROVED PA# [id number] Expiration Date: 10/11/23-10/10/24  # of doses approved: 2  Pharmacy benefit: Copay $645.79 If patient wants fill through the pharmacy benefit please send prescription to: [employer], and include estimated need by date in rx notes. Pharmacy will ship medication directly to the office.  Patient NOT eligible for Prolia Copay Card. Copay Card can make patient's cost as little as $25. Link to apply: [email or web address]://www.amgensupportplus.com/copay  ** This summary of benefits is an estimation of the patient's out-of-pocket cost. Exact cost may very based on individual plan coverage.

## 2023-10-17 ENCOUNTER — Other Ambulatory Visit: Payer: Self-pay | Admitting: Nurse Practitioner

## 2023-10-17 DIAGNOSIS — F419 Anxiety disorder, unspecified: Secondary | ICD-10-CM

## 2023-10-17 NOTE — Telephone Encounter (Signed)
Requesting: CLONAZEPAM 0.5MG  TABLETS  Last Visit: 10/03/2023 Next Visit: 12/05/2023 Last Refill: 08/16/2023  Please Advise

## 2023-10-30 ENCOUNTER — Other Ambulatory Visit: Payer: Self-pay | Admitting: Nurse Practitioner

## 2023-11-07 ENCOUNTER — Other Ambulatory Visit: Payer: Self-pay | Admitting: Nurse Practitioner

## 2023-11-07 DIAGNOSIS — Z0000 Encounter for general adult medical examination without abnormal findings: Secondary | ICD-10-CM

## 2023-11-12 ENCOUNTER — Other Ambulatory Visit: Payer: Self-pay | Admitting: Nurse Practitioner

## 2023-11-12 ENCOUNTER — Ambulatory Visit
Admission: RE | Admit: 2023-11-12 | Discharge: 2023-11-12 | Disposition: A | Discharge status: Home / Self Care | Payer: Medicare HMO | Source: Ambulatory Visit | Attending: Nurse Practitioner | Admitting: Nurse Practitioner

## 2023-11-12 ENCOUNTER — Telehealth: Payer: Self-pay

## 2023-11-12 DIAGNOSIS — Z0000 Encounter for general adult medical examination without abnormal findings: Secondary | ICD-10-CM

## 2023-11-12 DIAGNOSIS — M810 Age-related osteoporosis without current pathological fracture: Secondary | ICD-10-CM

## 2023-11-12 DIAGNOSIS — Z1231 Encounter for screening mammogram for malignant neoplasm of breast: Secondary | ICD-10-CM | POA: Diagnosis not present

## 2023-11-12 NOTE — Telephone Encounter (Signed)
 Copied from CRM [id number]. Topic: Clinical - Medical Advice >> Nov 12, 2023 10:32 AM Alcus Dad wrote: Reason for CRM: Patient stated that she would like to speak to [facility] directly about Prolia. She is unsure of what to do or what to choose. Please call patient back  Called and spoke to patient, please run this through her Medical Benefit.  Last time it cost her $356.       Can you let her know when she can come in for an appointment?   Please call her by phone.  Thanks. Dm/cma

## 2023-11-14 NOTE — Telephone Encounter (Signed)
 Email sent to physician services to order Prolia inj for pt to be billed on medical benefit.

## 2023-11-15 ENCOUNTER — Encounter: Payer: Self-pay | Admitting: Nurse Practitioner

## 2023-11-15 NOTE — Telephone Encounter (Signed)
 Prolia arrived to the office. NOT patient-supplied.

## 2023-11-20 NOTE — Telephone Encounter (Signed)
 Patient is needing a prescription sent to [facility] for her loNIDine (CATAPRES) 0.1 MG tablet [[id number]]  medication she would like a call back she needs this asap

## 2023-11-20 NOTE — Telephone Encounter (Signed)
 I called and spoke with patient and notified her that I called [facility] and spoke with pharmacist and she said that medication was on file and will fill it. I verified that it was Clonidine and she said yes and will fill for patient.

## 2023-11-20 NOTE — Telephone Encounter (Signed)
 I called and spoke with pharmacist at [facility] about Rx that was sent on 11/12/23 and they had it on file and will fill it now. I will call patient and let her know.

## 2023-11-21 ENCOUNTER — Ambulatory Visit (INDEPENDENT_AMBULATORY_CARE_PROVIDER_SITE_OTHER): Payer: Medicare HMO

## 2023-11-21 DIAGNOSIS — M810 Age-related osteoporosis without current pathological fracture: Secondary | ICD-10-CM

## 2023-11-21 NOTE — Progress Notes (Signed)
 Lisa Spence is a 73 y.o. female presents to the office today for Prolia injection, per Rodman Pickle NP orders.  Original order: 11/21/2023  60MG /ML SUB Q injection was administered in left arm today. Patient tolerated injection with no swelling or redness at injection site.  Patient due for follow up labs/provider appt: Yes. Date due: 12/05/2023, appt made Yes Patient next injection due: 05/20/2024 (6 months) , appt made No  Javen Ridings L Eris Hannan

## 2023-11-30 ENCOUNTER — Ambulatory Visit: Payer: Medicare HMO | Admitting: Podiatry

## 2023-12-05 ENCOUNTER — Ambulatory Visit (INDEPENDENT_AMBULATORY_CARE_PROVIDER_SITE_OTHER): Payer: Medicare HMO | Admitting: Nurse Practitioner

## 2023-12-05 ENCOUNTER — Encounter: Payer: Self-pay | Admitting: Nurse Practitioner

## 2023-12-05 VITALS — BP 124/78 | HR 77 | Temp 97.1°F | Ht 66.0 in | Wt 176.8 lb

## 2023-12-05 DIAGNOSIS — F419 Anxiety disorder, unspecified: Secondary | ICD-10-CM | POA: Diagnosis not present

## 2023-12-05 DIAGNOSIS — K2100 Gastro-esophageal reflux disease with esophagitis, without bleeding: Secondary | ICD-10-CM | POA: Diagnosis not present

## 2023-12-05 DIAGNOSIS — N951 Menopausal and female climacteric states: Secondary | ICD-10-CM

## 2023-12-05 MED ORDER — VENLAFAXINE HCL ER 150 MG PO CP24: 150.0000 mg | ORAL_CAPSULE | Freq: Every day | ORAL | 0 refills | Status: DC

## 2023-12-05 NOTE — Assessment & Plan Note (Signed)
 Her GERD is managed with pantoprazole and famotidine, with famotidine being more effective. Vivid dreams possibly related to famotidine will be discussed with a gastroenterologist. Continue pantoprazole 40mg  BID and famotidine 20mg  daily, consider adjusting the timing of doses, and discuss vivid dreams with the gastroenterologist.

## 2023-12-05 NOTE — Assessment & Plan Note (Signed)
 Chronic, stable. Venlafaxine has been increased and is providing benefit. Reassessment is planned, taking into account family stressors and potential tapering. Continue venlafaxine 150mg  daily for 90 days and reassess anxiety management in 90 days.

## 2023-12-05 NOTE — Assessment & Plan Note (Signed)
 Chronic, stable. Clonidine has improved her hot flashes, though occasional episodes persist. Future tapering will be considered while monitoring blood pressure. Continue clonidine 0.1mg  daily for 90 days, reassess hot flashes, and consider tapering clonidine in 90 days. Monitor blood pressure.

## 2023-12-05 NOTE — Progress Notes (Signed)
 Established Patient Office Visit  Subjective   Patient ID: Lisa Spence, female    DOB: [birth year]  Age: 72 y.o. MRN: [id number]  Chief Complaint  Patient presents with   Hot flashes due to menopause    Follow up    HPI Discussed the use of AI scribe software for clinical note transcription with the patient, who gave verbal consent to proceed.  History of Present Illness   The patient, with a history of hot flashes, anxiety, and GERD, presents for a follow-up visit. The hot flashes have improved significantly, but she still occurs occasionally. The patient is currently taking clonidine for the hot flashes and would like to continue this medication for another 90 days, with the hope of eventually weaning off it.  The patient's anxiety has also improved with venlafaxine, which she would like to continue for another 90 days. She also mentions taking clonazepam 0.5,mg daily as needed, which she finds helpful. She is having some increase in stress due to family and friends dealing with cancer. She is helping and visiting when she can.   The patient also discusses her GERD symptoms, which have improved but are still present. She is currently taking pantoprazole and famotidine for this condition. She has been following with GI and has an upcoming appointment soon.        ROS See pertinent positives and negatives per HPI.    Objective:     BP 124/78 (BP Location: Right Arm, Cuff Size: Normal)   Pulse 77   Temp (!) 97.1 F (36.2 C)   Ht 5\' 6"  (1.676 m)   Wt 176 lb 12.8 oz (80.2 kg)   SpO2 98%   BMI 28.54 kg/m  BP Readings from Last 3 Encounters:  12/05/23 124/78  10/03/23 122/80  09/21/23 130/78   Wt Readings from Last 3 Encounters:  12/05/23 176 lb 12.8 oz (80.2 kg)  10/03/23 176 lb (79.8 kg)  09/21/23 174 lb (78.9 kg)      Physical Exam Vitals and nursing note reviewed.  Constitutional:      General: She is not in acute distress.    Appearance: Normal  appearance.  HENT:     Head: Normocephalic.  Eyes:     Conjunctiva/sclera: Conjunctivae normal.  Cardiovascular:     Rate and Rhythm: Normal rate and regular rhythm.     Pulses: Normal pulses.     Heart sounds: Normal heart sounds.  Pulmonary:     Effort: Pulmonary effort is normal.     Breath sounds: Normal breath sounds.  Musculoskeletal:     Cervical back: Normal range of motion.  Skin:    General: Skin is warm.  Neurological:     General: No focal deficit present.     Mental Status: She is alert and oriented to person, place, and time.  Psychiatric:        Mood and Affect: Mood normal.        Behavior: Behavior normal.        Thought Content: Thought content normal.        Judgment: Judgment normal.    The 10-year ASCVD risk score (Arnett DK, et al., 2019) is: 11.8%    Assessment & Plan:   Problem List Items Addressed This Visit       Cardiovascular and Mediastinum   Hot flashes due to menopause - Primary   Chronic, stable. Clonidine has improved her hot flashes, though occasional episodes persist. Future tapering will be considered while monitoring  blood pressure. Continue clonidine 0.1mg  daily for 90 days, reassess hot flashes, and consider tapering clonidine in 90 days. Monitor blood pressure.        Digestive   GERD (gastroesophageal reflux disease)   Her GERD is managed with pantoprazole and famotidine, with famotidine being more effective. Vivid dreams possibly related to famotidine will be discussed with a gastroenterologist. Continue pantoprazole 40mg  BID and famotidine 20mg  daily, consider adjusting the timing of doses, and discuss vivid dreams with the gastroenterologist.         Other   Anxiety   Chronic, stable. Venlafaxine has been increased and is providing benefit. Reassessment is planned, taking into account family stressors and potential tapering. Continue venlafaxine 150mg  daily for 90 days and reassess anxiety management in 90 days.       Relevant Medications   venlafaxine XR (EFFEXOR-XR) 150 MG 24 hr capsule   Return in about 3 months (around 03/06/2024) for CPE.    Gerre Scull, NP

## 2023-12-05 NOTE — Patient Instructions (Signed)
 It was great to see you!  I have sent the refill in to your pharmacy   Let's follow-up in 3 months, sooner if you have concerns.  If a referral was placed today, you will be contacted for an appointment. Please note that routine referrals can sometimes take up to 3-4 weeks to process. Please call our office if you haven't heard anything after this time frame.  Take care,  Rodman Pickle, NP

## 2023-12-07 ENCOUNTER — Encounter: Payer: Self-pay | Admitting: Podiatry

## 2023-12-07 ENCOUNTER — Ambulatory Visit: Admitting: Podiatry

## 2023-12-07 DIAGNOSIS — B351 Tinea unguium: Secondary | ICD-10-CM | POA: Diagnosis not present

## 2023-12-07 DIAGNOSIS — M79675 Pain in left toe(s): Secondary | ICD-10-CM

## 2023-12-07 DIAGNOSIS — M79674 Pain in right toe(s): Secondary | ICD-10-CM

## 2023-12-07 NOTE — Progress Notes (Signed)

## 2023-12-07 NOTE — Patient Instructions (Signed)

## 2023-12-08 ENCOUNTER — Other Ambulatory Visit: Payer: Self-pay | Admitting: Nurse Practitioner

## 2023-12-10 NOTE — Telephone Encounter (Signed)
 Requesting: CLONIDINE 0.1MG  TABLETS  Last Visit: 12/05/2023 Next Visit: 03/06/2024 Last Refill: 11/12/2023  Please Advise

## 2023-12-11 ENCOUNTER — Ambulatory Visit: Payer: Medicare HMO | Admitting: Podiatry

## 2023-12-24 NOTE — Telephone Encounter (Signed)
 [facility] Referral placed for pt.  Called to inform pt of the referral, the reason, and to expect a call from their office. Pt expresses understanding.   Pt also states she will call later in the week to schedule an appointment with PCP to be assessed, as she is currently out of town. She believes her allergies are flaring up, but wants to be sure.

## 2023-12-24 NOTE — Addendum Note (Signed)
 Addended by: Larey Dresser on: 12/24/2023 11:14 AM   Modules accepted: Orders

## 2024-01-01 ENCOUNTER — Ambulatory Visit: Admitting: Family Medicine

## 2024-01-01 ENCOUNTER — Encounter: Payer: Self-pay | Admitting: Family Medicine

## 2024-01-01 VITALS — BP 144/88 | HR 90 | Temp 97.6°F | Wt 176.2 lb

## 2024-01-01 DIAGNOSIS — J069 Acute upper respiratory infection, unspecified: Secondary | ICD-10-CM

## 2024-01-01 LAB — POCT INFLUENZA A/B
Influenza A, POC: NEGATIVE
Influenza B, POC: NEGATIVE

## 2024-01-01 LAB — POC COVID19 BINAXNOW: SARS Coronavirus 2 Ag: NEGATIVE

## 2024-01-01 MED ORDER — PROMETHAZINE-DM 6.25-15 MG/5ML PO SYRP: 5.0000 mL | ORAL_SOLUTION | Freq: Four times a day (QID) | ORAL | 0 refills | Status: DC | PRN

## 2024-01-01 MED ORDER — FLUTICASONE PROPIONATE 50 MCG/ACT NA SUSP: 2.0000 | Freq: Every day | NASAL | 6 refills | Status: DC

## 2024-01-01 MED ORDER — LORATADINE 10 MG PO TABS: 10.0000 mg | ORAL_TABLET | Freq: Every day | ORAL | 11 refills | Status: AC

## 2024-01-01 NOTE — Progress Notes (Signed)
 Assessment/Plan:    Assessment & Plan Persistent Cough Persistent cough for ten days, initially with congestion and sore throat. Negative for COVID-19 and influenza. No fever, clear lungs, and stable vital signs. Likely viral infection with post-viral cough. Emotional stress due to family illnesses may contribute to perceived severity. - Prescribe promethazine DM syrup for cough management due to its effectiveness and lower addiction risk compared to codeine syrups. - Recommend Flonase nasal spray for drainage. - Advise loratadine for antihistamine effect. - Suggest guaifenesin for mucus clearance. - Reassess if symptoms worsen or do not improve.      There are no discontinued medications.  Return if symptoms worsen or fail to improve.    Subjective:   Encounter date: 01/01/2024  Lisa Spence is a 73 y.o. female who has Left trimalleolar fracture, closed, initial encounter; Anxiety; GERD (gastroesophageal reflux disease); Constipation; Vaginal dryness, menopausal; Hot flashes due to menopause; Osteoporosis; Bursitis of right hip; Elevated LFTs; Hyperlipidemia; Belching; Elevated blood pressure reading; Weak urinary stream; Primary osteoarthritis of both hands; Heartburn; Routine general medical examination at a health care facility; Localized swelling of right lower leg; COVID-19; and Shortness of breath on their problem list..   She  has a past medical history of Allergy, Anxiety, Fatty liver, GERD (gastroesophageal reflux disease), Internal hemorrhoids, and Osteoporosis..   She presents with chief complaint of Sore Throat (Sore throat, sneezing, coughing , right earx 10 days) .   Discussed the use of AI scribe software for clinical note transcription with the patient, who gave verbal consent to proceed.  History of Present Illness Lisa Spence is a 73 year old female who presents with persistent cough and congestion.  She has been experiencing persistent coughing and  congestion for the past ten days. The cough is continuous, occurring both day and night, and is described as deep with a sensation in her chest. She experiences shortness of breath when coughing and describes her throat as irritated, with post-nasal drip. No fever, but her right eye waters and she has ear discomfort.  The symptoms began after visiting her godchildren in [place], [place], where she was exposed to blooming allergens. She used over-the-counter allergy medications and nasal spray while in [place], which she believes helped to some extent. She mentions using a nasal spray once daily and an allergy pill, possibly loratadine, which she has used in the past with Flonase.  She recalls a history of similar symptoms in December, which included a bad sore throat that has since resolved. During that episode, she was very ill, requiring steroids and antibiotics, which she feels did not help significantly. She tested negative for COVID-19 and flu at that time, and no infection was found in her ear.  She is concerned about her symptoms due to her close contact with family members who have cancer and her desire to avoid spreading illness. She has two sisters-in-law with cancer, one with glioblastoma.  Her current medications include an albuterol inhaler and over-the-counter allergy medications. She has previously used Flonase and loratadine, which she finds helpful.     ROS  Past Surgical History:  Procedure Laterality Date   ABDOMINAL HYSTERECTOMY     COLONOSCOPY     ESOPHAGEAL MANOMETRY N/A 12/20/2022   Procedure: ESOPHAGEAL MANOMETRY (EM);  Surgeon: Napoleon Form, MD;  Location: [facility];  Service: Gastroenterology;  Laterality: N/A;   ORIF ANKLE FRACTURE Left 07/09/2017   Procedure: OPEN REDUCTION INTERNAL FIXATION (ORIF) ANKLE FRACTURE;  Surgeon: Yolonda Kida, MD;  Location: [facility] OR;  Service: Orthopedics;  Laterality: Left;  90 mins    Outpatient Medications Prior  to Visit  Medication Sig Dispense Refill   albuterol (VENTOLIN HFA) 108 (90 Base) MCG/ACT inhaler INHALE 2 PUFFS INTO THE LUNGS EVERY 6 HOURS AS NEEDED FOR WHEEZING OR SHORTNESS OF BREATH 6.7 g 2   calcium carbonate (OS-CAL) 1250 (500 Ca) MG chewable tablet Chew 1 tablet by mouth daily.     cholecalciferol (VITAMIN D3) 25 MCG (1000 UNIT) tablet Take 1,000 Units by mouth daily.     clonazePAM (KLONOPIN) 0.5 MG tablet TAKE 1 TABLET(0.5 MG) BY MOUTH TWICE DAILY AS NEEDED FOR ANXIETY 60 tablet 0   cloNIDine (CATAPRES) 0.1 MG tablet TAKE 1 TABLET(0.1 MG) BY MOUTH DAILY 90 tablet 1   Efinaconazole 10 % SOLN Apply 1 application  topically daily. 8 mL 2   famotidine (PEPCID) 20 MG tablet Take 1 tablet (20 mg total) by mouth at bedtime. 90 tablet 3   hydrocortisone 2.5 % cream Apply topically daily.     ketoconazole (NIZORAL) 2 % cream Apply topically daily.     Misc Natural Products (FOCUSED MIND PO) Take by mouth. Daily vitamins     Multiple Vitamins-Minerals (ONE A DAY WOMEN 50 PLUS PO) Take 1 tablet by mouth daily.     nystatin cream (MYCOSTATIN) Apply topically 2 (two) times daily.     Omega-3 Fatty Acids (FISH OIL OMEGA-3 PO) Take 1 capsule by mouth daily.     pantoprazole (PROTONIX) 40 MG tablet Take 1 tablet (40 mg total) by mouth 2 (two) times daily. 180 tablet 3   polyethylene glycol (MIRALAX / GLYCOLAX) 17 g packet Take 17 g by mouth as needed. 14 each 3   PREMARIN vaginal cream INSERT 1 APPLICATORFUL VAGINALLY 2 TIMES A WEEK 30 g 5   venlafaxine XR (EFFEXOR-XR) 150 MG 24 hr capsule Take 1 capsule (150 mg total) by mouth daily with breakfast. 90 capsule 0   No facility-administered medications prior to visit.    Family History  Problem Relation Age of Onset   Heart disease Mother    Heart disease Father    Heart disease Maternal Grandmother    Heart disease Maternal Grandfather    Heart disease Paternal Grandmother    Heart disease Paternal Grandfather    Colon cancer Neg Hx     Esophageal cancer Neg Hx    Rectal cancer Neg Hx    Stomach cancer Neg Hx     Social History   Socioeconomic History   Marital status: Single    Spouse name: Not on file   Number of children: Not on file   Years of education: Not on file   Highest education level: Bachelor's degree (e.g., BA, AB, BS)  Occupational History   Not on file  Tobacco Use   Smoking status: Never   Smokeless tobacco: Never  Vaping Use   Vaping status: Never Used  Substance and Sexual Activity   Alcohol use: No    Comment: no longer drinks, drank 6 glasses wine/week stopped in 2007   Drug use: No   Sexual activity: Not Currently  Other Topics Concern   Not on file  Social History Narrative   Not on file   Social Drivers of Health   Financial Resource Strain: Low Risk  (08/16/2023)   Overall Financial Resource Strain (CARDIA)    Difficulty of Paying Living Expenses: Not hard at all  Food Insecurity: No Food Insecurity (08/16/2023)   Hunger  Vital Sign    Worried About Programme researcher, broadcasting/film/video in the Last Year: Never true    Ran Out of Food in the Last Year: Never true  Transportation Needs: No Transportation Needs (08/16/2023)   PRAPARE - Administrator, Civil Service (Medical): No    Lack of Transportation (Non-Medical): No  Physical Activity: Sufficiently Active (08/16/2023)   Exercise Vital Sign    Days of Exercise per Week: 7 days    Minutes of Exercise per Session: 50 min  Stress: Stress Concern Present (08/16/2023)   [employer] of Occupational Health - Occupational Stress Questionnaire    Feeling of Stress : To some extent  Social Connections: Moderately Integrated (08/16/2023)   Social Connection and Isolation Panel [NHANES]    Frequency of Communication with Friends and Family: More than three times a week    Frequency of Social Gatherings with Friends and Family: Three times a week    Attends Religious Services: More than 4 times per year    Active Member of Clubs or  Organizations: Yes    Attends Banker Meetings: More than 4 times per year    Marital Status: Never married  Intimate Partner Violence: Not At Risk (06/25/2023)   Humiliation, Afraid, Rape, and Kick questionnaire    Fear of Current or Ex-Partner: No    Emotionally Abused: No    Physically Abused: No    Sexually Abused: No                                                                                                  Objective:  Physical Exam: BP (!) 144/88   Pulse 90   Temp 97.6 F (36.4 C) (Temporal)   Wt 176 lb 3.2 oz (79.9 kg)   SpO2 98%   BMI 28.44 kg/m    Physical Exam GENERAL: Alert, cooperative, well developed, no acute distress. HEENT: Normocephalic, normal oropharynx, sinuses nontender, moist mucous membranes, no fluid or cerumen in ears bilaterally, clear pharynx without enlarged tonsils or lesions. CHEST: Clear to auscultation bilaterally, no wheezes, rhonchi, or crackles. CARDIOVASCULAR: Normal heart rate and rhythm, S1 and S2 normal without murmurs. ABDOMEN: Soft, non-tender, non-distended, without organomegaly, normal bowel sounds. EXTREMITIES: No cyanosis or edema. NEUROLOGICAL: Cranial nerves grossly intact, moves all extremities without gross motor or sensory deficit.    MM 3D SCREENING MAMMOGRAM BILATERAL BREAST Result Date: 11/15/2023 CLINICAL DATA:  Screening. EXAM: DIGITAL SCREENING BILATERAL MAMMOGRAM WITH TOMOSYNTHESIS AND CAD TECHNIQUE: Bilateral screening digital craniocaudal and mediolateral oblique mammograms were obtained. Bilateral screening digital breast tomosynthesis was performed. The images were evaluated with computer-aided detection. COMPARISON:  None available. ACR Breast Density Category b: There are scattered areas of fibroglandular density. FINDINGS: There are no findings suspicious for malignancy. IMPRESSION: No mammographic evidence of malignancy. A result letter of this screening mammogram will be mailed directly to the  patient. RECOMMENDATION: Screening mammogram in one year. (Code:SM-B-01Y) BI-RADS CATEGORY  1: Negative. Electronically Signed   By: Edwin Cap M.D.   On: 11/15/2023 10:10    Recent Results (from the past 2160 hours)  POC COVID-19 [facility]     Status: None   Collection Time: 01/01/24  4:06 PM  Result Value Ref Range   SARS Coronavirus 2 Ag Negative Negative  POCT Influenza A/B     Status: None   Collection Time: 01/01/24  4:06 PM  Result Value Ref Range   Influenza A, POC Negative Negative   Influenza B, POC Negative Negative        Garner Nash, MD, MS

## 2024-01-01 NOTE — Patient Instructions (Signed)
 VISIT SUMMARY:  You came in today because you have been experiencing a persistent cough and congestion for the past ten days. You mentioned that the cough is deep and continuous, and you have shortness of breath when coughing. You also have post-nasal drip, an irritated throat, a watery right eye, and ear discomfort. You were exposed to blooming allergens while visiting your godchildren in [place], [place], and have been using over-the-counter allergy medications and nasal spray, which you believe helped to some extent. You are concerned about your symptoms due to your close contact with family members who have cancer.  YOUR PLAN:  -PERSISTENT COUGH: A persistent cough lasting for ten days, likely due to a viral infection with post-viral cough. To manage your symptoms, I have prescribed promethazine DM syrup for your cough, which is effective and has a lower risk of addiction compared to codeine syrups. I also recommend using Flonase nasal spray for drainage, taking loratadine for its antihistamine effect, and using guaifenesin to help clear mucus. Please reassess if your symptoms worsen or do not improve.  INSTRUCTIONS:  Please follow the prescribed treatment plan and reassess if your symptoms worsen or do not improve. If you have any concerns or if your symptoms persist, schedule a follow-up appointment.Please be sure to drink plenty of fluids.   You may take the following OTC medications to help with symptoms:  For cough, use  cough syrups or other cough suppressants.  For headache, sore throat, fevers, muscle aches, chills, other pain, take ibuprofen or tylenol  For congestion, use nasal sprays, decongestants, or antihistamines  Please follow up if no improvement.   Go to ED if you have severe chest pain, fevers, shortness of breath or other worrisome symptoms.

## 2024-01-10 DIAGNOSIS — L814 Other melanin hyperpigmentation: Secondary | ICD-10-CM | POA: Diagnosis not present

## 2024-01-10 DIAGNOSIS — L659 Nonscarring hair loss, unspecified: Secondary | ICD-10-CM | POA: Diagnosis not present

## 2024-01-10 DIAGNOSIS — D1801 Hemangioma of skin and subcutaneous tissue: Secondary | ICD-10-CM | POA: Diagnosis not present

## 2024-01-10 DIAGNOSIS — L719 Rosacea, unspecified: Secondary | ICD-10-CM | POA: Diagnosis not present

## 2024-01-10 DIAGNOSIS — D229 Melanocytic nevi, unspecified: Secondary | ICD-10-CM | POA: Diagnosis not present

## 2024-01-10 DIAGNOSIS — L821 Other seborrheic keratosis: Secondary | ICD-10-CM | POA: Diagnosis not present

## 2024-01-10 DIAGNOSIS — L578 Other skin changes due to chronic exposure to nonionizing radiation: Secondary | ICD-10-CM | POA: Diagnosis not present

## 2024-01-10 DIAGNOSIS — B359 Dermatophytosis, unspecified: Secondary | ICD-10-CM | POA: Diagnosis not present

## 2024-01-15 ENCOUNTER — Other Ambulatory Visit: Payer: Self-pay | Admitting: Family Medicine

## 2024-01-15 ENCOUNTER — Other Ambulatory Visit: Payer: Self-pay | Admitting: Nurse Practitioner

## 2024-01-15 DIAGNOSIS — F419 Anxiety disorder, unspecified: Secondary | ICD-10-CM

## 2024-01-15 DIAGNOSIS — J069 Acute upper respiratory infection, unspecified: Secondary | ICD-10-CM

## 2024-01-15 NOTE — Telephone Encounter (Signed)
 Requesting: CLONAZEPAM  0.5MG  TABLETS  Last Visit: 12/05/2023 Next Visit: 03/06/2024 Last Refill: 10/17/2023  Please Advise

## 2024-01-16 ENCOUNTER — Other Ambulatory Visit: Payer: Self-pay | Admitting: Nurse Practitioner

## 2024-01-16 DIAGNOSIS — J069 Acute upper respiratory infection, unspecified: Secondary | ICD-10-CM

## 2024-01-16 MED ORDER — PROMETHAZINE-DM 6.25-15 MG/5ML PO SYRP: 5.0000 mL | ORAL_SOLUTION | Freq: Four times a day (QID) | ORAL | 0 refills | Status: DC | PRN

## 2024-01-17 NOTE — Telephone Encounter (Signed)
 Requesting: PROMETHAZINE  DM SYRUP  Last Visit: 01/01/2024 Next Visit: Visit date not found Last Refill: 01/16/2024  Please Advise

## 2024-01-17 NOTE — Telephone Encounter (Signed)
 Pt was called and told the med was refilled and she could pick it up.

## 2024-02-11 ENCOUNTER — Ambulatory Visit: Admitting: Physician Assistant

## 2024-03-04 ENCOUNTER — Other Ambulatory Visit: Payer: Self-pay | Admitting: Nurse Practitioner

## 2024-03-05 ENCOUNTER — Encounter: Payer: Self-pay | Admitting: Physician Assistant

## 2024-03-05 ENCOUNTER — Ambulatory Visit (INDEPENDENT_AMBULATORY_CARE_PROVIDER_SITE_OTHER): Admitting: Physician Assistant

## 2024-03-05 ENCOUNTER — Telehealth: Payer: Self-pay

## 2024-03-05 ENCOUNTER — Ambulatory Visit: Admitting: Physician Assistant

## 2024-03-05 DIAGNOSIS — M810 Age-related osteoporosis without current pathological fracture: Secondary | ICD-10-CM

## 2024-03-05 NOTE — Progress Notes (Signed)
 Office Visit Note   Patient: Lisa Spence           Date of Birth: 1950/10/16           MRN: [id number] Visit Date: 03/05/2024              Requested by: Odette Benjamin, NP [address] [place],  [place] [address] PCP: Odette Benjamin, NP   Assessment & Plan: Visit Diagnoses:  1. Age-related osteoporosis without current pathological fracture     Plan: Patient is a Lisa Spence 73 year old with a history of osteoporosis.  With T-scores of -2.8.  She has been approved for and has been receiving her Prolia  injections through her primary care.  I think this is an appropriate medication for her.  She is already approved to get it through primary care and she continue to do so.  May follow-up with us  as needed  Follow-Up Instructions: No follow-ups on file.   Orders:  No orders of the defined types were placed in this encounter.  No orders of the defined types were placed in this encounter.     Procedures: No procedures performed   Clinical Data: No additional findings.   Subjective: No chief complaint on file.   HPI Lisa Spence 73 year old woman who comes in today.  She has a history of osteoporosis is seen by [facility].  They have been giving her her Prolia  injections she had 1 in January is due for 1 in July.  She said she was referred here because they had difficulty getting the Prolia  she has been already preapproved for Prolia  at their office Review of Systems  All other systems reviewed and are negative.    Objective: Vital Signs: There were no vitals taken for this visit.  Physical Exam Constitutional:      Appearance: Normal appearance.  Pulmonary:     Effort: Pulmonary effort is normal.  Skin:    General: Skin is warm and dry.  Neurological:     General: No focal deficit present.     Mental Status: She is alert and oriented to person, place, and time.  Psychiatric:        Mood and Affect: Mood normal.        Behavior: Behavior  normal.     Ortho Exam  Specialty Comments:  No specialty comments available.  Imaging: No results found.   PMFS History: Patient Active Problem List   Diagnosis Date Noted   Shortness of breath 06/18/2023   COVID-19 04/24/2023   Localized swelling of right lower leg 02/20/2023   Routine general medical examination at a health care facility 02/13/2023   Heartburn 12/21/2022   Primary osteoarthritis of both hands 12/20/2022   Weak urinary stream 10/24/2022   Elevated blood pressure reading 05/02/2022   Belching 01/04/2022   Hyperlipidemia 12/06/2021   Elevated LFTs 11/08/2021   Bursitis of right hip 05/27/2021   Age-related osteoporosis without current pathological fracture 03/10/2020   Hot flashes due to menopause 05/02/2019   Anxiety 04/25/2019   GERD (gastroesophageal reflux disease) 04/25/2019   Constipation 04/25/2019   Vaginal dryness, menopausal 04/25/2019   Left trimalleolar fracture, closed, initial encounter 07/09/2017   Past Medical History:  Diagnosis Date   Allergy    Anxiety    Fatty liver    GERD (gastroesophageal reflux disease)    Internal hemorrhoids    Osteoporosis     Family History  Problem Relation Age of Onset   Heart disease Mother  Heart disease Father    Heart disease Maternal Grandmother    Heart disease Maternal Grandfather    Heart disease Paternal Grandmother    Heart disease Paternal Grandfather    Colon cancer Neg Hx    Esophageal cancer Neg Hx    Rectal cancer Neg Hx    Stomach cancer Neg Hx     Past Surgical History:  Procedure Laterality Date   ABDOMINAL HYSTERECTOMY     COLONOSCOPY     ESOPHAGEAL MANOMETRY N/A 12/20/2022   Procedure: ESOPHAGEAL MANOMETRY (EM);  Surgeon: Sergio Dandy, MD;  Location: [facility];  Service: Gastroenterology;  Laterality: N/A;   ORIF ANKLE FRACTURE Left 07/09/2017   Procedure: OPEN REDUCTION INTERNAL FIXATION (ORIF) ANKLE FRACTURE;  Surgeon: Janeth Medicus, MD;  Location:  [facility];  Service: Orthopedics;  Laterality: Left;  90 mins   Social History   Occupational History   Not on file  Tobacco Use   Smoking status: Never   Smokeless tobacco: Never  Vaping Use   Vaping status: Never Used  Substance and Sexual Activity   Alcohol use: No    Comment: no longer drinks, drank 6 glasses wine/week stopped in 2007   Drug use: No   Sexual activity: Not Currently

## 2024-03-05 NOTE — Telephone Encounter (Signed)
 Requesting: VENLAFAXINE  ER 150MG  CAPSULES  Last Visit: 12/05/2023 Next Visit: 03/06/2024 Last Refill: 12/05/2023  Please Advise

## 2024-03-06 ENCOUNTER — Encounter: Payer: Self-pay | Admitting: Nurse Practitioner

## 2024-03-06 ENCOUNTER — Ambulatory Visit (INDEPENDENT_AMBULATORY_CARE_PROVIDER_SITE_OTHER): Admitting: Nurse Practitioner

## 2024-03-06 VITALS — BP 124/82 | HR 88 | Temp 97.0°F | Ht 66.0 in | Wt 175.8 lb

## 2024-03-06 DIAGNOSIS — K2100 Gastro-esophageal reflux disease with esophagitis, without bleeding: Secondary | ICD-10-CM | POA: Diagnosis not present

## 2024-03-06 DIAGNOSIS — R7989 Other specified abnormal findings of blood chemistry: Secondary | ICD-10-CM | POA: Diagnosis not present

## 2024-03-06 DIAGNOSIS — M810 Age-related osteoporosis without current pathological fracture: Secondary | ICD-10-CM | POA: Diagnosis not present

## 2024-03-06 DIAGNOSIS — F419 Anxiety disorder, unspecified: Secondary | ICD-10-CM

## 2024-03-06 DIAGNOSIS — E7800 Pure hypercholesterolemia, unspecified: Secondary | ICD-10-CM

## 2024-03-06 DIAGNOSIS — N951 Menopausal and female climacteric states: Secondary | ICD-10-CM | POA: Diagnosis not present

## 2024-03-06 DIAGNOSIS — Z0000 Encounter for general adult medical examination without abnormal findings: Secondary | ICD-10-CM | POA: Diagnosis not present

## 2024-03-06 LAB — CBC WITH DIFFERENTIAL/PLATELET
Basophils Absolute: 0.1 10*3/uL (ref 0.0–0.1)
Basophils Relative: 0.8 % (ref 0.0–3.0)
Eosinophils Absolute: 0.3 10*3/uL (ref 0.0–0.7)
Eosinophils Relative: 3.6 % (ref 0.0–5.0)
HCT: 43.4 % (ref 36.0–46.0)
Hemoglobin: 14.3 g/dL (ref 12.0–15.0)
Lymphocytes Relative: 34.4 % (ref 12.0–46.0)
Lymphs Abs: 2.8 10*3/uL (ref 0.7–4.0)
MCHC: 32.9 g/dL (ref 30.0–36.0)
MCV: 88.4 fl (ref 78.0–100.0)
Monocytes Absolute: 0.6 10*3/uL (ref 0.1–1.0)
Monocytes Relative: 7 % (ref 3.0–12.0)
Neutro Abs: 4.4 10*3/uL (ref 1.4–7.7)
Neutrophils Relative %: 54.2 % (ref 43.0–77.0)
Platelets: 244 10*3/uL (ref 150.0–400.0)
RBC: 4.92 Mil/uL (ref 3.87–5.11)
RDW: 13.4 % (ref 11.5–15.5)
WBC: 8.1 10*3/uL (ref 4.0–10.5)

## 2024-03-06 LAB — COMPREHENSIVE METABOLIC PANEL WITH GFR
ALT: 64 U/L — ABNORMAL HIGH (ref 0–35)
AST: 42 U/L — ABNORMAL HIGH (ref 0–37)
Albumin: 4.7 g/dL (ref 3.5–5.2)
Alkaline Phosphatase: 57 U/L (ref 39–117)
BUN: 16 mg/dL (ref 6–23)
CO2: 25 meq/L (ref 19–32)
Calcium: 9.5 mg/dL (ref 8.4–10.5)
Chloride: 103 meq/L (ref 96–112)
Creatinine, Ser: 0.75 mg/dL (ref 0.40–1.20)
GFR: 80.21 mL/min (ref >60.00)
Glucose, Bld: 121 mg/dL — ABNORMAL HIGH (ref 70–99)
Potassium: 3.8 meq/L (ref 3.5–5.1)
Sodium: 138 meq/L (ref 135–145)
Total Bilirubin: 0.3 mg/dL (ref 0.2–1.2)
Total Protein: 7 g/dL (ref 6.0–8.3)

## 2024-03-06 LAB — LIPID PANEL
Cholesterol: 187 mg/dL (ref 0–200)
HDL: 44.5 mg/dL (ref >39.00)
LDL Cholesterol: 103 mg/dL — ABNORMAL HIGH (ref 0–99)
NonHDL: 142.73
Total CHOL/HDL Ratio: 4
Triglycerides: 199 mg/dL — ABNORMAL HIGH (ref 0.0–149.0)
VLDL: 39.8 mg/dL (ref 0.0–40.0)

## 2024-03-06 MED ORDER — CLONAZEPAM 0.5 MG PO TABS: 0.5000 mg | ORAL_TABLET | Freq: Two times a day (BID) | ORAL | 0 refills | Status: DC | PRN

## 2024-03-06 MED ORDER — CLONIDINE HCL 0.1 MG PO TABS: 0.1000 mg | ORAL_TABLET | Freq: Every day | ORAL | 1 refills | Status: DC

## 2024-03-06 NOTE — Assessment & Plan Note (Signed)
 Updated DEXA scan on 09/19/23 showed a T-score of -2.8.  Continue prolia  injections, calcium, vitamin D, and exercise. There has been some confusion on to whether she is getting prolia  injections at our office vs osteoporosis clinic. Will reach out when we get an answer.

## 2024-03-06 NOTE — Progress Notes (Signed)
 BP 124/82 (BP Location: Right Arm, Cuff Size: Normal)   Pulse 88   Temp (!) 97 F (36.1 C)   Ht 5' 6 (1.676 m)   Wt 175 lb 12.8 oz (79.7 kg)   SpO2 100%   BMI 28.37 kg/m    Subjective:    Patient ID: Lisa Spence, female    DOB: Aug 02, 1951, 73 y.o.   MRN: [id number]  CC: Chief Complaint  Patient presents with   Annual Exam    Patient did not fast for labs, concerns with getting Prolia     HPI: Lisa Spence is a 73 y.o. female presenting on 03/06/2024 for comprehensive medical examination. Current medical complaints include:none  She currently lives with: alone Menopausal Symptoms: yes - hot flashes   Depression and Anxiety Screen done today and results listed below:     03/06/2024    8:43 AM 06/25/2023    8:50 AM 06/18/2023    8:51 AM 02/13/2023    8:33 AM 06/20/2022    9:11 AM  Depression screen PHQ 2/9  Decreased Interest 0 0 0 0 1  Down, Depressed, Hopeless 0 0 0 0 1  PHQ - 2 Score 0 0 0 0 2  Altered sleeping 1  1 1    Tired, decreased energy 0  0 0   Change in appetite 1  0 0   Feeling bad or failure about yourself  0  0 0   Trouble concentrating 0  0 0   Moving slowly or fidgety/restless 0  0 0   Suicidal thoughts 0  0 0   PHQ-9 Score 2  1 1    Difficult doing work/chores Not difficult at all  Not difficult at all Not difficult at all       03/06/2024    8:44 AM 06/18/2023    8:51 AM 02/13/2023    8:34 AM 12/06/2021    9:22 AM  GAD 7 : Generalized Anxiety Score  Nervous, Anxious, on Edge 0 1 1 2   Control/stop worrying 0 0 0 1  Worry too much - different things 1 0 0 0  Trouble relaxing 0 0 0 1  Restless 0 0 0 1  Easily annoyed or irritable 0 1 1 1   Afraid - awful might happen 0 0 0 0  Total GAD 7 Score 1 2 2 6   Anxiety Difficulty Not difficult at all Not difficult at all Somewhat difficult     The patient does not have a history of falls. I did not complete a risk assessment for falls. A plan of care for falls was not documented.   Past Medical  History:  Past Medical History:  Diagnosis Date   Allergy    Anxiety    Fatty liver    GERD (gastroesophageal reflux disease)    Internal hemorrhoids    Osteoporosis     Surgical History:  Past Surgical History:  Procedure Laterality Date   ABDOMINAL HYSTERECTOMY     COLONOSCOPY     ESOPHAGEAL MANOMETRY N/A 12/20/2022   Procedure: ESOPHAGEAL MANOMETRY (EM);  Surgeon: Sergio Dandy, MD;  Location: [facility];  Service: Gastroenterology;  Laterality: N/A;   ORIF ANKLE FRACTURE Left 07/09/2017   Procedure: OPEN REDUCTION INTERNAL FIXATION (ORIF) ANKLE FRACTURE;  Surgeon: Janeth Medicus, MD;  Location: [facility] OR;  Service: Orthopedics;  Laterality: Left;  90 mins    Medications:  Current Outpatient Medications on File Prior to Visit  Medication Sig   albuterol  (VENTOLIN  HFA) 108 (  90 Base) MCG/ACT inhaler INHALE 2 PUFFS INTO THE LUNGS EVERY 6 HOURS AS NEEDED FOR WHEEZING OR SHORTNESS OF BREATH   calcium carbonate (OS-CAL) 1250 (500 Ca) MG chewable tablet Chew 1 tablet by mouth daily.   cholecalciferol (VITAMIN D3) 25 MCG (1000 UNIT) tablet Take 1,000 Units by mouth daily.   Efinaconazole  10 % SOLN Apply 1 application  topically daily.   famotidine  (PEPCID ) 20 MG tablet Take 1 tablet (20 mg total) by mouth at bedtime.   fluticasone  (FLONASE ) 50 MCG/ACT nasal spray Place 2 sprays into both nostrils daily.   hydrocortisone 2.5 % cream Apply topically daily.   ketoconazole (NIZORAL) 2 % cream Apply topically daily.   loratadine  (CLARITIN ) 10 MG tablet Take 1 tablet (10 mg total) by mouth daily.   Misc Natural Products (FOCUSED MIND PO) Take by mouth. Daily vitamins   Multiple Vitamins-Minerals (ONE A DAY WOMEN 50 PLUS PO) Take 1 tablet by mouth daily.   nystatin cream (MYCOSTATIN) Apply topically 2 (two) times daily.   Omega-3 Fatty Acids (FISH OIL OMEGA-3 PO) Take 1 capsule by mouth daily.   pantoprazole  (PROTONIX ) 40 MG tablet Take 1 tablet (40 mg total) by mouth 2 (two)  times daily.   polyethylene glycol (MIRALAX  / GLYCOLAX ) 17 g packet Take 17 g by mouth as needed.   PREMARIN  vaginal cream INSERT 1 APPLICATORFUL VAGINALLY 2 TIMES A WEEK   venlafaxine  XR (EFFEXOR -XR) 150 MG 24 hr capsule TAKE 1 CAPSULE(150 MG) BY MOUTH DAILY WITH BREAKFAST   No current facility-administered medications on file prior to visit.    Allergies:  No Known Allergies  Social History:  Social History   Socioeconomic History   Marital status: Single    Spouse name: Not on file   Number of children: Not on file   Years of education: Not on file   Highest education level: Bachelor's degree (e.g., BA, AB, BS)  Occupational History   Not on file  Tobacco Use   Smoking status: Never   Smokeless tobacco: Never  Vaping Use   Vaping status: Never Used  Substance and Sexual Activity   Alcohol use: No    Comment: no longer drinks, drank 6 glasses wine/week stopped in 2007   Drug use: No   Sexual activity: Not Currently  Other Topics Concern   Not on file  Social History Narrative   Not on file   Social Drivers of Health   Financial Resource Strain: Low Risk  (08/16/2023)   Overall Financial Resource Strain (CARDIA)    Difficulty of Paying Living Expenses: Not hard at all  Food Insecurity: No Food Insecurity (08/16/2023)   Hunger Vital Sign    Worried About Running Out of Food in the Last Year: Never true    Ran Out of Food in the Last Year: Never true  Transportation Needs: No Transportation Needs (08/16/2023)   PRAPARE - Administrator, Civil Service (Medical): No    Lack of Transportation (Non-Medical): No  Physical Activity: Sufficiently Active (08/16/2023)   Exercise Vital Sign    Days of Exercise per Week: 7 days    Minutes of Exercise per Session: 50 min  Stress: Stress Concern Present (08/16/2023)   [employer] of Occupational Health - Occupational Stress Questionnaire    Feeling of Stress : To some extent  Social Connections: Moderately  Integrated (08/16/2023)   Social Connection and Isolation Panel    Frequency of Communication with Friends and Family: More than three times a week  Frequency of Social Gatherings with Friends and Family: Three times a week    Attends Religious Services: More than 4 times per year    Active Member of Clubs or Organizations: Yes    Attends Banker Meetings: More than 4 times per year    Marital Status: Never married  Intimate Partner Violence: Not At Risk (06/25/2023)   Humiliation, Afraid, Rape, and Kick questionnaire    Fear of Current or Ex-Partner: No    Emotionally Abused: No    Physically Abused: No    Sexually Abused: No   Social History   Tobacco Use  Smoking Status Never  Smokeless Tobacco Never   Social History   Substance and Sexual Activity  Alcohol Use No   Comment: no longer drinks, drank 6 glasses wine/week stopped in 2007    Family History:  Family History  Problem Relation Age of Onset   Heart disease Mother    Heart disease Father    Heart disease Maternal Grandmother    Heart disease Maternal Grandfather    Heart disease Paternal Grandmother    Heart disease Paternal Grandfather    Colon cancer Neg Hx    Esophageal cancer Neg Hx    Rectal cancer Neg Hx    Stomach cancer Neg Hx     Past medical history, surgical history, medications, allergies, family history and social history reviewed with patient today and changes made to appropriate areas of the chart.   Review of Systems  Constitutional:  Positive for malaise/fatigue. Negative for fever.       Hot flashes  HENT: Negative.    Eyes: Negative.   Respiratory: Negative.    Cardiovascular: Negative.   Gastrointestinal: Negative.   Genitourinary: Negative.   Musculoskeletal: Negative.   Skin: Negative.   Neurological: Negative.   Psychiatric/Behavioral: Negative.     All other ROS negative except what is listed above and in the HPI.      Objective:    BP 124/82 (BP Location:  Right Arm, Cuff Size: Normal)   Pulse 88   Temp (!) 97 F (36.1 C)   Ht 5' 6 (1.676 m)   Wt 175 lb 12.8 oz (79.7 kg)   SpO2 100%   BMI 28.37 kg/m   Wt Readings from Last 3 Encounters:  03/06/24 175 lb 12.8 oz (79.7 kg)  01/01/24 176 lb 3.2 oz (79.9 kg)  12/05/23 176 lb 12.8 oz (80.2 kg)    Physical Exam Vitals and nursing note reviewed.  Constitutional:      General: She is not in acute distress.    Appearance: Normal appearance.  HENT:     Head: Normocephalic and atraumatic.     Right Ear: Tympanic membrane, ear canal and external ear normal.     Left Ear: Tympanic membrane, ear canal and external ear normal.     Mouth/Throat:     Mouth: Mucous membranes are moist.     Pharynx: No posterior oropharyngeal erythema.   Eyes:     Conjunctiva/sclera: Conjunctivae normal.    Cardiovascular:     Rate and Rhythm: Normal rate and regular rhythm.     Pulses: Normal pulses.     Heart sounds: Normal heart sounds.  Pulmonary:     Effort: Pulmonary effort is normal.     Breath sounds: Normal breath sounds.  Abdominal:     Palpations: Abdomen is soft.     Tenderness: There is no abdominal tenderness.   Musculoskeletal:  General: Normal range of motion.     Cervical back: Normal range of motion and neck supple.     Right lower leg: No edema.     Left lower leg: No edema.  Lymphadenopathy:     Cervical: No cervical adenopathy.   Skin:    General: Skin is warm and dry.   Neurological:     General: No focal deficit present.     Mental Status: She is alert and oriented to person, place, and time.     Cranial Nerves: No cranial nerve deficit.     Coordination: Coordination normal.     Gait: Gait normal.   Psychiatric:        Mood and Affect: Mood normal.        Behavior: Behavior normal.        Thought Content: Thought content normal.        Judgment: Judgment normal.     Results for orders placed or performed in visit on 01/01/24  POC COVID-19 BinaxNow    Collection Time: 01/01/24  4:06 PM  Result Value Ref Range   SARS Coronavirus 2 Ag Negative Negative  POCT Influenza A/B   Collection Time: 01/01/24  4:06 PM  Result Value Ref Range   Influenza A, POC Negative Negative   Influenza B, POC Negative Negative      Assessment & Plan:   Problem List Items Addressed This Visit       Cardiovascular and Mediastinum   Hot flashes due to menopause   Chronic, stable. Clonidine  has improved her hot flashes, though occasional episodes persist. Future tapering will be considered while monitoring blood pressure. Continue clonidine  0.1mg  daily for 90 days, reassess hot flashes, and consider tapering clonidine  in 90 days. Monitor blood pressure.      Relevant Medications   cloNIDine  (CATAPRES ) 0.1 MG tablet     Digestive   GERD (gastroesophageal reflux disease)   Her GERD is managed with pantoprazole  and famotidine , with famotidine  being more effective. Continue pantoprazole  40mg  BID and famotidine  20mg  daily.        Musculoskeletal and Integument   Age-related osteoporosis without current pathological fracture   Updated DEXA scan on 09/19/23 showed a T-score of -2.8.  Continue prolia  injections, calcium, vitamin D, and exercise. There has been some confusion on to whether she is getting prolia  injections at our office vs osteoporosis clinic. Will reach out when we get an answer.       Relevant Orders   VITAMIN D 25 Hydroxy (Vit-D Deficiency, Fractures)     Other   Anxiety   Chronic, stable. Venlafaxine  has been increased and is providing benefit. Reassessment is planned, taking into account family stressors and potential tapering. Continue venlafaxine  150mg  daily for 90 days and reassess anxiety management in 90 days.      Relevant Medications   clonazePAM  (KLONOPIN ) 0.5 MG tablet   Elevated LFTs   She is currently following with GI for this. Check CMP today.       Hyperlipidemia   Chronic, improving.  Currently controlled with diet.  Check CMP, CBC, lipid panel today.       Relevant Medications   cloNIDine  (CATAPRES ) 0.1 MG tablet   Other Relevant Orders   CBC with Differential/Platelet   Comprehensive metabolic panel with GFR   Lipid panel   Routine general medical examination at a health care facility - Primary   Health maintenance reviewed and updated. Discussed nutrition, exercise. Follow-up 1 year.  Follow up plan: Return in about 2 months (around 05/06/2024) for hot flashes.   LABORATORY TESTING:  - Pap smear: not applicable  IMMUNIZATIONS:   - Tdap: Tetanus vaccination status reviewed: last tetanus booster within 10 years. - Influenza: Postponed to flu season - Pneumovax: Up to date - Prevnar: Up to date - HPV: Not applicable - Shingrix vaccine: Declined  SCREENING: -Mammogram: Up to date  - Colonoscopy: Up to date  - Bone Density: Up to date   PATIENT COUNSELING:   Advised to take 1 mg of folate supplement per day if capable of pregnancy.   Sexuality: Discussed sexually transmitted diseases, partner selection, use of condoms, avoidance of unintended pregnancy  and contraceptive alternatives.   Advised to avoid cigarette smoking.  I discussed with the patient that most people either abstain from alcohol or drink within safe limits (<=14/week and <=4 drinks/occasion for males, <=7/weeks and <= 3 drinks/occasion for females) and that the risk for alcohol disorders and other health effects rises proportionally with the number of drinks per week and how often a drinker exceeds daily limits.  Discussed cessation/primary prevention of drug use and availability of treatment for abuse.   Diet: Encouraged to adjust caloric intake to maintain  or achieve ideal body weight, to reduce intake of dietary saturated fat and total fat, to limit sodium intake by avoiding high sodium foods and not adding table salt, and to maintain adequate dietary potassium and calcium preferably from fresh fruits,  vegetables, and low-fat dairy products.    stressed the importance of regular exercise  Injury prevention: Discussed safety belts, safety helmets, smoke detector, smoking near bedding or upholstery.   Dental health: Discussed importance of regular tooth brushing, flossing, and dental visits.    NEXT PREVENTATIVE PHYSICAL DUE IN 1 YEAR. Return in about 2 months (around 05/06/2024) for hot flashes.  Jerika Wales A Danette Weinfeld

## 2024-03-06 NOTE — Patient Instructions (Signed)
 It was great to see you!  We are checking your labs today and will let you know the results via mychart/phone.   Start black kohosh daily   Let's follow-up in 2 months, sooner if you have concerns.  If a referral was placed today, you will be contacted for an appointment. Please note that routine referrals can sometimes take up to 3-4 weeks to process. Please call our office if you haven't heard anything after this time frame.  Take care,  Rheba Cedar, NP

## 2024-03-06 NOTE — Assessment & Plan Note (Signed)
 Her GERD is managed with pantoprazole  and famotidine , with famotidine  being more effective. Continue pantoprazole  40mg  BID and famotidine  20mg  daily.

## 2024-03-06 NOTE — Assessment & Plan Note (Signed)
 She is currently following with GI for this. Check CMP today.

## 2024-03-06 NOTE — Assessment & Plan Note (Signed)
Health maintenance reviewed and updated. Discussed nutrition, exercise. Follow-up 1 year.

## 2024-03-06 NOTE — Assessment & Plan Note (Signed)
 Chronic, stable. Clonidine has improved her hot flashes, though occasional episodes persist. Future tapering will be considered while monitoring blood pressure. Continue clonidine 0.1mg  daily for 90 days, reassess hot flashes, and consider tapering clonidine in 90 days. Monitor blood pressure.

## 2024-03-06 NOTE — Assessment & Plan Note (Signed)
 Chronic, stable. Venlafaxine has been increased and is providing benefit. Reassessment is planned, taking into account family stressors and potential tapering. Continue venlafaxine 150mg  daily for 90 days and reassess anxiety management in 90 days.

## 2024-03-06 NOTE — Assessment & Plan Note (Signed)
 Chronic, improving.  Currently controlled with diet. Check CMP, CBC, lipid panel today.

## 2024-03-07 ENCOUNTER — Ambulatory Visit: Payer: Self-pay | Admitting: Nurse Practitioner

## 2024-03-07 LAB — VITAMIN D 25 HYDROXY (VIT D DEFICIENCY, FRACTURES): VITD: 47.16 ng/mL (ref 30.00–100.00)

## 2024-03-10 ENCOUNTER — Encounter: Payer: Self-pay | Admitting: Podiatry

## 2024-03-10 ENCOUNTER — Ambulatory Visit: Admitting: Podiatry

## 2024-03-10 DIAGNOSIS — M79675 Pain in left toe(s): Secondary | ICD-10-CM

## 2024-03-10 DIAGNOSIS — B351 Tinea unguium: Secondary | ICD-10-CM

## 2024-03-10 DIAGNOSIS — M79674 Pain in right toe(s): Secondary | ICD-10-CM

## 2024-03-10 NOTE — Progress Notes (Signed)

## 2024-03-12 ENCOUNTER — Telehealth: Payer: Self-pay

## 2024-03-12 ENCOUNTER — Other Ambulatory Visit (HOSPITAL_COMMUNITY): Payer: Self-pay

## 2024-03-12 NOTE — Telephone Encounter (Signed)
 Pt ready for scheduling for PROLIA  on or after : 05/20/24  Option# 1: Buy/Bill (Office supplied medication)  Out-of-pocket cost due at time of clinic visit: $357  Number of injection/visits approved: 2  Primary: [employer] Prolia  co-insurance: 20% Admin fee co-insurance: 20%  Secondary: --- Prolia  co-insurance:  Admin fee co-insurance:   Medical Benefit Details: Date Benefits were checked: 03/12/24 Deductible: NO/ Coinsurance: 20%/ Admin Fee: 20%  Prior Auth: APPROVED PA# [id number] Expiration Date: 10/11/23-10/10/24  # of doses approved: 2 ----------------------------------------------------------------------- Option# 2- Med Obtained from pharmacy:  Pharmacy benefit: Copay $645.79 (Paid to pharmacy) Admin Fee: 20% (Pay at clinic)  Prior Auth: N/A PA# Expiration Date:   # of doses approved:   If patient wants fill through the pharmacy benefit please send prescription to: [facility], and include estimated need by date in rx notes. Pharmacy will ship medication directly to the office.  Patient NOT eligible for Prolia  Copay Card. Copay Card can make patient's cost as little as $25. Link to apply: [email or web address]://www.[email or web address]  ** This summary of benefits is an estimation of the patient's out-of-pocket cost. Exact cost may very based on individual plan coverage.

## 2024-03-12 NOTE — Telephone Encounter (Signed)
 Prolia  VOB initiated via [email or web address]  Next Prolia  inj DUE: 05/20/24

## 2024-03-12 NOTE — Telephone Encounter (Signed)
 Lisa Spence

## 2024-03-18 NOTE — Telephone Encounter (Signed)
 I called and spoke with patient and she would like to proceed with option 1.

## 2024-03-18 NOTE — Telephone Encounter (Signed)
 Pt can be scheduled for Prolia  injection on or after 05/20/24 if medication received to the office.

## 2024-03-19 NOTE — Telephone Encounter (Signed)
 Noted. Documentation done on other 03/12/24 phone encounter.

## 2024-03-19 NOTE — Telephone Encounter (Signed)
 Email sent to physician services for prolia .

## 2024-03-20 NOTE — Telephone Encounter (Signed)
 Pt Prolia  arrived to the office. Placed in the refrigerator. Next injection due: 04/2024.

## 2024-04-14 ENCOUNTER — Other Ambulatory Visit: Payer: Self-pay | Admitting: Nurse Practitioner

## 2024-04-14 DIAGNOSIS — J069 Acute upper respiratory infection, unspecified: Secondary | ICD-10-CM

## 2024-04-17 NOTE — Telephone Encounter (Signed)
 Requesting: PROMETHAZINE  DM ORAL SOLUTION  Last Visit: 03/06/2024 Next Visit: 04/14/2024 Last Refill: The original prescription was discontinued on 03/06/2024 by Nedra Tinnie LABOR, NP. Renewing this prescription may not be appropriate.   Please Advise

## 2024-05-06 ENCOUNTER — Ambulatory Visit: Admitting: Nurse Practitioner

## 2024-05-06 ENCOUNTER — Encounter: Payer: Self-pay | Admitting: Nurse Practitioner

## 2024-05-06 VITALS — BP 118/80 | HR 69 | Temp 98.2°F | Ht 66.0 in | Wt 178.2 lb

## 2024-05-06 DIAGNOSIS — J302 Other seasonal allergic rhinitis: Secondary | ICD-10-CM

## 2024-05-06 DIAGNOSIS — N951 Menopausal and female climacteric states: Secondary | ICD-10-CM

## 2024-05-06 DIAGNOSIS — K2100 Gastro-esophageal reflux disease with esophagitis, without bleeding: Secondary | ICD-10-CM

## 2024-05-06 DIAGNOSIS — F419 Anxiety disorder, unspecified: Secondary | ICD-10-CM

## 2024-05-06 DIAGNOSIS — R7989 Other specified abnormal findings of blood chemistry: Secondary | ICD-10-CM | POA: Diagnosis not present

## 2024-05-06 DIAGNOSIS — J069 Acute upper respiratory infection, unspecified: Secondary | ICD-10-CM

## 2024-05-06 MED ORDER — PROMETHAZINE-DM 6.25-15 MG/5ML PO SYRP: 1.2500 mL | ORAL_SOLUTION | Freq: Four times a day (QID) | ORAL | 1 refills | Status: AC | PRN

## 2024-05-06 NOTE — Progress Notes (Signed)
 Established Patient Office Visit  Subjective   Patient ID: Lisa Spence, female    DOB: [birth year]  Age: 73 y.o. MRN: [id number]  Chief Complaint  Patient presents with   Hot flashes due to menopause    Hot flashes are somewhat better     HPI Discussed the use of AI scribe software for clinical note transcription with the patient, who gave verbal consent to proceed.  History of Present Illness   Lisa Spence is a 73 year old female who presents for a follow-up on hot flashes and anxiety management.  She experiences persistent hot flashes despite a hysterectomy 30 years ago. She uses black cohosh and clonidine , which may be providing some relief. Significant anxiety is present, particularly related to family stressors. She takes clonazepam , sometimes twice daily, and venlafaxine , which she plans to continue.  Liver enzyme levels have been persistently elevated, with plans for follow-up blood work with GI. Allergy symptoms are managed with loratadine  10mg  daily, flonase  nasal spray, and promethazine  cough syrup during severe episodes. Occasional right-sided ear pain is attributed to allergies.  Acid reflux is somewhat improved with famotidine  and occasional Tums. Regular bowel movements occur, but hard stools are managed with Miralax  and stool softeners. She considers increasing Miralax  to daily use to prevent straining. She maintains a healthy lifestyle with regular physical activity and a meat-free diet. No chest pain or shortness of breath.       ROS See pertinent positives and negatives per HPI.    Objective:     BP 118/80 (BP Location: Right Arm, Patient Position: Sitting, Cuff Size: Small)   Pulse 69   Temp 98.2 F (36.8 C) (Temporal)   Ht 5' 6 (1.676 m)   Wt 178 lb 3.2 oz (80.8 kg)   SpO2 95%   BMI 28.76 kg/m  BP Readings from Last 3 Encounters:  05/06/24 118/80  03/06/24 124/82  01/01/24 (!) 144/88   Wt Readings from Last 3 Encounters:  05/06/24 178 lb  3.2 oz (80.8 kg)  03/06/24 175 lb 12.8 oz (79.7 kg)  01/01/24 176 lb 3.2 oz (79.9 kg)      Physical Exam Vitals and nursing note reviewed.  Constitutional:      General: She is not in acute distress.    Appearance: Normal appearance.  HENT:     Head: Normocephalic.  Eyes:     Conjunctiva/sclera: Conjunctivae normal.  Cardiovascular:     Rate and Rhythm: Normal rate and regular rhythm.     Pulses: Normal pulses.     Heart sounds: Normal heart sounds.  Pulmonary:     Effort: Pulmonary effort is normal.     Breath sounds: Normal breath sounds.  Musculoskeletal:     Cervical back: Normal range of motion.  Skin:    General: Skin is warm.  Neurological:     General: No focal deficit present.     Mental Status: She is alert and oriented to person, place, and time.  Psychiatric:        Mood and Affect: Mood normal.        Behavior: Behavior normal.        Thought Content: Thought content normal.        Judgment: Judgment normal.    The 10-year ASCVD risk score (Arnett DK, et al., 2019) is: 12.2%    Assessment & Plan:   Problem List Items Addressed This Visit       Cardiovascular and Mediastinum   Hot flashes due  to menopause - Primary   She experiences persistent hot flashes despite a hysterectomy 30 years ago. Continue clonidine  0.1mg  daily and reassess symptoms in 90 days. Consider a gynecology referral if symptoms persist.        Respiratory   Seasonal allergic rhinitis   Chronic cough is managed with loratadine  10mg  daily, flonase  nasal spray, and promethazine  cough syrup. Refill promethazine  cough syrup and continue allergy medication and nasal spray.      Relevant Medications   promethazine -dextromethorphan (PROMETHAZINE -DM) 6.25-15 MG/5ML syrup     Digestive   GERD (gastroesophageal reflux disease)   GERD symptoms have improved with famotidine , though she experiences strange dreams and prolonged sleep. Discuss famotidine  and GERD management with a  gastroenterologist. Continue famotidine  as needed.         Other   Anxiety   Chronic, stable. High anxiety levels due to family stressors are effectively managed with clonazepam  0.5mg  BID prn and venlafaxine  150mg  daily. Refill sent to the pharmacy. PDMP reviewed.       Elevated LFTs   Liver enzymes remain elevated with no significant changes expected. She has a follow-up appointment with GI soon. Continue to limit tylenol  and alcohol.       Return in about 3 months (around 08/06/2024) for chronic management .    Tinnie DELENA Harada, NP

## 2024-05-06 NOTE — Patient Instructions (Addendum)
 It was great to see you!  I have refilled the cough syrup   Keep taking your medications, it's ok to take 2 clonazepam  at the same time as needed   You can get your RSV and shingles vaccines at the pharmacy  Let's follow-up in 3 months, sooner if you have concerns.  If a referral was placed today, you will be contacted for an appointment. Please note that routine referrals can sometimes take up to 3-4 weeks to process. Please call our office if you haven't heard anything after this time frame.  Take care,  Tinnie Harada, NP

## 2024-05-07 ENCOUNTER — Encounter: Payer: Self-pay | Admitting: Nurse Practitioner

## 2024-05-07 DIAGNOSIS — J302 Other seasonal allergic rhinitis: Secondary | ICD-10-CM | POA: Insufficient documentation

## 2024-05-07 NOTE — Assessment & Plan Note (Signed)
 Liver enzymes remain elevated with no significant changes expected. She has a follow-up appointment with GI soon. Continue to limit tylenol  and alcohol.

## 2024-05-07 NOTE — Assessment & Plan Note (Signed)
 Chronic, stable. High anxiety levels due to family stressors are effectively managed with clonazepam  0.5mg  BID prn and venlafaxine  150mg  daily. Refill sent to the pharmacy. PDMP reviewed.

## 2024-05-07 NOTE — Assessment & Plan Note (Signed)
 GERD symptoms have improved with famotidine , though she experiences strange dreams and prolonged sleep. Discuss famotidine  and GERD management with a gastroenterologist. Continue famotidine  as needed.

## 2024-05-07 NOTE — Assessment & Plan Note (Signed)
 She experiences persistent hot flashes despite a hysterectomy 30 years ago. Continue clonidine  0.1mg  daily and reassess symptoms in 90 days. Consider a gynecology referral if symptoms persist.

## 2024-05-07 NOTE — Assessment & Plan Note (Signed)
 Chronic cough is managed with loratadine  10mg  daily, flonase  nasal spray, and promethazine  cough syrup. Refill promethazine  cough syrup and continue allergy medication and nasal spray.

## 2024-05-08 ENCOUNTER — Ambulatory Visit

## 2024-05-22 ENCOUNTER — Ambulatory Visit

## 2024-05-22 VITALS — BP 124/72

## 2024-05-22 DIAGNOSIS — M810 Age-related osteoporosis without current pathological fracture: Secondary | ICD-10-CM | POA: Diagnosis not present

## 2024-05-22 MED ORDER — DENOSUMAB 60 MG/ML ~~LOC~~ SOSY: 60.0000 mg | PREFILLED_SYRINGE | Freq: Once | SUBCUTANEOUS | Status: AC

## 2024-05-22 NOTE — Progress Notes (Signed)
 Per orders of Lauren McElwee, injection of Prolia  given in the LT arm by [facility], cma.  Patient tolerated injection well.  Advised that next one will be due in February 2026. Dm/cma

## 2024-05-23 ENCOUNTER — Telehealth: Payer: Self-pay | Admitting: Nurse Practitioner

## 2024-05-23 DIAGNOSIS — N958 Other specified menopausal and perimenopausal disorders: Secondary | ICD-10-CM

## 2024-05-23 MED ORDER — PREMARIN 0.625 MG/GM VA CREA: TOPICAL_CREAM | VAGINAL | 5 refills | Status: AC

## 2024-05-23 NOTE — Telephone Encounter (Signed)
 Copied from CRM [id number]. Topic: Clinical - Medication Refill >> May 23, 2024  3:56 PM Thersia C wrote: Medication: PREMARIN  vaginal cream  Has the patient contacted their pharmacy? Yes (Agent: If no, request that the patient contact the pharmacy for the refill. If patient does not wish to contact the pharmacy document the reason why and proceed with request.) (Agent: If yes, when and what did the pharmacy advise?)  This is the patient's preferred pharmacy:   [id number] - [place], [place] - [address] AT [address] [place] [address] Phone: [phone] Fax: [phone]  Is this the correct pharmacy for this prescription? Yes If no, delete pharmacy and type the correct one.   Has the prescription been filled recently? No  Is the patient out of the medication? Yes  Has the patient been seen for an appointment in the last year OR does the patient have an upcoming appointment? Yes  Can we respond through MyChart? Yes  Agent: Please be advised that Rx refills may take up to 3 business days. We ask that you follow-up with your pharmacy.

## 2024-06-20 ENCOUNTER — Ambulatory Visit: Admitting: Podiatry

## 2024-06-20 ENCOUNTER — Encounter: Payer: Self-pay | Admitting: Podiatry

## 2024-06-20 DIAGNOSIS — M79675 Pain in left toe(s): Secondary | ICD-10-CM

## 2024-06-20 DIAGNOSIS — M2041 Other hammer toe(s) (acquired), right foot: Secondary | ICD-10-CM | POA: Diagnosis not present

## 2024-06-20 DIAGNOSIS — M2042 Other hammer toe(s) (acquired), left foot: Secondary | ICD-10-CM

## 2024-06-20 DIAGNOSIS — M21619 Bunion of unspecified foot: Secondary | ICD-10-CM | POA: Diagnosis not present

## 2024-06-20 DIAGNOSIS — B351 Tinea unguium: Secondary | ICD-10-CM | POA: Diagnosis not present

## 2024-06-20 DIAGNOSIS — M79674 Pain in right toe(s): Secondary | ICD-10-CM | POA: Diagnosis not present

## 2024-06-20 MED ORDER — EFINACONAZOLE 10 % EX SOLN: 1.0000 "application " | Freq: Every day | CUTANEOUS | 2 refills | Status: DC

## 2024-06-20 NOTE — Progress Notes (Signed)
 Subjective: Chief Complaint  Patient presents with   RFC     RFC Non diabetic toenail trim.    73 year old female presents the office today for concerns of thick, elongated toenails that she can has difficulty trimming herself for nail fungus.    He continues to have bunions and hammertoes and she tries doing exercises to help as well as shoes avoid pressure. Objective: AAO x3, NAD DP/PT pulses palpable bilaterally, CRT less than 3 seconds Nails are hypertrophic, dystrophic, brittle, discolored, elongated 10. No surrounding redness or drainage. Tenderness nails 1-5 bilaterally.  There is minimal callus formation on the medial first MPJ bilaterally without any underlying ulceration drainage or signs of infection.  Minimal hyperkeratotic lesion submetatarsal 4 left foot without any underlying ulceration drainage or any signs of infection. Hammertoe contractures noted to digits in the left third toe is rigid, fourth toe with semirigid.  Deformities noted with resultant hyperkeratotic lesions. There is no pain identified to the left ankle. No edema.  No pain with calf compression, swelling, warmth, erythema  Assessment: Symptomatic onychomycosis, hyperkeratotic lesions, hammertoes  Plan: Symptomatic onychomycosis -Sharply to the nails x10 without any complications or bleeding.  Discussed topical medication.  Continue Jublia   Bunion, hammertoe deformities - Results in hyperkeratotic lesion but I sharply debrided any complications or bleeding.  Discussed moisturizer, offloading.  Return in about 3 months (around 09/19/2024) for nail trim, x-ray left ankle.  Lisa Spence Fees DPM

## 2024-06-25 ENCOUNTER — Other Ambulatory Visit: Payer: Self-pay | Admitting: Nurse Practitioner

## 2024-06-25 DIAGNOSIS — F419 Anxiety disorder, unspecified: Secondary | ICD-10-CM

## 2024-06-26 NOTE — Telephone Encounter (Signed)
 Requesting: CLONAZEPAM  0.5MG  TABLETS  Last Visit: 05/06/2024 Next Visit: 08/06/2024 Last Refill: 03/06/2024  Please Advise

## 2024-06-27 ENCOUNTER — Ambulatory Visit (INDEPENDENT_AMBULATORY_CARE_PROVIDER_SITE_OTHER): Payer: Medicare HMO

## 2024-06-27 VITALS — Ht 66.0 in | Wt 170.0 lb

## 2024-06-27 DIAGNOSIS — Z0000 Encounter for general adult medical examination without abnormal findings: Secondary | ICD-10-CM | POA: Diagnosis not present

## 2024-06-27 NOTE — Progress Notes (Signed)
 Subjective:   Lisa Spence is a 73 y.o. who presents for a Medicare Wellness preventive visit.  As a reminder, Annual Wellness Visits don't include a physical exam, and some assessments may be limited, especially if this visit is performed virtually. We may recommend an in-person follow-up visit with your provider if needed.  Visit Complete: Virtual I connected with  Sonny JINNY Pepper on 06/27/24 by a video and audio enabled telemedicine application and verified that I am speaking with the correct person using two identifiers.  Patient Location: Home  Provider Location: Office/Clinic  I discussed the limitations of evaluation and management by telemedicine. The patient expressed understanding and agreed to proceed.  Vital Signs: Because this visit was a virtual/telehealth visit, some criteria may be missing or patient reported. Any vitals not documented were not able to be obtained and vitals that have been documented are patient reported.    Persons Participating in Visit: Patient.  AWV Questionnaire: Yes: Patient Medicare AWV questionnaire was completed by the patient on 06/27/2024; I have confirmed that all information answered by patient is correct and no changes since this date.  Cardiac Risk Factors include: advanced age (>44men, >15 women);dyslipidemia     Objective:    Today's Vitals   06/27/24 0802  Weight: 170 lb (77.1 kg)  Height: 5' 6 (1.676 m)   Body mass index is 27.44 kg/m.     06/27/2024    8:10 AM 06/25/2023    8:49 AM 06/20/2022    9:14 AM 05/17/2021   12:18 PM 07/11/2017    8:00 AM 07/05/2017   10:09 AM 06/23/2017   10:44 PM  Advanced Directives  Does Patient Have a Medical Advance Directive? Yes No Yes No No  No  No   Type of Estate agent of [place];Living will  Healthcare Power of [place];Living will      Copy of Healthcare Power of Attorney in Chart? No - copy requested  No - copy requested      Would patient like  information on creating a medical advance directive?    No - Patient declined No - Patient declined  No - Patient declined       Data saved with a previous flowsheet row definition    Current Medications (verified) Outpatient Encounter Medications as of 06/27/2024  Medication Sig   albuterol  (VENTOLIN  HFA) 108 (90 Base) MCG/ACT inhaler INHALE 2 PUFFS INTO THE LUNGS EVERY 6 HOURS AS NEEDED FOR WHEEZING OR SHORTNESS OF BREATH   calcium carbonate (OS-CAL) 1250 (500 Ca) MG chewable tablet Chew 1 tablet by mouth daily.   cholecalciferol (VITAMIN D3) 25 MCG (1000 UNIT) tablet Take 1,000 Units by mouth daily.   clonazePAM  (KLONOPIN ) 0.5 MG tablet TAKE 1 TABLET(0.5 MG) BY MOUTH TWICE DAILY AS NEEDED FOR ANXIETY   cloNIDine  (CATAPRES ) 0.1 MG tablet Take 1 tablet (0.1 mg total) by mouth daily.   conjugated estrogens  (PREMARIN ) vaginal cream INSERT 1 APPLICATORFUL VAGINALLY 2 TIMES A WEEK   Efinaconazole  10 % SOLN Apply 1 application  topically daily.   famotidine  (PEPCID ) 20 MG tablet Take 1 tablet (20 mg total) by mouth at bedtime.   fluticasone  (FLONASE ) 50 MCG/ACT nasal spray Place 2 sprays into both nostrils daily.   hydrocortisone 2.5 % cream Apply topically daily.   ketoconazole (NIZORAL) 2 % cream Apply topically daily.   loratadine  (CLARITIN ) 10 MG tablet Take 1 tablet (10 mg total) by mouth daily.   Misc Natural Products (FOCUSED MIND PO) Take by  mouth. Daily vitamins   Multiple Vitamins-Minerals (ONE A DAY WOMEN 50 PLUS PO) Take 1 tablet by mouth daily.   pantoprazole  (PROTONIX ) 40 MG tablet Take 1 tablet (40 mg total) by mouth 2 (two) times daily.   polyethylene glycol (MIRALAX  / GLYCOLAX ) 17 g packet Take 17 g by mouth as needed.   promethazine -dextromethorphan (PROMETHAZINE -DM) 6.25-15 MG/5ML syrup Take 1.3 mLs by mouth 4 (four) times daily as needed for cough.   venlafaxine  XR (EFFEXOR -XR) 150 MG 24 hr capsule TAKE 1 CAPSULE(150 MG) BY MOUTH DAILY WITH BREAKFAST   Facility-Administered  Encounter Medications as of 06/27/2024  Medication   denosumab  (PROLIA ) injection 60 mg    Allergies (verified) Patient has no known allergies.   History: Past Medical History:  Diagnosis Date   Allergy    Anxiety    Fatty liver    GERD (gastroesophageal reflux disease)    Internal hemorrhoids    Osteoporosis    Past Surgical History:  Procedure Laterality Date   ABDOMINAL HYSTERECTOMY     COLONOSCOPY     ESOPHAGEAL MANOMETRY N/A 12/20/2022   Procedure: ESOPHAGEAL MANOMETRY (EM);  Surgeon: Shila Gustav GAILS, MD;  Location: [facility];  Service: Gastroenterology;  Laterality: N/A;   ORIF ANKLE FRACTURE Left 07/09/2017   Procedure: OPEN REDUCTION INTERNAL FIXATION (ORIF) ANKLE FRACTURE;  Surgeon: Sharl Selinda Dover, MD;  Location: [facility] OR;  Service: Orthopedics;  Laterality: Left;  90 mins   Family History  Problem Relation Age of Onset   Heart disease Mother    Heart disease Father    Heart disease Maternal Grandmother    Heart disease Maternal Grandfather    Heart disease Paternal Grandmother    Heart disease Paternal Grandfather    Colon cancer Neg Hx    Esophageal cancer Neg Hx    Rectal cancer Neg Hx    Stomach cancer Neg Hx    Social History   Socioeconomic History   Marital status: Single    Spouse name: Not on file   Number of children: Not on file   Years of education: Not on file   Highest education level: Bachelor's degree (e.g., BA, AB, BS)  Occupational History   Not on file  Tobacco Use   Smoking status: Never   Smokeless tobacco: Never  Vaping Use   Vaping status: Never Used  Substance and Sexual Activity   Alcohol use: No    Comment: no longer drinks, drank 6 glasses wine/week stopped in 2007   Drug use: No   Sexual activity: Not Currently  Other Topics Concern   Not on file  Social History Narrative   Not on file   Social Drivers of Health   Financial Resource Strain: Low Risk  (06/27/2024)   Overall Financial Resource Strain  (CARDIA)    Difficulty of Paying Living Expenses: Not hard at all  Food Insecurity: No Food Insecurity (06/27/2024)   Hunger Vital Sign    Worried About Running Out of Food in the Last Year: Never true    Ran Out of Food in the Last Year: Never true  Transportation Needs: No Transportation Needs (06/27/2024)   PRAPARE - Administrator, Civil Service (Medical): No    Lack of Transportation (Non-Medical): No  Physical Activity: Sufficiently Active (06/27/2024)   Exercise Vital Sign    Days of Exercise per Week: 7 days    Minutes of Exercise per Session: 70 min  Stress: Stress Concern Present (06/27/2024)   [employer] of Occupational  Health - Occupational Stress Questionnaire    Feeling of Stress: To some extent  Social Connections: Moderately Integrated (06/27/2024)   Social Connection and Isolation Panel    Frequency of Communication with Friends and Family: More than three times a week    Frequency of Social Gatherings with Friends and Family: Three times a week    Attends Religious Services: More than 4 times per year    Active Member of Clubs or Organizations: Yes    Attends Engineer, structural: More than 4 times per year    Marital Status: Never married    Tobacco Counseling Counseling given: Not Answered    Clinical Intake:  Pre-visit preparation completed: Yes  Pain : No/denies pain     Nutritional Status: BMI 25 -29 Overweight Nutritional Risks: None Diabetes: No  No results found for: HGBA1C   How often do you need to have someone help you when you read instructions, pamphlets, or other written materials from your doctor or pharmacy?: 1 - Never  Interpreter Needed?: No  Information entered by :: NAllen LPN   Activities of Daily Living     06/27/2024    7:16 AM  In your present state of health, do you have any difficulty performing the following activities:  Hearing? 0  Vision? 0  Difficulty concentrating or making decisions? 0   Walking or climbing stairs? 0  Dressing or bathing? 0  Doing errands, shopping? 0  Preparing Food and eating ? N  Using the Toilet? N  In the past six months, have you accidently leaked urine? N  Do you have problems with loss of bowel control? N  Managing your Medications? N  Managing your Finances? N  Housekeeping or managing your Housekeeping? N    Patient Care Team: Nedra Tinnie LABOR, NP as PCP - General (Internal Medicine)  I have updated your Care Teams any recent Medical Services you may have received from other providers in the past year.     Assessment:   This is a routine wellness examination for [place].  Hearing/Vision screen Hearing Screening - Comments:: Denies hearing issues Vision Screening - Comments:: Regular eye exams, [facility]   Goals Addressed             This Visit's Progress    Patient Stated       06/27/2024, wants to lose weight       Depression Screen     06/27/2024    8:11 AM 03/06/2024    8:43 AM 06/25/2023    8:50 AM 06/18/2023    8:51 AM 02/13/2023    8:33 AM 06/20/2022    9:11 AM 06/20/2022    9:10 AM  PHQ 2/9 Scores  PHQ - 2 Score 2 0 0 0 0 2 0  PHQ- 9 Score 2 2  1 1       Fall Risk     06/27/2024    8:11 AM 05/06/2024    8:26 AM 03/06/2024    8:43 AM 06/24/2023    1:44 PM 06/18/2023    8:07 AM  Fall Risk   Falls in the past year? 0 0 0 0 0  Number falls in past yr: 0 0 0 0 0  Injury with Fall? 0 0 0 0 0  Risk for fall due to : Medication side effect No Fall Risks No Fall Risks Medication side effect No Fall Risks  Follow up Falls evaluation completed;Falls prevention discussed Falls evaluation completed Falls evaluation completed Falls prevention  discussed;Falls evaluation completed Falls evaluation completed    MEDICARE RISK AT HOME:  Medicare Risk at Home Any stairs in or around the home?: (Patient-Rptd) Yes If so, are there any without handrails?: (Patient-Rptd) No Home free of loose throw rugs in walkways, pet beds,  electrical cords, etc?: (Patient-Rptd) Yes Adequate lighting in your home to reduce risk of falls?: (Patient-Rptd) Yes Life alert?: (Patient-Rptd) No Use of a cane, walker or w/c?: (Patient-Rptd) No Grab bars in the bathroom?: (Patient-Rptd) Yes Shower chair or bench in shower?: (Patient-Rptd) No Elevated toilet seat or a handicapped toilet?: (Patient-Rptd) Yes  TIMED UP AND GO:  Was the test performed?  No  Cognitive Function: 6CIT completed        06/27/2024    8:14 AM 06/25/2023    8:50 AM 06/20/2022    9:16 AM 05/17/2021   11:03 AM  6CIT Screen  What Year? 0 points 0 points 0 points 0 points  What month? 0 points 0 points 0 points 0 points  What time? 0 points 0 points 0 points 0 points  Count back from 20 0 points 0 points 0 points 0 points  Months in reverse 0 points 0 points 0 points 0 points  Repeat phrase 0 points 0 points 0 points 0 points  Total Score 0 points 0 points 0 points 0 points    Immunizations Immunization History  Administered Date(s) Administered   Fluad Quad(high Dose 65+) 07/23/2019, 08/11/2020, 09/01/2021, 07/19/2022   Fluad Trivalent(High Dose 65+) 06/18/2023   PFIZER Comirnaty(Gray Top)Covid-19 Tri-Sucrose Vaccine 12/25/2019, 01/23/2020, 10/12/2020   Pneumococcal Conjugate-13 07/23/2019   Pneumococcal Polysaccharide-23 08/11/2020   Tdap 11/08/2021   Zoster Recombinant(Shingrix) 05/11/2024    Screening Tests Health Maintenance  Topic Date Due   Influenza Vaccine  12/23/2024 (Originally 04/25/2024)   Zoster Vaccines- Shingrix (2 of 2) 07/06/2024   Mammogram  11/11/2024   Medicare Annual Wellness (AWV)  06/27/2025   Colonoscopy  11/28/2027   DTaP/Tdap/Td (2 - Td or Tdap) 11/09/2031   Pneumococcal Vaccine: 50+ Years  Completed   DEXA SCAN  Completed   Hepatitis C Screening  Completed   HPV VACCINES  Aged Out   Meningococcal B Vaccine  Aged Out   COVID-19 Vaccine  Discontinued    Health Maintenance Items Addressed: Vaccines Due: flu and  second shingles in December  Additional Screening:  Vision Screening: Recommended annual ophthalmology exams for early detection of glaucoma and other disorders of the eye. Is the patient up to date with their annual eye exam?  Yes  Who is the provider or what is the name of the office in which the patient attends annual eye exams? [facility]  Dental Screening: Recommended annual dental exams for proper oral hygiene  Community Resource Referral / Chronic Care Management: CRR required this visit?  No   CCM required this visit?  No   Plan:    I have personally reviewed and noted the following in the patient's chart:   Medical and social history Use of alcohol, tobacco or illicit drugs  Current medications and supplements including opioid prescriptions. Patient is not currently taking opioid prescriptions. Functional ability and status Nutritional status Physical activity Advanced directives List of other physicians Hospitalizations, surgeries, and ER visits in previous 12 months Vitals Screenings to include cognitive, depression, and falls Referrals and appointments  In addition, I have reviewed and discussed with patient certain preventive protocols, quality metrics, and best practice recommendations. A written personalized care plan for preventive services as well as  general preventive health recommendations were provided to patient.   Ardella FORBES Dawn, LPN   [id number]   After Visit Summary: (MyChart) Due to this being a telephonic visit, the after visit summary with patients personalized plan was offered to patient via MyChart   Notes: Nothing significant to report at this time.

## 2024-06-27 NOTE — Patient Instructions (Signed)
 Lisa Spence,  Thank you for taking the time for your Medicare Wellness Visit. I appreciate your continued commitment to your health goals. Please review the care plan we discussed, and feel free to reach out if I can assist you further.  Medicare recommends these wellness visits once per year to help you and your care team stay ahead of potential health issues. These visits are designed to focus on prevention, allowing your provider to concentrate on managing your acute and chronic conditions during your regular appointments.  Please note that Annual Wellness Visits do not include a physical exam. Some assessments may be limited, especially if the visit was conducted virtually. If needed, we may recommend a separate in-person follow-up with your provider.  Ongoing Care Seeing your primary care provider every 3 to 6 months helps us  monitor your health and provide consistent, personalized care.   Referrals If a referral was made during today's visit and you haven't received any updates within two weeks, please contact the referred provider directly to check on the status.  Recommended Screenings:  Health Maintenance  Topic Date Due   Flu Shot  12/23/2024*   Zoster (Shingles) Vaccine (2 of 2) 07/06/2024   Breast Cancer Screening  11/11/2024   Medicare Annual Wellness Visit  06/27/2025   Colon Cancer Screening  11/28/2027   DTaP/Tdap/Td vaccine (2 - Td or Tdap) 11/09/2031   Pneumococcal Vaccine for age over 37  Completed   DEXA scan (bone density measurement)  Completed   Hepatitis C Screening  Completed   HPV Vaccine  Aged Out   Meningitis B Vaccine  Aged Out   COVID-19 Vaccine  Discontinued  *Topic was postponed. The date shown is not the original due date.       06/27/2024    8:10 AM  Advanced Directives  Does Patient Have a Medical Advance Directive? Yes  Type of Estate agent of [place];Living will  Copy of Healthcare Power of Attorney in Chart? No - copy  requested   Advance Care Planning is important because it: Ensures you receive medical care that aligns with your values, goals, and preferences. Provides guidance to your family and loved ones, reducing the emotional burden of decision-making during critical moments.  Vision: Annual vision screenings are recommended for early detection of glaucoma, cataracts, and diabetic retinopathy. These exams can also reveal signs of chronic conditions such as diabetes and high blood pressure.  Dental: Annual dental screenings help detect early signs of oral cancer, gum disease, and other conditions linked to overall health, including heart disease and diabetes.  Please see the attached documents for additional preventive care recommendations.

## 2024-07-22 ENCOUNTER — Other Ambulatory Visit: Payer: Self-pay | Admitting: Family Medicine

## 2024-07-22 DIAGNOSIS — J069 Acute upper respiratory infection, unspecified: Secondary | ICD-10-CM

## 2024-07-28 ENCOUNTER — Encounter: Payer: Self-pay | Admitting: Radiology

## 2024-07-31 ENCOUNTER — Other Ambulatory Visit: Payer: Self-pay | Admitting: Family Medicine

## 2024-07-31 DIAGNOSIS — J069 Acute upper respiratory infection, unspecified: Secondary | ICD-10-CM

## 2024-08-06 ENCOUNTER — Ambulatory Visit (INDEPENDENT_AMBULATORY_CARE_PROVIDER_SITE_OTHER): Admitting: Nurse Practitioner

## 2024-08-06 ENCOUNTER — Ambulatory Visit: Payer: Self-pay | Admitting: Nurse Practitioner

## 2024-08-06 VITALS — BP 114/78 | HR 77 | Temp 97.7°F | Ht 66.0 in | Wt 177.4 lb

## 2024-08-06 DIAGNOSIS — R002 Palpitations: Secondary | ICD-10-CM | POA: Diagnosis not present

## 2024-08-06 DIAGNOSIS — R3912 Poor urinary stream: Secondary | ICD-10-CM | POA: Diagnosis not present

## 2024-08-06 DIAGNOSIS — F419 Anxiety disorder, unspecified: Secondary | ICD-10-CM | POA: Diagnosis not present

## 2024-08-06 DIAGNOSIS — H9201 Otalgia, right ear: Secondary | ICD-10-CM | POA: Diagnosis not present

## 2024-08-06 DIAGNOSIS — G8929 Other chronic pain: Secondary | ICD-10-CM

## 2024-08-06 DIAGNOSIS — K2100 Gastro-esophageal reflux disease with esophagitis, without bleeding: Secondary | ICD-10-CM

## 2024-08-06 DIAGNOSIS — Z23 Encounter for immunization: Secondary | ICD-10-CM | POA: Diagnosis not present

## 2024-08-06 DIAGNOSIS — R7989 Other specified abnormal findings of blood chemistry: Secondary | ICD-10-CM | POA: Diagnosis not present

## 2024-08-06 LAB — POCT URINALYSIS DIPSTICK
Bilirubin, UA: POSITIVE
Blood, UA: NEGATIVE
Glucose, UA: NEGATIVE
Ketones, UA: POSITIVE
Leukocytes, UA: NEGATIVE
Nitrite, UA: NEGATIVE
Protein, UA: NEGATIVE
Spec Grav, UA: 1.025 (ref 1.010–1.025)
Urobilinogen, UA: 0.2 U/dL
pH, UA: 6 (ref 5.0–8.0)

## 2024-08-06 LAB — CBC WITH DIFFERENTIAL/PLATELET
Basophils Absolute: 0.1 K/uL (ref 0.0–0.1)
Basophils Relative: 0.6 % (ref 0.0–3.0)
Eosinophils Absolute: 0.5 K/uL (ref 0.0–0.7)
Eosinophils Relative: 5.4 % — ABNORMAL HIGH (ref 0.0–5.0)
HCT: 44.2 % (ref 36.0–46.0)
Hemoglobin: 14.9 g/dL (ref 12.0–15.0)
Lymphocytes Relative: 35.6 % (ref 12.0–46.0)
Lymphs Abs: 3.1 K/uL (ref 0.7–4.0)
MCHC: 33.6 g/dL (ref 30.0–36.0)
MCV: 88.1 fl (ref 78.0–100.0)
Monocytes Absolute: 0.6 K/uL (ref 0.1–1.0)
Monocytes Relative: 6.8 % (ref 3.0–12.0)
Neutro Abs: 4.5 K/uL (ref 1.4–7.7)
Neutrophils Relative %: 51.6 % (ref 43.0–77.0)
Platelets: 246 K/uL (ref 150.0–400.0)
RBC: 5.02 Mil/uL (ref 3.87–5.11)
RDW: 12.6 % (ref 11.5–15.5)
WBC: 8.8 K/uL (ref 4.0–10.5)

## 2024-08-06 LAB — COMPREHENSIVE METABOLIC PANEL WITH GFR
ALT: 59 U/L — ABNORMAL HIGH (ref 0–35)
AST: 41 U/L — ABNORMAL HIGH (ref 0–37)
Albumin: 4.8 g/dL (ref 3.5–5.2)
Alkaline Phosphatase: 58 U/L (ref 39–117)
BUN: 16 mg/dL (ref 6–23)
CO2: 24 meq/L (ref 19–32)
Calcium: 9.2 mg/dL (ref 8.4–10.5)
Chloride: 104 meq/L (ref 96–112)
Creatinine, Ser: 0.8 mg/dL (ref 0.40–1.20)
GFR: 74.01 mL/min (ref >60.00)
Glucose, Bld: 99 mg/dL (ref 70–99)
Potassium: 3.9 meq/L (ref 3.5–5.1)
Sodium: 138 meq/L (ref 135–145)
Total Bilirubin: 0.3 mg/dL (ref 0.2–1.2)
Total Protein: 7.2 g/dL (ref 6.0–8.3)

## 2024-08-06 LAB — TSH: TSH: 3.06 u[IU]/mL (ref 0.35–5.50)

## 2024-08-06 MED ORDER — CLONAZEPAM 0.5 MG PO TABS: 0.5000 mg | ORAL_TABLET | Freq: Two times a day (BID) | ORAL | 2 refills | Status: AC | PRN

## 2024-08-06 MED ORDER — CLONIDINE HCL 0.1 MG PO TABS: 0.1000 mg | ORAL_TABLET | Freq: Every day | ORAL | 1 refills | Status: AC

## 2024-08-06 NOTE — Assessment & Plan Note (Signed)
 Intermittent palpitations are possibly stress-related, with no shortness of breath or dizziness. Blood pressure is well-controlled. Ordered thyroid  function tests to rule out thyroid -related causes. Monitor frequency and duration of palpitations. She declines EKG today.

## 2024-08-06 NOTE — Assessment & Plan Note (Signed)
 GERD symptoms are managed with famotidine , which may contribute to throat drainage and ear discomfort. Continue famotidine  for GERD management.

## 2024-08-06 NOTE — Assessment & Plan Note (Signed)
 Liver function tests are elevated but stable. Prefers to monitor liver function. Ordered complete metabolic panel to monitor liver function and continue collaboration and recommendations from GI.

## 2024-08-06 NOTE — Assessment & Plan Note (Signed)
 Persistent pain may be related to sinus issues, with differential diagnosis including sinusitis or other ENT-related conditions. Referred to ENT for further evaluation. Continue flonase  and loratadine  daily. She had dental x-rays which were negative.

## 2024-08-06 NOTE — Patient Instructions (Signed)
 It was great to see you!  We are checking your labs today and will let you know the results via mychart/phone.   Keep drinking plenty of fluids  Monitor how often you are having palpitations   I am placing a referral to ENT   Let's follow-up in 3 months, sooner if you have concerns.  If a referral was placed today, you will be contacted for an appointment. Please note that routine referrals can sometimes take up to 3-4 weeks to process. Please call our office if you haven't heard anything after this time frame.  Take care,  Tinnie Harada, NP

## 2024-08-06 NOTE — Progress Notes (Signed)
 Established Patient Office Visit  Subjective   Patient ID: Lisa Spence, female    DOB: Aug 28, 1951  Age: 73 y.o. MRN: [id number]  Chief Complaint  Patient presents with   Medication Management    Rx refills, referral for ENT, Flu Vaccine    HPI Discussed the use of AI scribe software for clinical note transcription with the patient, who gave verbal consent to proceed.  History of Present Illness   Lisa Spence is a 73 year old female who presents with ongoing right ear and facial pain.  She experiences persistent pain in her right ear and the right side of her face. Dental x-rays are normal, ruling out dental causes. She is taking flonase  and claritin  daily. She would like a referral to ENT.   She has heart palpitations occurring three to four times a week, lasting for a short duration, sometimes accompanied by lightheadedness. There is no shortness of breath or dizziness. This started happening over the last few months. She has been under increased stress as her SIL passed away and she has other family/friends with cancer.   She has a weak urine stream ongoing for about a year, with no pain, burning, or pelvic pressure. She continues to urinate regularly, including at night.  She actively manages her diet and exercise, noting weight loss and regular physical activity.      ROS See pertinent positives and negatives per HPI.    Objective:     BP 114/78 (BP Location: Right Arm, Patient Position: Sitting, Cuff Size: Normal)   Pulse 77   Temp 97.7 F (36.5 C)   Ht 5' 6 (1.676 m)   Wt 177 lb 6.4 oz (80.5 kg)   SpO2 95%   BMI 28.63 kg/m  BP Readings from Last 3 Encounters:  08/06/24 114/78  05/22/24 124/72  05/06/24 118/80   Wt Readings from Last 3 Encounters:  08/06/24 177 lb 6.4 oz (80.5 kg)  06/27/24 170 lb (77.1 kg)  05/06/24 178 lb 3.2 oz (80.8 kg)      Physical Exam Vitals and nursing note reviewed.  Constitutional:      General: She is not in acute  distress.    Appearance: Normal appearance.  HENT:     Head: Normocephalic.     Right Ear: Tympanic membrane, ear canal and external ear normal.     Left Ear: Tympanic membrane, ear canal and external ear normal.     Mouth/Throat:     Mouth: Mucous membranes are moist.     Pharynx: No posterior oropharyngeal erythema.  Eyes:     Conjunctiva/sclera: Conjunctivae normal.  Cardiovascular:     Rate and Rhythm: Normal rate and regular rhythm.     Pulses: Normal pulses.     Heart sounds: Normal heart sounds.  Pulmonary:     Effort: Pulmonary effort is normal.     Breath sounds: Normal breath sounds.  Musculoskeletal:     Cervical back: Normal range of motion and neck supple. No tenderness.  Lymphadenopathy:     Cervical: No cervical adenopathy.  Skin:    General: Skin is warm.  Neurological:     General: No focal deficit present.     Mental Status: She is alert and oriented to person, place, and time.  Psychiatric:        Mood and Affect: Mood normal.        Behavior: Behavior normal.        Thought Content: Thought content normal.  Judgment: Judgment normal.      Assessment & Plan:   Problem List Items Addressed This Visit       Digestive   GERD (gastroesophageal reflux disease)   GERD symptoms are managed with famotidine , which may contribute to throat drainage and ear discomfort. Continue famotidine  for GERD management.        Other   Anxiety   Chronic, stable. High anxiety levels due to family stressors are effectively managed with clonazepam  0.5mg  BID prn and venlafaxine  150mg  daily. Refill sent to the pharmacy. PDMP reviewed.       Relevant Medications   clonazePAM  (KLONOPIN ) 0.5 MG tablet   Elevated LFTs   Liver function tests are elevated but stable. Prefers to monitor liver function. Ordered complete metabolic panel to monitor liver function and continue collaboration and recommendations from GI.       Weak urinary stream   Chronic weak urinary  stream is present without pain or retention, and no recent medication changes. Continue to monitor urinary symptoms. Consider pelvic ultrasound if symptoms persist. U/A negative for UTI.       Relevant Orders   POCT urinalysis dipstick (Completed)   Palpitations - Primary   Intermittent palpitations are possibly stress-related, with no shortness of breath or dizziness. Blood pressure is well-controlled. Ordered thyroid  function tests to rule out thyroid -related causes. Monitor frequency and duration of palpitations. She declines EKG today.       Relevant Orders   Comprehensive metabolic panel with GFR   CBC with Differential/Platelet   TSH   Chronic right ear pain   Persistent pain may be related to sinus issues, with differential diagnosis including sinusitis or other ENT-related conditions. Referred to ENT for further evaluation. Continue flonase  and loratadine  daily. She had dental x-rays which were negative.       Relevant Medications   clonazePAM  (KLONOPIN ) 0.5 MG tablet   Other Relevant Orders   Ambulatory referral to ENT   Other Visit Diagnoses       Immunization due       Flu vaccine given today   Relevant Orders   Flu vaccine HIGH DOSE PF(Fluzone Trivalent) (Completed)       Return in about 3 months (around 11/06/2024) for follow-up.    Tinnie DELENA Harada, NP

## 2024-08-06 NOTE — Assessment & Plan Note (Signed)
 Chronic weak urinary stream is present without pain or retention, and no recent medication changes. Continue to monitor urinary symptoms. Consider pelvic ultrasound if symptoms persist. U/A negative for UTI.

## 2024-08-06 NOTE — Assessment & Plan Note (Signed)
 Chronic, stable. High anxiety levels due to family stressors are effectively managed with clonazepam  0.5mg  BID prn and venlafaxine  150mg  daily. Refill sent to the pharmacy. PDMP reviewed.

## 2024-08-20 ENCOUNTER — Encounter (INDEPENDENT_AMBULATORY_CARE_PROVIDER_SITE_OTHER): Payer: Self-pay

## 2024-09-25 ENCOUNTER — Other Ambulatory Visit: Payer: Self-pay | Admitting: Nurse Practitioner

## 2024-09-26 ENCOUNTER — Ambulatory Visit (INDEPENDENT_AMBULATORY_CARE_PROVIDER_SITE_OTHER)

## 2024-09-26 ENCOUNTER — Ambulatory Visit: Admitting: Podiatry

## 2024-09-26 VITALS — Ht 66.0 in | Wt 177.0 lb

## 2024-09-26 DIAGNOSIS — B351 Tinea unguium: Secondary | ICD-10-CM

## 2024-09-26 DIAGNOSIS — M2041 Other hammer toe(s) (acquired), right foot: Secondary | ICD-10-CM | POA: Diagnosis not present

## 2024-09-26 DIAGNOSIS — M7752 Other enthesopathy of left foot: Secondary | ICD-10-CM | POA: Diagnosis not present

## 2024-09-26 DIAGNOSIS — M2042 Other hammer toe(s) (acquired), left foot: Secondary | ICD-10-CM

## 2024-09-26 DIAGNOSIS — M79674 Pain in right toe(s): Secondary | ICD-10-CM

## 2024-09-26 DIAGNOSIS — M79675 Pain in left toe(s): Secondary | ICD-10-CM

## 2024-09-26 DIAGNOSIS — M25572 Pain in left ankle and joints of left foot: Secondary | ICD-10-CM

## 2024-09-26 MED ORDER — EFINACONAZOLE 10 % EX SOLN: 1.0000 "application " | Freq: Every day | CUTANEOUS | 2 refills | Status: AC

## 2024-09-26 NOTE — Telephone Encounter (Signed)
 Requesting: VENLAFAXINE  ER 150MG  CAPSULES  Last Visit: 08/06/2024 Next Visit: 11/25/2024 Last Refill: 03/05/2024  Please Advise

## 2024-09-27 NOTE — Progress Notes (Signed)
 Subjective: Chief Complaint  Patient presents with   Nail Problem    RM 13 RFC/ New left Ankle x-rays.     74 year old female presents the office today for concerns of thick, elongated toenails that she can has difficulty trimming herself for nail fungus.    She was x-rayed today for ankle to make sure things change.  She issignificant discomfort at the ankle no recent injuries that she reports.  Objective: AAO x3, NAD DP/PT pulses palpable bilaterally, CRT less than 3 seconds Nails are hypertrophic, dystrophic, brittle, discolored, elongated 10. No surrounding redness or drainage. Tenderness nails 1-5 bilaterally.  There is minimal callus formation on the medial first MPJ bilaterally without any underlying ulceration drainage or signs of infection.  Minimal hyperkeratotic lesion submetatarsal 4 left foot without any underlying ulceration drainage or any signs of infection. Hammertoe contractures noted to digits. Able to palpate the hardware in the lateral aspect of the ankle.  No significant pain associate with this there is no edema, erythema.  Ankle joint range of motion intact. There is no pain identified to the left ankle. No edema.  No pain with calf compression, swelling, warmth, erythema  Assessment: Symptomatic onychomycosis, hyperkeratotic lesions, hammertoes  Plan: Symptomatic onychomycosis -Sharply to the nails x10 without any complications or bleeding.  Discussed topical medication.  Continue Jublia   Bunion, hammertoe deformities - We discussed exercises, good supportive shoes to help with the deformities progressing.  History of left ankle fracture - X-rays obtained reviewed.  Multiple views obtained.  No evidence of acute fracture.  Hardware intact uncomplicated fractures.  Minimal joint space narrowing most of the medial gutter. - Continue to monitor.  Currently asymptomatic.  Return in about 3 months (around 12/25/2024).  Lisa Spence DPM

## 2024-09-29 ENCOUNTER — Other Ambulatory Visit: Payer: Self-pay | Admitting: Gastroenterology

## 2024-09-29 ENCOUNTER — Other Ambulatory Visit: Payer: Self-pay | Admitting: Nurse Practitioner

## 2024-09-30 NOTE — Telephone Encounter (Signed)
 Requesting: ALBUTEROL  HFA INH (200 PUFFS) 6.7GM  Last Visit: 08/06/2024 Next Visit: 11/25/2024 Last Refill: 10/30/2023  Please Advise

## 2024-10-27 ENCOUNTER — Other Ambulatory Visit (HOSPITAL_COMMUNITY): Payer: Self-pay

## 2024-10-27 ENCOUNTER — Telehealth: Payer: Self-pay

## 2024-10-27 NOTE — Telephone Encounter (Signed)
 Prolia  VOB initiated via [email or web address]  Next Prolia  inj DUE: 11/22/24

## 2024-10-28 ENCOUNTER — Other Ambulatory Visit (HOSPITAL_COMMUNITY): Payer: Self-pay

## 2024-10-28 NOTE — Telephone Encounter (Signed)
 Lisa Spence

## 2024-10-28 NOTE — Telephone Encounter (Signed)
 MEDICAL PA SUBMITTED VIA [employer]. Authorization Number : [id number]    PHARMACY: PLAN/BENEFIT EXCLUSION

## 2024-10-29 NOTE — Telephone Encounter (Signed)
 Pt ready for scheduling for PROLIA  on or after : 11/22/24  Option# 1: Buy/Bill (Office supplied medication)  Out-of-pocket cost due at time of clinic visit: $377  Number of injection/visits approved: 2  Primary: [employer] Prolia  co-insurance: 20% Admin fee co-insurance: 20%  Secondary: --- Prolia  co-insurance:  Admin fee co-insurance:   Medical Benefit Details: Date Benefits were checked: 10/27/24 Deductible: NO/ Coinsurance: 20%/ Admin Fee: 20%  Prior Auth: APPROVED PA# [id number] Expiration Date: 10/28/24-10/28/25   # of doses approved: 2 ----------------------------------------------------------------------- Option# 2- Med Obtained from pharmacy:  Pharmacy benefit: Copay $--- (Paid to pharmacy) Admin Fee: --- (Pay at clinic)  Prior Auth: PLAN/BENEFIT EXCLUSION  PA# Expiration Date:   # of doses approved:   If patient wants fill through the pharmacy benefit please send prescription to: ---, and include estimated need by date in rx notes. Pharmacy will ship medication directly to the office.  Patient NOT eligible for Prolia  Copay Card. Copay Card can make patient's cost as little as $25. Link to apply: [email or web address]  ** This summary of benefits is an estimation of the patient's out-of-pocket cost. Exact cost may very based on individual plan coverage.

## 2024-11-25 ENCOUNTER — Ambulatory Visit: Admitting: Nurse Practitioner

## 2024-11-26 ENCOUNTER — Ambulatory Visit

## 2024-12-26 ENCOUNTER — Ambulatory Visit: Admitting: Podiatry

## 2025-07-03 ENCOUNTER — Ambulatory Visit
